# Patient Record
Sex: Male | Born: 1980
Health system: Southern US, Community
[De-identification: ages and names within clinical notes are randomized; demographics above are authoritative.]

## PROBLEM LIST (undated history)

## (undated) DIAGNOSIS — I10 Essential (primary) hypertension: Secondary | ICD-10-CM

## (undated) HISTORY — PX: OTHER SURGICAL HISTORY: SHX169

---
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
## 1999-08-11 ENCOUNTER — Emergency Department (HOSPITAL_COMMUNITY): Admission: EM | Admit: 1999-08-11 | Discharge: 1999-08-11 | Payer: Self-pay | Admitting: Emergency Medicine

## 2002-02-15 ENCOUNTER — Emergency Department (HOSPITAL_COMMUNITY): Admission: AC | Admit: 2002-02-15 | Discharge: 2002-02-15 | Payer: Self-pay

## 2002-02-15 ENCOUNTER — Encounter: Payer: Self-pay | Admitting: Emergency Medicine

## 2002-04-04 ENCOUNTER — Emergency Department (HOSPITAL_COMMUNITY): Admission: EM | Admit: 2002-04-04 | Discharge: 2002-04-04 | Payer: Self-pay | Admitting: Emergency Medicine

## 2002-04-04 ENCOUNTER — Encounter: Payer: Self-pay | Admitting: Emergency Medicine

## 2003-05-20 ENCOUNTER — Emergency Department (HOSPITAL_COMMUNITY): Admission: EM | Admit: 2003-05-20 | Discharge: 2003-05-20 | Payer: Self-pay | Admitting: Emergency Medicine

## 2005-01-19 ENCOUNTER — Emergency Department (HOSPITAL_COMMUNITY): Admission: EM | Admit: 2005-01-19 | Discharge: 2005-01-19 | Payer: Self-pay | Admitting: Emergency Medicine

## 2015-07-29 ENCOUNTER — Emergency Department (HOSPITAL_COMMUNITY)
Admission: EM | Admit: 2015-07-29 | Discharge: 2015-07-29 | Disposition: A | Discharge status: Home / Self Care | Payer: Self-pay | Attending: Emergency Medicine | Admitting: Emergency Medicine

## 2015-07-29 ENCOUNTER — Encounter (HOSPITAL_COMMUNITY): Payer: Self-pay | Admitting: Family Medicine

## 2015-07-29 DIAGNOSIS — F17200 Nicotine dependence, unspecified, uncomplicated: Secondary | ICD-10-CM | POA: Insufficient documentation

## 2015-07-29 DIAGNOSIS — I10 Essential (primary) hypertension: Secondary | ICD-10-CM | POA: Insufficient documentation

## 2015-07-29 MED ORDER — HYDROCHLOROTHIAZIDE 25 MG PO TABS
25.0000 mg | ORAL_TABLET | Freq: Once | ORAL | Status: AC
  Administered 2015-07-29: 25 mg via ORAL
  Filled 2015-07-29: qty 1

## 2015-07-29 MED ORDER — HYDROCHLOROTHIAZIDE 25 MG PO TABS: 25.0000 mg | ORAL_TABLET | Freq: Every day | ORAL | Status: DC

## 2015-07-29 NOTE — ED Provider Notes (Signed)
CSN: [id number]     Arrival date & time 07/29/15  1234 History  By signing my name below, I, Nicole Kindred, attest that this documentation has been prepared under the direction and in the presence of [address] [employer].   Electronically Signed: Nicole Kindred, ED Scribe 07/29/2015 at 1:13 PM.  Chief Complaint  Patient presents with  . Hypertension   Patient is a 35 y.o. male presenting with hypertension. The history is provided by the patient. No language interpreter was used.  Hypertension This is a recurrent problem. The current episode started more than 1 week ago. The problem occurs constantly. The problem has not changed since onset.Pertinent negatives include no chest pain, no abdominal pain, no headaches and no shortness of breath. Nothing aggravates the symptoms. Nothing relieves the symptoms. He has tried nothing for the symptoms. The treatment provided no relief.   HPI Comments: Russell Hernandez is a 35 y.o. male with a  PMHx of HTN (no longer on BP meds), who presents to the Emergency Department complaining of gradual onset hypertension, onset in the last week. Pt states he used to be on aspirin, lisinopril, and hydrochlorothiazide 3years ago but was told to stop taking them while in prison. He was just released from prison in February and hasn't established care with anyone since getting out. He noticed he started having intermittent headaches and intermittent chest tightness so he checked his BP at [facility] and it was 188/140, so he told his father who advised him to seek medical attention. He is not currently experiencing any symptoms today. No other associated symptoms noted. States that he has been eating saltier foods since getting out of prison. Hasn't tried anything for his symptoms. Pt denies headache, blurry vision, syncope, light-headedness, fevers, chills, chest pain, shortness of breath, nausea, vomiting, abdominal pain, diarrhea, constipation, dysuria, hematuria,  numbness, tingling, weakness, rash, body aches, or any other pertinent symptoms.    History reviewed. No pertinent past medical history. History reviewed. No pertinent past surgical history. History reviewed. No pertinent family history. Social History  Substance Use Topics  . Smoking status: Current Every Day Smoker  . Smokeless tobacco: None  . Alcohol Use: Yes    Review of Systems  Constitutional: Negative for fever and chills.  Eyes: Negative for visual disturbance.  Respiratory: Negative for shortness of breath.   Cardiovascular: Negative for chest pain.  Gastrointestinal: Negative for nausea, vomiting, abdominal pain, diarrhea and constipation.  Genitourinary: Negative for dysuria and hematuria.  Musculoskeletal: Negative for myalgias and arthralgias.  Skin: Negative for rash.  Allergic/Immunologic: Negative for immunocompromised state.  Neurological: Negative for syncope, weakness, light-headedness, numbness and headaches.  Psychiatric/Behavioral: Negative for confusion.   A complete 10 system review of systems was obtained and all systems are negative except as noted in the HPI and PMH.   Allergies  Review of patient's allergies indicates no known allergies.  Home Medications   Prior to Admission medications   Not on File   BP 163/127 mmHg  Pulse 79  Temp(Src) 98.6 F (37 C) (Oral)  Resp 18  Ht 6\' 1"  (1.854 m)  Wt 227 lb (102.967 kg)  BMI 29.96 kg/m2  SpO2 97% Physical Exam  Constitutional: He is oriented to person, place, and time. Vital signs are normal. He appears well-developed and well-nourished.  Non-toxic appearance. No distress.  Afebrile, nontoxic, NAD  HENT:  Head: Normocephalic and atraumatic.  Mouth/Throat: Oropharynx is clear and moist and mucous membranes are normal.  Eyes: Conjunctivae and EOM are  normal. Pupils are equal, round, and reactive to light. Right eye exhibits no discharge. Left eye exhibits no discharge.  PERRL, EOMI, no  nystagmus, no visual field deficits   Neck: Normal range of motion. Neck supple. No rigidity. Normal range of motion present.  Cardiovascular: Normal rate, regular rhythm, normal heart sounds and intact distal pulses.  Exam reveals no gallop and no friction rub.   No murmur heard. Pulmonary/Chest: Effort normal and breath sounds normal. No respiratory distress. He has no decreased breath sounds. He has no wheezes. He has no rhonchi. He has no rales.  Abdominal: Soft. Normal appearance and bowel sounds are normal. He exhibits no distension. There is no tenderness. There is no rigidity, no rebound, no guarding, no CVA tenderness, no tenderness at McBurney's point and negative Murphy's sign.  Musculoskeletal: Normal range of motion.  MAE x4 Strength and sensation grossly intact Distal pulses intact Gait steady  Neurological: He is alert and oriented to person, place, and time. He has normal strength. No cranial nerve deficit or sensory deficit. Coordination and gait normal. GCS eye subscore is 4. GCS verbal subscore is 5. GCS motor subscore is 6.  CN 2-12 grossly intact A&O x4 GCS 15 Sensation and strength intact Coordination with finger-to-nose WNL Neg pronator drift   Skin: Skin is warm, dry and intact. No rash noted.  Psychiatric: He has a normal mood and affect.  Nursing note and vitals reviewed.   ED Course  Procedures (including critical care time) DIAGNOSTIC STUDIES: Oxygen Saturation is 97% on RA, normal by my interpretation.    COORDINATION OF CARE: 1:13 PM Discussed treatment plan with pt at bedside and pt agreed to plan.  Labs Review Labs Reviewed - No data to display  Imaging Review No results found.   EKG Interpretation None      MDM   Final diagnoses:  HTN (hypertension), benign    35 y.o. male here with asymptomatic HTN. Got out of prison 3 months ago and hasn't followed up since then, was on BP meds while he was in prison but then they took him off of  them. Since getting out, has been eating saltier diet and noticed he started having increasing BPs. Asymptomatic today, although he's had some intermittent headaches. Neuro exam benign, no focal findings on exam, doubt need for emergent work up of his asymptomatic HTN. BP 163/127 here. Doubt malignant HTN or HTN urgency/emergency. Will start on low-dose HCTZ since he was previously on this. Will hold off on starting lisinopril which he also used to take, will have him f/up with [facility] in 1-2wks to establish care and for ongoing management of his HTN. Discussed DASH diet. I explained the diagnosis and have given explicit precautions to return to the ER including for any other new or worsening symptoms. The patient understands and accepts the medical plan as it's been dictated and I have answered their questions. Discharge instructions concerning home care and prescriptions have been given. The patient is STABLE and is discharged to home in good condition.   I personally performed the services described in this documentation, which was scribed in my presence. The recorded information has been reviewed and is accurate.  BP 163/127 mmHg  Pulse 79  Temp(Src) 98.6 F (37 C) (Oral)  Resp 18  Ht 6\' 1"  (1.854 m)  Wt 102.967 kg  BMI 29.96 kg/m2  SpO2 97%  Meds ordered this encounter  Medications  . hydrochlorothiazide (HYDRODIURIL) 25 MG tablet    Sig: Take 1  tablet (25 mg total) by mouth daily.    Dispense:  30 tablet    Refill:  0    Order Specific Question:  Supervising Provider    Answer:  Noemi Chapel [[id number]]      Floyd Lusignan Camprubi-Soms, PA-C 07/29/15 1326  Jola Schmidt, MD 07/29/15 1429

## 2015-07-29 NOTE — Discharge Instructions (Signed)
Eat a low-salt diet. Take blood pressure medications daily. Keep a log of your blood pressures at home, take it each morning and each night and write it down to show to the doctor when you follow up. Follow up with [facility] in the next 1-2 weeks to establish care and for ongoing management of your blood pressure. Return to the ER for changes or worsening symptoms.   Hypertension Hypertension, commonly called high blood pressure, is when the force of blood pumping through your arteries is too strong. Your arteries are the blood vessels that carry blood from your heart throughout your body. A blood pressure reading consists of a higher number over a lower number, such as 110/72. The higher number (systolic) is the pressure inside your arteries when your heart pumps. The lower number (diastolic) is the pressure inside your arteries when your heart relaxes. Ideally you want your blood pressure below 120/80. Hypertension forces your heart to work harder to pump blood. Your arteries may become narrow or stiff. Having untreated or uncontrolled hypertension can cause heart attack, stroke, kidney disease, and other problems. RISK FACTORS Some risk factors for high blood pressure are controllable. Others are not.  Risk factors you cannot control include:   Race. You may be at higher risk if you are African American.  Age. Risk increases with age.  Gender. Men are at higher risk than women before age 74 years. After age 11, women are at higher risk than men. Risk factors you can control include:  Not getting enough exercise or physical activity.  Being overweight.  Getting too much fat, sugar, calories, or salt in your diet.  Drinking too much alcohol. SIGNS AND SYMPTOMS Hypertension does not usually cause signs or symptoms. Extremely high blood pressure (hypertensive crisis) may cause headache, anxiety, shortness of breath, and nosebleed. DIAGNOSIS To check if you have hypertension,  your health care provider will measure your blood pressure while you are seated, with your arm held at the level of your heart. It should be measured at least twice using the same arm. Certain conditions can cause a difference in blood pressure between your right and left arms. A blood pressure reading that is higher than normal on one occasion does not mean that you need treatment. If it is not clear whether you have high blood pressure, you may be asked to return on a different day to have your blood pressure checked again. Or, you may be asked to monitor your blood pressure at home for 1 or more weeks. TREATMENT Treating high blood pressure includes making lifestyle changes and possibly taking medicine. Living a healthy lifestyle can help lower high blood pressure. You may need to change some of your habits. Lifestyle changes may include:  Following the DASH diet. This diet is high in fruits, vegetables, and whole grains. It is low in salt, red meat, and added sugars.  Keep your sodium intake below 2,300 mg per day.  Getting at least 30-45 minutes of aerobic exercise at least 4 times per week.  Losing weight if necessary.  Not smoking.  Limiting alcoholic beverages.  Learning ways to reduce stress. Your health care provider may prescribe medicine if lifestyle changes are not enough to get your blood pressure under control, and if one of the following is true:  You are 53-94 years of age and your systolic blood pressure is above 140.  You are 84 years of age or older, and your systolic blood pressure is above 150.  Your diastolic blood pressure is above 90.  You have diabetes, and your systolic blood pressure is over [id number] or your diastolic blood pressure is over 90.  You have kidney disease and your blood pressure is above 140/90.  You have heart disease and your blood pressure is above 140/90. Your personal target blood pressure may vary depending on your medical conditions, your  age, and other factors. HOME CARE INSTRUCTIONS  Have your blood pressure rechecked as directed by your health care provider.   Take medicines only as directed by your health care provider. Follow the directions carefully. Blood pressure medicines must be taken as prescribed. The medicine does not work as well when you skip doses. Skipping doses also puts you at risk for problems.  Do not smoke.   Monitor your blood pressure at home as directed by your health care provider. SEEK MEDICAL CARE IF:   You think you are having a reaction to medicines taken.  You have recurrent headaches or feel dizzy.  You have swelling in your ankles.  You have trouble with your vision. SEEK IMMEDIATE MEDICAL CARE IF:  You develop a severe headache or confusion.  You have unusual weakness, numbness, or feel faint.  You have severe chest or abdominal pain.  You vomit repeatedly.  You have trouble breathing. MAKE SURE YOU:   Understand these instructions.  Will watch your condition.  Will get help right away if you are not doing well or get worse.   This information is not intended to replace advice given to you by your health care provider. Make sure you discuss any questions you have with your health care provider.   Document Released: 02/28/2005 Document Revised: 07/15/2014 Document Reviewed: 12/21/2012 Elsevier Interactive Patient Education 2016 [employer].  How to Take Your Blood Pressure HOW DO I GET A BLOOD PRESSURE MACHINE?  You can buy an electronic home blood pressure machine at your local pharmacy. Insurance will sometimes cover the cost if you have a prescription.  Ask your doctor what type of machine is best for you. There are different machines for your arm and your wrist.  If you decide to buy a machine to check your blood pressure on your arm, first check the size of your arm so you can buy the right size cuff. To check the size of your arm:   Use a measuring tape  that shows both inches and centimeters.   Wrap the measuring tape around the upper-middle part of your arm. You may need someone to help you measure.   Write down your arm measurement in both inches and centimeters.   To measure your blood pressure correctly, it is important to have the right size cuff.   If your arm is up to 13 inches (up to 34 centimeters), get an adult cuff size.  If your arm is 13 to 17 inches (35 to 44 centimeters), get a large adult cuff size.    If your arm is 17 to 20 inches (45 to 52 centimeters), get an adult thigh cuff.  WHAT DO THE NUMBERS MEAN?   There are two numbers that make up your blood pressure. For example: 120/80.  The first number (120 in our example) is called the "systolic pressure." It is a measure of the pressure in your blood vessels when your heart is pumping blood.  The second number (80 in our example) is called the "diastolic pressure." It is a measure of the pressure in your blood vessels when your  heart is resting between beats.  Your doctor will tell you what your blood pressure should be. WHAT SHOULD I DO BEFORE I CHECK MY BLOOD PRESSURE?   Try to rest or relax for at least 30 minutes before you check your blood pressure.  Do not smoke.  Do not have any drinks with caffeine, such as:  Soda.  Coffee.  Tea.  Check your blood pressure in a quiet room.  Sit down and stretch out your arm on a table. Keep your arm at about the level of your heart. Let your arm relax.  Make sure that your legs are not crossed. HOW DO I CHECK MY BLOOD PRESSURE?  Follow the directions that came with your machine.  Make sure you remove any tight-fitting clothing from your arm or wrist. Wrap the cuff around your upper arm or wrist. You should be able to fit a finger between the cuff and your arm. If you cannot fit a finger between the cuff and your arm, it is too tight and should be removed and rewrapped.  Some units require you to manually  pump up the arm cuff.  Automatic units inflate the cuff when you press a button.  Cuff deflation is automatic in both models.  After the cuff is inflated, the unit measures your blood pressure and pulse. The readings are shown on a monitor. Hold still and breathe normally while the cuff is inflated.  Getting a reading takes less than a minute.  Some models store readings in a memory. Some provide a printout of readings. If your machine does not store your readings, keep a written record.  Take readings with you to your next visit with your doctor.   This information is not intended to replace advice given to you by your health care provider. Make sure you discuss any questions you have with your health care provider.   Document Released: 02/11/2008 Document Revised: 03/21/2014 Document Reviewed: 04/25/2013 Elsevier Interactive Patient Education 2016 [place] DASH stands for "Dietary Approaches to Stop Hypertension." The DASH eating plan is a healthy eating plan that has been shown to reduce high blood pressure (hypertension). Additional health benefits may include reducing the risk of type 2 diabetes mellitus, heart disease, and stroke. The DASH eating plan may also help with weight loss. WHAT DO I NEED TO KNOW ABOUT THE DASH EATING PLAN? For the DASH eating plan, you will follow these general guidelines:  Choose foods with a percent daily value for sodium of less than 5% (as listed on the food label).  Use salt-free seasonings or herbs instead of table salt or sea salt.  Check with your health care provider or pharmacist before using salt substitutes.  Eat lower-sodium products, often labeled as "lower sodium" or "no salt added."  Eat fresh foods.  Eat more vegetables, fruits, and low-fat dairy products.  Choose whole grains. Look for the word "whole" as the first word in the ingredient list.  Choose fish and skinless chicken or [place] more often than  red meat. Limit fish, poultry, and meat to 6 oz (170 g) each day.  Limit sweets, desserts, sugars, and sugary drinks.  Choose heart-healthy fats.  Limit cheese to 1 oz (28 g) per day.  Eat more home-cooked food and less restaurant, buffet, and fast food.  Limit fried foods.  Cook foods using methods other than frying.  Limit canned vegetables. If you do use them, rinse them well to decrease the sodium.  When  eating at a restaurant, ask that your food be prepared with less salt, or no salt if possible. WHAT FOODS CAN I EAT? Seek help from a dietitian for individual calorie needs. Grains Whole grain or whole wheat bread. Brown rice. Whole grain or whole wheat pasta. Quinoa, bulgur, and whole grain cereals. Low-sodium cereals. Corn or whole wheat flour tortillas. Whole grain cornbread. Whole grain crackers. Low-sodium crackers. Vegetables Fresh or frozen vegetables (raw, steamed, roasted, or grilled). Low-sodium or reduced-sodium tomato and vegetable juices. Low-sodium or reduced-sodium tomato sauce and paste. Low-sodium or reduced-sodium canned vegetables.  Fruits All fresh, canned (in natural juice), or frozen fruits. Meat and Other Protein Products Ground beef (85% or leaner), grass-fed beef, or beef trimmed of fat. Skinless chicken or [place]. Ground chicken or [place]. Pork trimmed of fat. All fish and seafood. Eggs. Dried beans, peas, or lentils. Unsalted nuts and seeds. Unsalted canned beans. Dairy Low-fat dairy products, such as skim or 1% milk, 2% or reduced-fat cheeses, low-fat ricotta or cottage cheese, or plain low-fat yogurt. Low-sodium or reduced-sodium cheeses. Fats and Oils Tub margarines without trans fats. Light or reduced-fat mayonnaise and salad dressings (reduced sodium). Avocado. Safflower, olive, or canola oils. Natural peanut or almond butter. Other Unsalted popcorn and pretzels. The items listed above may not be a complete list of recommended foods or beverages.  Contact your dietitian for more options. WHAT FOODS ARE NOT RECOMMENDED? Grains White bread. White pasta. White rice. Refined cornbread. Bagels and croissants. Crackers that contain trans fat. Vegetables Creamed or fried vegetables. Vegetables in a cheese sauce. Regular canned vegetables. Regular canned tomato sauce and paste. Regular tomato and vegetable juices. Fruits Dried fruits. Canned fruit in light or heavy syrup. Fruit juice. Meat and Other Protein Products Fatty cuts of meat. Ribs, chicken wings, bacon, sausage, bologna, salami, chitterlings, fatback, hot dogs, bratwurst, and packaged luncheon meats. Salted nuts and seeds. Canned beans with salt. Dairy Whole or 2% milk, cream, half-and-half, and cream cheese. Whole-fat or sweetened yogurt. Full-fat cheeses or blue cheese. Nondairy creamers and whipped toppings. Processed cheese, cheese spreads, or cheese curds. Condiments Onion and garlic salt, seasoned salt, table salt, and sea salt. Canned and packaged gravies. Worcestershire sauce. Tartar sauce. Barbecue sauce. Teriyaki sauce. Soy sauce, including reduced sodium. Steak sauce. Fish sauce. Oyster sauce. Cocktail sauce. Horseradish. Ketchup and mustard. Meat flavorings and tenderizers. Bouillon cubes. Hot sauce. Tabasco sauce. Marinades. Taco seasonings. Relishes. Fats and Oils Butter, stick margarine, lard, shortening, ghee, and bacon fat. Coconut, palm kernel, or palm oils. Regular salad dressings. Other Pickles and olives. Salted popcorn and pretzels. The items listed above may not be a complete list of foods and beverages to avoid. Contact your dietitian for more information. WHERE CAN I FIND MORE INFORMATION? National Heart, Lung, and Blood Institute: travelstabloid.com   This information is not intended to replace advice given to you by your health care provider. Make sure you discuss any questions you have with your health care provider.     Document Released: 02/17/2011 Document Revised: 03/21/2014 Document Reviewed: 01/02/2013 Elsevier Interactive Patient Education 2016 [place] Your High Blood Pressure Blood pressure is a measurement of how forceful your blood is pressing against the walls of the arteries. Arteries are muscular tubes within the circulatory system. Blood pressure does not stay the same. Blood pressure rises when you are active, excited, or nervous; and it lowers during sleep and relaxation. If the numbers measuring your blood pressure stay above normal most of the time, you  are at risk for health problems. High blood pressure (hypertension) is a long-term (chronic) condition in which blood pressure is elevated. A blood pressure reading is recorded as two numbers, such as 120 over 80 (or 120/80). The first, higher number is called the systolic pressure. It is a measure of the pressure in your arteries as the heart beats. The second, lower number is called the diastolic pressure. It is a measure of the pressure in your arteries as the heart relaxes between beats.  Keeping your blood pressure in a normal range is important to your overall health and prevention of health problems, such as heart disease and stroke. When your blood pressure is uncontrolled, your heart has to work harder than normal. High blood pressure is a very common condition in adults because blood pressure tends to rise with age. Men and women are equally likely to have hypertension but at different times in life. Before age 51, men are more likely to have hypertension. After 36 years of age, women are more likely to have it. Hypertension is especially common in African Americans. This condition often has no signs or symptoms. The cause of the condition is usually not known. Your caregiver can help you come up with a plan to keep your blood pressure in a normal, healthy range. BLOOD PRESSURE STAGES Blood pressure is classified into four  stages: normal, prehypertension, stage 1, and stage 2. Your blood pressure reading will be used to determine what type of treatment, if any, is necessary. Appropriate treatment options are tied to these four stages:  Normal  Systolic pressure (mm Hg): below 120.  Diastolic pressure (mm Hg): below 80. Prehypertension  Systolic pressure (mm Hg): 120 to 139.  Diastolic pressure (mm Hg): 80 to 89. Stage1  Systolic pressure (mm Hg): 140 to 159.  Diastolic pressure (mm Hg): 90 to 99. Stage2  Systolic pressure (mm Hg): 160 or above.  Diastolic pressure (mm Hg): 100 or above. RISKS RELATED TO HIGH BLOOD PRESSURE Managing your blood pressure is an important responsibility. Uncontrolled high blood pressure can lead to:  A heart attack.  A stroke.  A weakened blood vessel (aneurysm).  Heart failure.  Kidney damage.  Eye damage.  Metabolic syndrome.  Memory and concentration problems. HOW TO MANAGE YOUR BLOOD PRESSURE Blood pressure can be managed effectively with lifestyle changes and medicines (if needed). Your caregiver will help you come up with a plan to bring your blood pressure within a normal range. Your plan should include the following: Education  Read all information provided by your caregivers about how to control blood pressure.  Educate yourself on the latest guidelines and treatment recommendations. New research is always being done to further define the risks and treatments for high blood pressure. Lifestylechanges  Control your weight.  Avoid smoking.  Stay physically active.  Reduce the amount of salt in your diet.  Reduce stress.  Control any chronic conditions, such as high cholesterol or diabetes.  Reduce your alcohol intake. Medicines  Several medicines (antihypertensive medicines) are available, if needed, to bring blood pressure within a normal range. Communication  Review all the medicines you take with your caregiver because there may  be side effects or interactions.  Talk with your caregiver about your diet, exercise habits, and other lifestyle factors that may be contributing to high blood pressure.  See your caregiver regularly. Your caregiver can help you create and adjust your plan for managing high blood pressure. RECOMMENDATIONS FOR TREATMENT AND FOLLOW-UP  The following  recommendations are based on current guidelines for managing high blood pressure in nonpregnant adults. Use these recommendations to identify the proper follow-up period or treatment option based on your blood pressure reading. You can discuss these options with your caregiver.  Systolic pressure of [id number] to [id number] or diastolic pressure of 80 to 89: Follow up with your caregiver as directed.  Systolic pressure of [id number] to 0000000 or diastolic pressure of 90 to 100: Follow up with your caregiver within 2 months.  Systolic pressure above 0000000 or diastolic pressure above [id number]: Follow up with your caregiver within 1 month.  Systolic pressure above [id number] or diastolic pressure above [id number]: Consider antihypertensive therapy; follow up with your caregiver within 1 week.  Systolic pressure above [id number] or diastolic pressure above 123456: Begin antihypertensive therapy; follow up with your caregiver within 1 week.   This information is not intended to replace advice given to you by your health care provider. Make sure you discuss any questions you have with your health care provider.   Document Released: 11/23/2011 Document Reviewed: 11/23/2011 Elsevier Interactive Patient Education [employer].

## 2015-07-29 NOTE — ED Notes (Addendum)
Pt here sts that he checked his BP and it was 188/140 at [facility]. Ss some intermittent HA and family hx of HTN. sts that he was taking HTN meds 3 years ago.

## 2015-08-02 ENCOUNTER — Emergency Department (HOSPITAL_COMMUNITY)
Admission: EM | Admit: 2015-08-02 | Discharge: 2015-08-03 | Disposition: A | Discharge status: Home / Self Care | Payer: Self-pay | Attending: Emergency Medicine | Admitting: Emergency Medicine

## 2015-08-02 ENCOUNTER — Emergency Department (HOSPITAL_COMMUNITY): Payer: Self-pay

## 2015-08-02 ENCOUNTER — Encounter (HOSPITAL_COMMUNITY): Payer: Self-pay | Admitting: Emergency Medicine

## 2015-08-02 DIAGNOSIS — F17200 Nicotine dependence, unspecified, uncomplicated: Secondary | ICD-10-CM | POA: Insufficient documentation

## 2015-08-02 DIAGNOSIS — R079 Chest pain, unspecified: Secondary | ICD-10-CM

## 2015-08-02 DIAGNOSIS — Z79899 Other long term (current) drug therapy: Secondary | ICD-10-CM | POA: Insufficient documentation

## 2015-08-02 DIAGNOSIS — R05 Cough: Secondary | ICD-10-CM | POA: Insufficient documentation

## 2015-08-02 DIAGNOSIS — R0789 Other chest pain: Secondary | ICD-10-CM | POA: Insufficient documentation

## 2015-08-02 DIAGNOSIS — I10 Essential (primary) hypertension: Secondary | ICD-10-CM | POA: Insufficient documentation

## 2015-08-02 DIAGNOSIS — R110 Nausea: Secondary | ICD-10-CM | POA: Insufficient documentation

## 2015-08-02 DIAGNOSIS — R51 Headache: Secondary | ICD-10-CM | POA: Insufficient documentation

## 2015-08-02 HISTORY — DX: Essential (primary) hypertension: I10

## 2015-08-02 LAB — RAPID URINE DRUG SCREEN, HOSP PERFORMED
Amphetamines: NOT DETECTED
BENZODIAZEPINES: NOT DETECTED
Barbiturates: NOT DETECTED
COCAINE: NOT DETECTED
OPIATES: NOT DETECTED
Tetrahydrocannabinol: POSITIVE — AB

## 2015-08-02 LAB — BASIC METABOLIC PANEL
ANION GAP: 12 (ref 5–15)
BUN: 19 mg/dL (ref 6–20)
CALCIUM: 9 mg/dL (ref 8.9–10.3)
CHLORIDE: 97 mmol/L — AB (ref 101–111)
CO2: 25 mmol/L (ref 22–32)
CREATININE: 1.49 mg/dL — AB (ref 0.61–1.24)
GFR calc non Af Amer: 59 mL/min — ABNORMAL LOW (ref >60)
Glucose, Bld: 137 mg/dL — ABNORMAL HIGH (ref 65–99)
Potassium: 4.7 mmol/L (ref 3.5–5.1)
SODIUM: 134 mmol/L — AB (ref 135–145)

## 2015-08-02 LAB — CBC
HCT: 54.2 % — ABNORMAL HIGH (ref 39.0–52.0)
HEMOGLOBIN: 18.8 g/dL — AB (ref 13.0–17.0)
MCH: 30.4 pg (ref 26.0–34.0)
MCHC: 34.7 g/dL (ref 30.0–36.0)
MCV: 87.7 fL (ref 78.0–100.0)
PLATELETS: 269 10*3/uL (ref 150–400)
RBC: 6.18 MIL/uL — AB (ref 4.22–5.81)
RDW: 14.7 % (ref 11.5–15.5)
WBC: 7.7 10*3/uL (ref 4.0–10.5)

## 2015-08-02 LAB — I-STAT TROPONIN, ED: TROPONIN I, POC: 0 ng/mL (ref 0.00–0.08)

## 2015-08-02 MED ORDER — HYDROCHLOROTHIAZIDE 25 MG PO TABS
25.0000 mg | ORAL_TABLET | Freq: Once | ORAL | Status: AC
  Administered 2015-08-02: 25 mg via ORAL
  Filled 2015-08-02: qty 1

## 2015-08-02 MED ORDER — LISINOPRIL 20 MG PO TABS: 20.0000 mg | ORAL_TABLET | Freq: Every day | ORAL | Status: DC

## 2015-08-02 MED ORDER — ONDANSETRON 4 MG PO TBDP
4.0000 mg | ORAL_TABLET | Freq: Once | ORAL | Status: AC
Start: 2015-08-03 — End: 2015-08-03
  Administered 2015-08-03: 4 mg via ORAL
  Filled 2015-08-02: qty 1

## 2015-08-02 MED ORDER — ONDANSETRON HCL 4 MG/2ML IJ SOLN: 4.0000 mg | Freq: Once | INTRAMUSCULAR | Status: DC

## 2015-08-02 MED ORDER — ONDANSETRON 4 MG PO TBDP: 4.0000 mg | ORAL_TABLET | Freq: Three times a day (TID) | ORAL | Status: DC | PRN

## 2015-08-02 MED ORDER — ACETAMINOPHEN 325 MG PO TABS
650.0000 mg | ORAL_TABLET | Freq: Once | ORAL | Status: AC
  Administered 2015-08-02: 650 mg via ORAL
  Filled 2015-08-02: qty 2

## 2015-08-02 NOTE — Discharge Instructions (Signed)
Continue taking hydrochlorothiazide as prescribed. In addition take lisinopril as prescribed today. Follow up with a primary care doctor for further management of your high blood pressure.

## 2015-08-02 NOTE — ED Provider Notes (Signed)
CSN: [id number]     Arrival date & time 08/02/15  1842 History   First MD Initiated Contact with Patient 08/02/15 1957     Chief Complaint  Patient presents with  . Chest Pain   HPI Patient is a 35 year old male with past medical history of hypertension who presents with contract concern for continued hypertension despite antihypertensive medications. Patient reports that he was recently incarcerated and states that during that time he stopped taking his blood pressure medications (lisinopril and HCTZ) because his blood pressure had normalized. After discharge patient reported having intermittent spells of various symptoms including nausea, chest tightness, headaches and dizziness. He is evaluated here in the emergency department on 07/29/15 for similar symptoms and was started on low-dose hydrochlorothiazide at that time. Patient reports she's been taking his medicine as directed but continues to have blood pressures at home in the 160s over 100s. He was also advised to follow-up with primary care but he has not called or made an appointment to do this yet. Today patient experienced worsening nausea and presented to the emergency department for evaluation. Over the past few weeks he reports intermittent episodes of headaches, dizziness and chest tightness. Denies chest pain or shortness of breath at this time.  Past Medical History  Diagnosis Date  . Hypertension    History reviewed. No pertinent past surgical history. No family history on file. Social History  Substance Use Topics  . Smoking status: Current Every Day Smoker  . Smokeless tobacco: None  . Alcohol Use: Yes    Review of Systems  Constitutional: Negative for fever and chills.  HENT: Negative for congestion and rhinorrhea.   Eyes: Negative for visual disturbance.  Respiratory: Positive for cough and chest tightness.   Cardiovascular: Positive for chest pain.  Gastrointestinal: Positive for nausea. Negative for vomiting,  abdominal pain, diarrhea and constipation.  Genitourinary: Negative for dysuria and difficulty urinating.  Musculoskeletal: Negative for back pain and neck pain.  Skin: Negative for pallor and rash.  Neurological: Positive for dizziness and headaches.  Psychiatric/Behavioral: Negative for confusion.      Allergies  Review of patient's allergies indicates no known allergies.  Home Medications   Prior to Admission medications   Medication Sig Start Date End Date Taking? Authorizing Provider  hydrochlorothiazide (HYDRODIURIL) 25 MG tablet Take 1 tablet (25 mg total) by mouth daily. 07/29/15   Mercedes Camprubi-Soms, PA-C   BP 160/119 mmHg  Pulse 99  Temp(Src) 98.1 F (36.7 C) (Oral)  Resp 16  SpO2 96% Physical Exam  Constitutional: He is oriented to person, place, and time. He appears well-developed and well-nourished.  HENT:  Head: Normocephalic and atraumatic.  Eyes: EOM are normal. Pupils are equal, round, and reactive to light.  Neck: Normal range of motion. Neck supple.  Cardiovascular: Normal rate, regular rhythm and intact distal pulses.   Pulmonary/Chest: Effort normal and breath sounds normal. No respiratory distress.  Abdominal: Soft. He exhibits no distension. There is no tenderness.  Musculoskeletal: Normal range of motion. He exhibits no edema or tenderness.  Neurological: He is alert and oriented to person, place, and time.  Skin: Skin is warm and dry. No rash noted.  Psychiatric: He has a normal mood and affect.  Nursing note and vitals reviewed.   ED Course  Procedures (including critical care time) Labs Review Labs Reviewed  BASIC METABOLIC PANEL - Abnormal; Notable for the following:    Sodium 134 (*)    Chloride 97 (*)    Glucose, Bld 137 (*)  Creatinine, Ser 1.49 (*)    GFR calc non Af Amer 59 (*)    All other components within normal limits  CBC - Abnormal; Notable for the following:    RBC 6.18 (*)    Hemoglobin 18.8 (*)    HCT 54.2 (*)     All other components within normal limits  URINE RAPID DRUG SCREEN, HOSP PERFORMED - Abnormal; Notable for the following:    Tetrahydrocannabinol POSITIVE (*)    All other components within normal limits  I-STAT TROPOININ, ED    Imaging Review No results found. I have personally reviewed and evaluated these images and lab results as part of my medical decision-making.   EKG Interpretation   Date/Time:  Sunday Aug 02 2015 18:46:08 EDT Ventricular Rate:  100 PR Interval:  144 QRS Duration: 84 QT Interval:  356 QTC Calculation: 459 R Axis:   48 Text Interpretation:  Normal sinus rhythm Septal infarct , age  undetermined Abnormal ECG No old tracing to compare abnormal T wave in the  inferior leads Confirmed by MILLER  MD, BRIAN ([id number]) on 08/02/2015 7:55:51  PM      MDM   Final diagnoses:  Essential hypertension  Chest pain, unspecified chest pain type  Nausea   Patient is a 35 year old male with past medical history of hypertension who presents for continued hypertension despite starting antihypertensives 5 days ago. Present patient is afebrile, hypertensive with initial blood pressure 160/119 with normal heart and respiratory rates. Exam is benign. No JVD, heart murmur or lower extremity edema appreciated. EKG without acute ischemic changes. Initial troponin is undetectable. Labs significant for mildly elevated creatinine at 1.49 and UDS positive for THC. Patient was treated with Tylenol and Zofran with improvement in symptoms. Additional dose of hydrochlorothiazide given.   As patient is not receiving optimal blood pressure control on one agent will restart lisinopril today as patient has been on on the past. Discussed with patient that he needs to follow up with a primary care provider for further management and following of his blood pressure, as well as recheck of his labs including creatinine. Patient was discharged in stable condition. Prescription for lisinopril and small  amount of Zofran given. Patient referred to primary care clinic and reports he will follow up there for reevaluation. Strict return precautions were discussed and patient is in agreement with plan.  Patient seen and discussed with Dr. Sabra Heck, ED attending   Gibson Ramp, MD 08/03/15 [id number]  Noemi Chapel, MD 08/03/15 [phone]

## 2015-08-02 NOTE — ED Notes (Signed)
C/o intermittent pressure to L chest with sob and nausea x 3 days.  Also reports hypertension.  States he was seen here on 5/17 for same.

## 2015-08-02 NOTE — ED Provider Notes (Signed)
The patient is a 35 year old male, he does have a history of hypertension, he was recently incarcerated and states that during that time he stopped taking his medication because his blood pressure had normalized. When he was released he noted that he was having intermittent spells of symptoms including nausea, chest pains, headaches and dizziness. He does not have any of that except for nausea at this time. On exam the patient does have some hypertension, his heart sounds and lung sounds are normal, he does not have any edema or JVD and appears very well. His EKG is slightly abnormal which I suspect is related to that persistent hypertension. I discussed with the patient the treatment options and at this time he will be treated with additional antihypertensives including lisinopril in addition to the hydrochlorothiazide is and he is in agreement with this. He will also get some Zofran. The rest of his labs are unremarkable. He understands the need for very close follow-up.  I saw and evaluated the patient, reviewed the resident's note and I agree with the findings and plan.   EKG Interpretation  Date/Time:  Sunday Aug 02 2015 18:46:08 EDT Ventricular Rate:  100 PR Interval:  144 QRS Duration: 84 QT Interval:  356 QTC Calculation: 459 R Axis:   48 Text Interpretation:  Normal sinus rhythm Septal infarct , age undetermined Abnormal ECG No old tracing to compare abnormal T wave in the inferior leads Confirmed by Mahi Zabriskie  MD, Mahaila Tischer ([id number]) on 08/02/2015 7:55:51 PM      I personally interpreted the EKG as well as the resident and agree with the interpretation on the resident's chart.  Final diagnoses:  Essential hypertension  Chest pain, unspecified chest pain type  Nausea      Noemi Chapel, MD 08/03/15 [phone]

## 2015-08-02 NOTE — ED Notes (Signed)
Pt has known hypertension, states he has been taking medications as prescribed.

## 2015-08-03 NOTE — ED Notes (Signed)
Pt verbalized understanding of discharge instructions and follow up care

## 2015-12-03 ENCOUNTER — Encounter (HOSPITAL_COMMUNITY): Payer: Self-pay | Admitting: Emergency Medicine

## 2015-12-03 ENCOUNTER — Ambulatory Visit (HOSPITAL_COMMUNITY)
Admission: EM | Admit: 2015-12-03 | Discharge: 2015-12-03 | Disposition: A | Discharge status: Home / Self Care | Payer: Self-pay | Attending: Internal Medicine | Admitting: Internal Medicine

## 2015-12-03 ENCOUNTER — Ambulatory Visit (INDEPENDENT_AMBULATORY_CARE_PROVIDER_SITE_OTHER): Payer: Self-pay

## 2015-12-03 DIAGNOSIS — R0982 Postnasal drip: Secondary | ICD-10-CM

## 2015-12-03 DIAGNOSIS — J9801 Acute bronchospasm: Secondary | ICD-10-CM

## 2015-12-03 DIAGNOSIS — J069 Acute upper respiratory infection, unspecified: Secondary | ICD-10-CM

## 2015-12-03 DIAGNOSIS — B9789 Other viral agents as the cause of diseases classified elsewhere: Principal | ICD-10-CM

## 2015-12-03 MED ORDER — PHENYLEPHRINE-CHLORPHEN-DM 10-4-12.5 MG/5ML PO LIQD: 5.0000 mL | ORAL | 0 refills | Status: DC | PRN

## 2015-12-03 MED ORDER — ALBUTEROL SULFATE HFA 108 (90 BASE) MCG/ACT IN AERS: 2.0000 | INHALATION_SPRAY | RESPIRATORY_TRACT | 0 refills | Status: DC | PRN

## 2015-12-03 NOTE — ED Triage Notes (Signed)
Pt here for cold sx associated w/cough, CP, runny nose, congestion   Smokes 0.5 PPD  A&O x4... NAD

## 2015-12-03 NOTE — Discharge Instructions (Signed)
Use the albuterol inhaler 2 puffs every 4 hours as needed for cough and wheeze. Take the prescription cough medicine to help minimize drainage, cough and congestion. This may cause drowsiness. Cepacol lozenges as needed for sore throat. Ibuprofen 600 mg every 6 hours as needed for discomfort, fever or throat pain. Drink plenty of fluids and stay well-hydrated. Stop smoking.

## 2015-12-03 NOTE — ED Provider Notes (Signed)
CSN: [id number]     Arrival date & time 12/03/15  1301 History   First MD Initiated Contact with Patient 12/03/15 1347     Chief Complaint  Patient presents with  . URI   (Consider location/radiation/quality/duration/timing/severity/associated sxs/prior Treatment) 35 year old male who smokes half a pack cigarettes per day presents to the urgent care with a one-day history of cough, anterior chest pain associated with cough only, minor sore throat, PND, runny nose, upper rest were congestion. Denies earache, sore throat. He states that he believes that he had a fever sometime in the past 24 hours. Today he is afebrile.      Past Medical History:  Diagnosis Date  . Hypertension    History reviewed. No pertinent surgical history. History reviewed. No pertinent family history. Social History  Substance Use Topics  . Smoking status: Current Every Day Smoker    Packs/day: 0.50    Types: Cigarettes  . Smokeless tobacco: Never Used  . Alcohol use Yes    Review of Systems  Constitutional: Positive for activity change, fatigue and fever.  HENT: Positive for congestion, postnasal drip, rhinorrhea and sore throat. Negative for ear pain.   Eyes: Negative.   Respiratory: Positive for cough. Negative for shortness of breath.   Cardiovascular: Positive for chest pain.  Gastrointestinal: Negative.     Allergies  Onion  Home Medications   Prior to Admission medications   Medication Sig Start Date End Date Taking? Authorizing Provider  albuterol (PROVENTIL HFA;VENTOLIN HFA) 108 (90 Base) MCG/ACT inhaler Inhale 2 puffs into the lungs every 4 (four) hours as needed for wheezing or shortness of breath. 12/03/15   Janne Napoleon, NP  hydrochlorothiazide (HYDRODIURIL) 25 MG tablet Take 1 tablet (25 mg total) by mouth daily. 07/29/15   Mercedes Camprubi-Soms, PA-C  ibuprofen (ADVIL,MOTRIN) 200 MG tablet Take 200 mg by mouth every 6 (six) hours as needed for headache (pain).    Historical Provider, MD   lisinopril (PRINIVIL,ZESTRIL) 20 MG tablet Take 1 tablet (20 mg total) by mouth daily. 08/02/15 09/02/15  Gibson Ramp, MD  ondansetron (ZOFRAN ODT) 4 MG disintegrating tablet Take 1 tablet (4 mg total) by mouth every 8 (eight) hours as needed for nausea or vomiting. 08/02/15   Gibson Ramp, MD  Phenylephrine-Chlorphen-DM 12-16-10.5 MG/5ML LIQD Take 5 mLs by mouth every 4 (four) hours as needed. 12/03/15   Janne Napoleon, NP   Meds Ordered and Administered this Visit  Medications - No data to display  BP 150/94 (BP Location: Right Arm)   Pulse 77   Temp 98.1 F (36.7 C) (Oral)   Resp 12   SpO2 96%  No data found.   Physical Exam  Constitutional: He is oriented to person, place, and time. He appears well-developed and well-nourished. No distress.  HENT:  Head: Normocephalic and atraumatic.  Mouth/Throat: No oropharyngeal exudate.  Bilateral TMs are normal. Oropharynx with minimal erythema, cobblestoning and moderate amount of clear PND. Clear rhinorrhea.  Eyes: EOM are normal. Pupils are equal, round, and reactive to light.  Neck: Normal range of motion. Neck supple.  Cardiovascular: Normal rate, regular rhythm, normal heart sounds and intact distal pulses.   Pulmonary/Chest: Effort normal.  Forced expiration reveals faint distant bilateral wheeze. No crackles. No apparent increase in effort.  Musculoskeletal: Normal range of motion. He exhibits no edema.  Lymphadenopathy:    He has no cervical adenopathy.  Neurological: He is alert and oriented to person, place, and time.  Skin: Skin is warm and dry.  Psychiatric: He has a normal mood and affect.  Nursing note and vitals reviewed.   Urgent Care Course   Clinical Course    Procedures (including critical care time)  Labs Review Labs Reviewed - No data to display  Imaging Review Dg Chest 2 View  Result Date: 12/03/2015 CLINICAL DATA:  Chest pain and cough.  Fever. EXAM: CHEST  2 VIEW COMPARISON:  Aug 02, 2015 FINDINGS: There is  no edema or consolidation. The heart size and pulmonary vascularity are normal. No adenopathy. No pneumothorax. No bone lesions. IMPRESSION: No edema or consolidation. Electronically Signed   By: Lowella Grip III M.D.   On: 12/03/2015 14:32     Visual Acuity Review  Right Eye Distance:   Left Eye Distance:   Bilateral Distance:    Right Eye Near:   Left Eye Near:    Bilateral Near:         MDM   1. Viral URI with cough   2. Cough due to bronchospasm   3. PND (post-nasal drip)    Use the albuterol inhaler 2 puffs every 4 hours as needed for cough and wheeze. Take the prescription cough medicine to help minimize drainage, cough and congestion. This may cause drowsiness. Cepacol lozenges as needed for sore throat. Ibuprofen 600 mg every 6 hours as needed for discomfort, fever or throat pain. Drink plenty of fluids and stay well-hydrated. Stop smoking Meds ordered this encounter  Medications  . Phenylephrine-Chlorphen-DM 12-16-10.5 MG/5ML LIQD    Sig: Take 5 mLs by mouth every 4 (four) hours as needed.    Dispense:  120 mL    Refill:  0    Order Specific Question:   Supervising Provider    Answer:   Sherlene Shams [id number]  . albuterol (PROVENTIL HFA;VENTOLIN HFA) 108 (90 Base) MCG/ACT inhaler    Sig: Inhale 2 puffs into the lungs every 4 (four) hours as needed for wheezing or shortness of breath.    Dispense:  1 Inhaler    Refill:  0    Order Specific Question:   Supervising Provider    Answer:   Sherlene Shams [id number]       Janne Napoleon, NP 12/03/15 [phone]

## 2015-12-19 ENCOUNTER — Ambulatory Visit (HOSPITAL_COMMUNITY)
Admission: EM | Admit: 2015-12-19 | Discharge: 2015-12-19 | Disposition: A | Discharge status: Home / Self Care | Payer: Self-pay | Attending: Internal Medicine | Admitting: Internal Medicine

## 2015-12-19 ENCOUNTER — Encounter (HOSPITAL_COMMUNITY): Payer: Self-pay | Admitting: Emergency Medicine

## 2015-12-19 DIAGNOSIS — I10 Essential (primary) hypertension: Secondary | ICD-10-CM

## 2015-12-19 DIAGNOSIS — J069 Acute upper respiratory infection, unspecified: Secondary | ICD-10-CM

## 2015-12-19 MED ORDER — KETOROLAC TROMETHAMINE 60 MG/2ML IM SOLN
INTRAMUSCULAR | Status: AC
  Filled 2015-12-19: qty 2

## 2015-12-19 MED ORDER — LOSARTAN POTASSIUM 50 MG PO TABS: 50.0000 mg | ORAL_TABLET | Freq: Every day | ORAL | 2 refills | Status: DC

## 2015-12-19 MED ORDER — HYDROCHLOROTHIAZIDE 25 MG PO TABS: 25.0000 mg | ORAL_TABLET | Freq: Every day | ORAL | 2 refills | Status: DC

## 2015-12-19 MED ORDER — KETOROLAC TROMETHAMINE 60 MG/2ML IM SOLN
60.0000 mg | Freq: Once | INTRAMUSCULAR | Status: AC
  Administered 2015-12-19: 60 mg via INTRAMUSCULAR

## 2015-12-19 MED ORDER — HYDROCOD POLST-CPM POLST ER 10-8 MG/5ML PO SUER: 5.0000 mL | Freq: Two times a day (BID) | ORAL | 0 refills | Status: AC | PRN

## 2015-12-19 NOTE — ED Triage Notes (Addendum)
Reports a headache, particularly involving the back of his head.  Patient woke today with a panic feeling.  Patient reports coughing up green phlegm.

## 2015-12-19 NOTE — ED Provider Notes (Signed)
CSN: [id number]     Arrival date & time 12/19/15  1445 History   First MD Initiated Contact with Patient 12/19/15 1701     Chief Complaint  Patient presents with  . Headache  . Cough   (Consider location/radiation/quality/duration/timing/severity/associated sxs/prior Treatment) Russell Hernandez is a well-appearing 35 y.o male, with history of HTN, presents today for headache and wanting medication refills for his BP medication. He reports taking HCTZ 25mg  and was taking Lisinopril but reports being allergic to Lisinopril and would like something different. He haven't been taking his BP medications for 2 months. He believes the headache is either from the HTN or from his cold. He also endorses cold symptoms for the past 2 days with congestion, running nose, sinus pressure, sneezing and sore throat. Headache started 2 days ago as well. He has no nausea and no photosensitivity. He also denies fever, CP, SOB, blurry vision.       Past Medical History:  Diagnosis Date  . Hypertension    History reviewed. No pertinent surgical history. No family history on file. Social History  Substance Use Topics  . Smoking status: Current Every Day Smoker    Packs/day: 0.50    Types: Cigarettes  . Smokeless tobacco: Never Used  . Alcohol use Yes    Review of Systems  Constitutional: Positive for fatigue. Negative for chills and fever.  HENT: Positive for congestion, rhinorrhea, sinus pressure, sneezing and sore throat. Negative for ear pain.   Eyes: Negative for photophobia and visual disturbance.  Respiratory: Positive for cough. Negative for shortness of breath.        +productive cough  Cardiovascular: Negative for chest pain, palpitations and leg swelling.  Gastrointestinal: Negative for abdominal pain, diarrhea, nausea and vomiting.  Musculoskeletal: Positive for myalgias.  Neurological: Positive for headaches. Negative for dizziness and weakness.    Allergies  Onion  Home Medications    Prior to Admission medications   Medication Sig Start Date End Date Taking? Authorizing Provider  albuterol (PROVENTIL HFA;VENTOLIN HFA) 108 (90 Base) MCG/ACT inhaler Inhale 2 puffs into the lungs every 4 (four) hours as needed for wheezing or shortness of breath. 12/03/15   Janne Napoleon, NP  chlorpheniramine-HYDROcodone (TUSSIONEX PENNKINETIC ER) 10-8 MG/5ML SUER Take 5 mLs by mouth every 12 (twelve) hours as needed for cough. 12/19/15 12/26/15  Barry Dienes, NP  hydrochlorothiazide (HYDRODIURIL) 25 MG tablet Take 1 tablet (25 mg total) by mouth daily. 12/19/15   Barry Dienes, NP  ibuprofen (ADVIL,MOTRIN) 200 MG tablet Take 200 mg by mouth every 6 (six) hours as needed for headache (pain).    Historical Provider, MD  losartan (COZAAR) 50 MG tablet Take 1 tablet (50 mg total) by mouth daily. 12/19/15   Barry Dienes, NP  ondansetron (ZOFRAN ODT) 4 MG disintegrating tablet Take 1 tablet (4 mg total) by mouth every 8 (eight) hours as needed for nausea or vomiting. 08/02/15   Gibson Ramp, MD  Phenylephrine-Chlorphen-DM 12-16-10.5 MG/5ML LIQD Take 5 mLs by mouth every 4 (four) hours as needed. 12/03/15   Janne Napoleon, NP   Meds Ordered and Administered this Visit   Medications  ketorolac (TORADOL) injection 60 mg (60 mg Intramuscular Given 12/19/15 1738)    BP 166/99 (BP Location: Right Arm)   Pulse 65   Temp 98.6 F (37 C) (Oral)   Resp 16   Ht 6\' 1"  (1.854 m)   Wt 220 lb (99.8 kg)   SpO2 100%   BMI 29.03 kg/m  No data found.  Physical Exam  Constitutional: He is oriented to person, place, and time. He appears well-developed and well-nourished.  HENT:  Head: Normocephalic and atraumatic.  Right Ear: External ear normal.  Left Ear: External ear normal.  Nose: Nose normal.  Mouth/Throat: Oropharynx is clear and moist. No oropharyngeal exudate.  TM pearly gray bilaterally  Eyes: Conjunctivae and EOM are normal. Pupils are equal, round, and reactive to light.  Neck: Normal range of motion. Neck  supple.  Cardiovascular: Normal rate, regular rhythm and normal heart sounds.   Pulmonary/Chest: Effort normal and breath sounds normal. No respiratory distress. He has no wheezes.  Abdominal: Soft. Bowel sounds are normal. He exhibits no distension. There is no tenderness.  Neurological: He is alert and oriented to person, place, and time. No cranial nerve deficit. Coordination normal.  Skin: Skin is warm and dry.  Psychiatric: He has a normal mood and affect.  Nursing note and vitals reviewed.   Urgent Care Course   Clinical Course    Procedures (including critical care time)  Labs Review Labs Reviewed - No data to display  Imaging Review No results found.   MDM   1. Essential hypertension   2. Upper respiratory tract infection, unspecified type     Physical examination unremarkable and is neurologically intact. He is well-appearing. Patient is safe to be discharge home. His BP is elevated today. He ran out of his BP medications 2 months ago. Will refill HCTZ 25mg . Will switch Lisinopril to Losartan 50mg  daily. Patient informed to establish care with a PCP and follow up next week. Patient also have signs and symptoms are most consistent with a diagnosis of URI. Patient educated that antibiotic would not be helpful since this is a viral illness. Treatment is supportive. Patient encouraged to rest, drink plenty of fluid, use Motrin/Tylenol as needed at appropriate dose for pain/fever. Prescription cough syrup given. Tramadol 60 mg IM given for headache and headache did improved at discharge. Informed to go to ER if his headache worsen or if he starts to have SOB, CP, Blurry vision, dizziness or other new symptoms.         Barry Dienes, NP 12/19/15 [phone]

## 2016-11-26 ENCOUNTER — Emergency Department (HOSPITAL_COMMUNITY)
Admission: EM | Admit: 2016-11-26 | Discharge: 2016-11-27 | Disposition: A | Discharge status: Home / Self Care | Payer: No Typology Code available for payment source | Attending: Emergency Medicine | Admitting: Emergency Medicine

## 2016-11-26 ENCOUNTER — Emergency Department (HOSPITAL_COMMUNITY): Payer: No Typology Code available for payment source

## 2016-11-26 ENCOUNTER — Encounter (HOSPITAL_COMMUNITY): Payer: Self-pay | Admitting: Emergency Medicine

## 2016-11-26 DIAGNOSIS — I10 Essential (primary) hypertension: Secondary | ICD-10-CM | POA: Insufficient documentation

## 2016-11-26 DIAGNOSIS — Z79899 Other long term (current) drug therapy: Secondary | ICD-10-CM | POA: Diagnosis not present

## 2016-11-26 DIAGNOSIS — F17210 Nicotine dependence, cigarettes, uncomplicated: Secondary | ICD-10-CM | POA: Diagnosis not present

## 2016-11-26 DIAGNOSIS — M542 Cervicalgia: Secondary | ICD-10-CM | POA: Insufficient documentation

## 2016-11-26 MED ORDER — CYCLOBENZAPRINE HCL 10 MG PO TABS
10.0000 mg | ORAL_TABLET | Freq: Once | ORAL | Status: AC
  Administered 2016-11-26: 10 mg via ORAL
  Filled 2016-11-26: qty 1

## 2016-11-26 MED ORDER — OXYCODONE-ACETAMINOPHEN 5-325 MG PO TABS
1.0000 | ORAL_TABLET | Freq: Once | ORAL | Status: AC
  Administered 2016-11-26: 1 via ORAL
  Filled 2016-11-26: qty 1
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
## 2016-11-27 MED ORDER — OXYCODONE-ACETAMINOPHEN 5-325 MG PO TABS: 1.0000 | ORAL_TABLET | ORAL | 0 refills | Status: DC | PRN

## 2016-11-27 MED ORDER — CYCLOBENZAPRINE HCL 10 MG PO TABS: 10.0000 mg | ORAL_TABLET | Freq: Two times a day (BID) | ORAL | 0 refills | Status: DC | PRN

## 2016-11-27 NOTE — Discharge Instructions (Signed)
Your imaging today showed no evidence of acute fracture or dislocation. Please use the pain medicine and muscle relaxants to help with her symptoms.Please follow-up with a primary care physician for further management. If any symptoms change or worsen, please return to the nearest emergency department.

## 2016-11-29 ENCOUNTER — Encounter (HOSPITAL_COMMUNITY): Payer: Self-pay | Admitting: Emergency Medicine

## 2016-11-29 ENCOUNTER — Ambulatory Visit (HOSPITAL_COMMUNITY)
Admission: EM | Admit: 2016-11-29 | Discharge: 2016-11-29 | Disposition: A | Discharge status: Home / Self Care | Payer: Self-pay | Attending: Family Medicine | Admitting: Family Medicine

## 2016-11-29 DIAGNOSIS — T148XXA Other injury of unspecified body region, initial encounter: Secondary | ICD-10-CM

## 2016-11-29 MED ORDER — MELOXICAM 15 MG PO TABS: 15.0000 mg | ORAL_TABLET | Freq: Every day | ORAL | 0 refills | Status: DC

## 2016-11-29 MED ORDER — KETOROLAC TROMETHAMINE 60 MG/2ML IM SOLN
INTRAMUSCULAR | Status: AC
  Filled 2016-11-29: qty 2

## 2016-11-29 MED ORDER — KETOROLAC TROMETHAMINE 60 MG/2ML IM SOLN
60.0000 mg | Freq: Once | INTRAMUSCULAR | Status: AC
  Administered 2016-11-29: 60 mg via INTRAMUSCULAR
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
## 2016-11-29 NOTE — Discharge Instructions (Signed)
Your exam was most consistent with muscle strain. Start Mobic as directed. Flexeril as needed at night. Flexeril can make you drowsy, so do not take if you are going to drive, operate heavy machinery, or make important decisions. Ice/heat compresses as needed. This can take up to 3-4 weeks to completely resolve, but you should be feeling better each week. Follow up here or with PCP if symptoms worsen, changes for reevaluation. If experience numbness/tingling of the inner thighs, loss of bladder or bowel control, go to the emergency department for evaluation.

## 2016-12-10 ENCOUNTER — Emergency Department (HOSPITAL_COMMUNITY): Payer: Self-pay

## 2016-12-10 ENCOUNTER — Emergency Department (HOSPITAL_COMMUNITY)
Admission: EM | Admit: 2016-12-10 | Discharge: 2016-12-10 | Disposition: A | Discharge status: Home / Self Care | Payer: Self-pay | Attending: Emergency Medicine | Admitting: Emergency Medicine

## 2016-12-10 ENCOUNTER — Other Ambulatory Visit: Payer: Self-pay

## 2016-12-10 ENCOUNTER — Encounter (HOSPITAL_COMMUNITY): Payer: Self-pay | Admitting: Emergency Medicine

## 2016-12-10 DIAGNOSIS — R51 Headache: Secondary | ICD-10-CM | POA: Insufficient documentation

## 2016-12-10 DIAGNOSIS — I10 Essential (primary) hypertension: Secondary | ICD-10-CM | POA: Insufficient documentation

## 2016-12-10 DIAGNOSIS — F17210 Nicotine dependence, cigarettes, uncomplicated: Secondary | ICD-10-CM | POA: Insufficient documentation

## 2016-12-10 DIAGNOSIS — Z9114 Patient's other noncompliance with medication regimen: Secondary | ICD-10-CM | POA: Insufficient documentation

## 2016-12-10 DIAGNOSIS — Z79899 Other long term (current) drug therapy: Secondary | ICD-10-CM | POA: Insufficient documentation

## 2016-12-10 DIAGNOSIS — I160 Hypertensive urgency: Secondary | ICD-10-CM | POA: Insufficient documentation

## 2016-12-10 LAB — BASIC METABOLIC PANEL
Anion gap: 9 (ref 5–15)
BUN: 10 mg/dL (ref 6–20)
CALCIUM: 9.6 mg/dL (ref 8.9–10.3)
CO2: 25 mmol/L (ref 22–32)
CREATININE: 1.23 mg/dL (ref 0.61–1.24)
Chloride: 102 mmol/L (ref 101–111)
GFR calc Af Amer: >60 mL/min (ref >60)
GLUCOSE: 106 mg/dL — AB (ref 65–99)
Potassium: 4.1 mmol/L (ref 3.5–5.1)
SODIUM: 136 mmol/L (ref 135–145)

## 2016-12-10 LAB — I-STAT TROPONIN, ED
TROPONIN I, POC: 0.01 ng/mL (ref 0.00–0.08)
Troponin i, poc: 0.02 ng/mL (ref 0.00–0.08)

## 2016-12-10 LAB — CBC
HCT: 48.8 % (ref 39.0–52.0)
Hemoglobin: 16.4 g/dL (ref 13.0–17.0)
MCH: 29.5 pg (ref 26.0–34.0)
MCHC: 33.6 g/dL (ref 30.0–36.0)
MCV: 87.8 fL (ref 78.0–100.0)
PLATELETS: 327 10*3/uL (ref 150–400)
RBC: 5.56 MIL/uL (ref 4.22–5.81)
RDW: 14.6 % (ref 11.5–15.5)
WBC: 8.2 10*3/uL (ref 4.0–10.5)

## 2016-12-10 MED ORDER — LABETALOL HCL 5 MG/ML IV SOLN: 20.0000 mg | Freq: Once | INTRAVENOUS | Status: DC

## 2016-12-10 MED ORDER — HYDROCHLOROTHIAZIDE 25 MG PO TABS: 25.0000 mg | ORAL_TABLET | Freq: Every day | ORAL | 2 refills | Status: DC

## 2016-12-10 MED ORDER — HYDROCHLOROTHIAZIDE 25 MG PO TABS
25.0000 mg | ORAL_TABLET | Freq: Every day | ORAL | Status: DC
  Administered 2016-12-10: 25 mg via ORAL
  Filled 2016-12-10: qty 1

## 2016-12-10 MED ORDER — LISINOPRIL 10 MG PO TABS: 10.0000 mg | ORAL_TABLET | Freq: Every day | ORAL | 0 refills | Status: DC

## 2016-12-10 MED ORDER — ACETAMINOPHEN 325 MG PO TABS
650.0000 mg | ORAL_TABLET | Freq: Once | ORAL | Status: AC
  Administered 2016-12-10: 650 mg via ORAL
  Filled 2016-12-10: qty 2

## 2016-12-10 MED ORDER — LISINOPRIL 10 MG PO TABS
10.0000 mg | ORAL_TABLET | Freq: Once | ORAL | Status: AC
  Administered 2016-12-10: 10 mg via ORAL
  Filled 2016-12-10: qty 1

## 2016-12-10 MED ORDER — ONDANSETRON HCL 4 MG/2ML IJ SOLN
4.0000 mg | Freq: Once | INTRAMUSCULAR | Status: AC
  Administered 2016-12-10: 4 mg via INTRAVENOUS
  Filled 2016-12-10: qty 2

## 2016-12-10 MED ORDER — LABETALOL HCL 5 MG/ML IV SOLN
20.0000 mg | Freq: Once | INTRAVENOUS | Status: AC
  Administered 2016-12-10: 20 mg via INTRAVENOUS
  Filled 2016-12-10: qty 4

## 2016-12-10 NOTE — Discharge Instructions (Signed)
Please return to the ER if you have worsening chest pain, shortness of breath, pain radiating to your jaw, shoulder, or back, sweats or fainting. Otherwise see a primary care doctor for optimal care.

## 2016-12-10 NOTE — ED Provider Notes (Signed)
[facility] DEPT Provider Note   CSN: [id number] Arrival date & time: 12/10/16  1238     History   Chief Complaint Chief Complaint  Patient presents with  . Chest Pain    HPI Russell Hernandez is a 36 y.o. male.  HPI Pt comes in with cc of chest pain and elevated BP. Pt has hx of BP and he is not taking any meds due to lack of access to health care. Pt reports that he started having L sided dull chest pain last night that has been constant. Pt denies radiation of the pain, but he "kind of" has nausea and dib. Pt has no hx of similar pain. Pt is a smoker. When he arrived, pt was noted to have elevated BP, and the range of his BP is 180-210/120-130.  Pt also has headache. Pt has no associated nausea, vomiting, seizures, loss of consciousness or new visual complains, weakness, numbness, dizziness or gait instability. Headache started while in the waiting roo, and he thinks it is a hunger type headache.   Past Medical History:  Diagnosis Date  . Hypertension     There are no active problems to display for this patient.   No past surgical history on file.   Home Medications    Prior to Admission medications   Medication Sig Start Date End Date Taking? Authorizing Provider  albuterol (PROVENTIL HFA;VENTOLIN HFA) 108 (90 Base) MCG/ACT inhaler Inhale 2 puffs into the lungs every 4 (four) hours as needed for wheezing or shortness of breath. Patient not taking: Reported on 11/26/2016 12/03/15   Janne Napoleon, NP  cyclobenzaprine (FLEXERIL) 10 MG tablet Take 1 tablet (10 mg total) by mouth 2 (two) times daily as needed for muscle spasms. 11/27/16   Tegeler, Gwenyth Allegra, MD  hydrochlorothiazide (HYDRODIURIL) 25 MG tablet Take 1 tablet (25 mg total) by mouth daily. 12/10/16   [name], MD  ibuprofen (ADVIL,MOTRIN) 200 MG tablet Take 200 mg by mouth every 6 (six) hours as needed for headache (pain).    [provider]  lisinopril (PRINIVIL,ZESTRIL) 10 MG tablet Take 1  tablet (10 mg total) by mouth daily. 12/10/16   [name], MD  meloxicam (MOBIC) 15 MG tablet Take 1 tablet (15 mg total) by mouth daily. 11/29/16   Tasia Catchings, Amy V, PA-C  ondansetron (ZOFRAN ODT) 4 MG disintegrating tablet Take 1 tablet (4 mg total) by mouth every 8 (eight) hours as needed for nausea or vomiting. 08/02/15   Gibson Ramp, MD  oxyCODONE-acetaminophen (PERCOCET/ROXICET) 5-325 MG tablet Take 1 tablet by mouth every 4 (four) hours as needed for severe pain. 11/27/16   Tegeler, Gwenyth Allegra, MD  Phenylephrine-Chlorphen-DM 12-16-10.5 MG/5ML LIQD Take 5 mLs by mouth every 4 (four) hours as needed. 12/03/15   Janne Napoleon, NP    Family History No family history on file.  Social History Social History  Substance Use Topics  . Smoking status: Current Every Day Smoker    Packs/day: 0.50    Types: Cigarettes  . Smokeless tobacco: Never Used  . Alcohol use Yes     Allergies   Onion   Review of Systems Review of Systems  Constitutional: Negative for diaphoresis.  Respiratory: Negative for shortness of breath.   Cardiovascular: Positive for chest pain.  Gastrointestinal: Negative for abdominal pain.  Musculoskeletal: Negative for back pain.  Neurological: Positive for headaches.     Physical Exam Updated Vital Signs BP (!) 193/127   Pulse 71   Temp 98 F (36.7  C) (Oral)   Resp 19   SpO2 100%   Physical Exam  Constitutional: He is oriented to person, place, and time. He appears well-developed.  HENT:  Head: Atraumatic.  Eyes: Pupils are equal, round, and reactive to light.  Neck: Neck supple. No JVD present.  Cardiovascular: Normal rate and intact distal pulses.   Pulmonary/Chest: Effort normal.  Abdominal: Soft. There is no tenderness.  Musculoskeletal: He exhibits no edema or tenderness.  Neurological: He is alert and oriented to person, place, and time. No cranial nerve deficit. Coordination normal.  Skin: Skin is warm.  Nursing note and vitals  reviewed.    ED Treatments / Results  Labs (all labs ordered are listed, but only abnormal results are displayed) Labs Reviewed  BASIC METABOLIC PANEL - Abnormal; Notable for the following:       Result Value   Glucose, Bld 106 (*)    All other components within normal limits  CBC  I-STAT TROPONIN, ED  I-STAT TROPONIN, ED    EKG  EKG Interpretation  Date/Time:  Saturday December 10 2016 12:46:15 EDT Ventricular Rate:  90 PR Interval:  152 QRS Duration: 90 QT Interval:  374 QTC Calculation: 457 R Axis:   -21 Text Interpretation:  Normal sinus rhythm Normal ECG ST depression in the inferior leads - not new ST elevation in aVL and anterior leads -likely early repo No significant change since last tracing Reconfirmed by [name] [phone]) on 12/10/2016 4:43:21 PM       Radiology Dg Chest 2 View  Result Date: 12/10/2016 CLINICAL DATA:  36 year old male with a history of left-sided chest pain EXAM: CHEST  2 VIEW COMPARISON:  11/26/2016 FINDINGS: Cardiomediastinal silhouette unchanged in size and contour. No evidence of central vascular congestion. No pneumothorax or pleural effusion. No confluent airspace disease. Similar coarsened appearance of the interstitium. No displaced fracture. IMPRESSION: Chronic lung changes without evidence of superimposed acute cardiopulmonary disease. Electronically Signed   By: Corrie Mckusick D.O.   On: 12/10/2016 13:45    Procedures Procedures (including critical care time)  Medications Ordered in ED Medications  hydrochlorothiazide (HYDRODIURIL) tablet 25 mg (25 mg Oral Given 12/10/16 1656)  lisinopril (PRINIVIL,ZESTRIL) tablet 10 mg (10 mg Oral Given 12/10/16 1656)  acetaminophen (TYLENOL) tablet 650 mg (650 mg Oral Given 12/10/16 1656)  labetalol (NORMODYNE,TRANDATE) injection 20 mg (20 mg Intravenous Given 12/10/16 1656)  ondansetron (ZOFRAN) injection 4 mg (4 mg Intravenous Given 12/10/16 1655)     Initial Impression / Assessment and  Plan / ED Course  I have reviewed the triage vital signs and the nursing notes.  Pertinent labs & imaging results that were available during my care of the patient were reviewed by me and considered in my medical decision making (see chart for details).  Clinical Course as of Dec 10 1853  Sat Dec 10, 2016  1840 Chest pain and headache resolved on reassessment prior to discharge.  Results from the ER workup discussed with the patient face to face and all questions answered to the best of my ability.  Strict ER return precautions have been discussed, and patient is agreeing with the plan and is comfortable with the workup done and the recommendations from the ER.   [AN]    Clinical Course User Index [AN] [name], MD    Pt comes in with cc of chest pain.  Differential diagnosis includes: ACS syndrome Aortic dissection Myocarditis Pericarditis PE Pneumothorax PUD / Gastritis / Esophagitis Esophageal spasm  Pt comes in  with cc of chest pain. Chest pain is dull, non radiating and not typical for ACS. Still, we are concerned about the pain given the elevated BP and known hx of poorly controlled BP. Trops x 2 ordered. When I saw the patient, he was essentially pain free. EKG has some no new changes.  Pt also has a headache, but it is not severe, not thunder clap and there is no associated neurologic deficits. Not concerned about acute life threatning causes for headache.  HTN emergency not suspected. We will give iv labetalol 20 mg and his home oral meds.  Emailed the case management team about helping patient get follow up.  Final Clinical Impressions(s) / ED Diagnoses   Final diagnoses:  Hypertensive urgency    New Prescriptions Discharge Medication List as of 12/10/2016  5:44 PM    START taking these medications   Details  lisinopril (PRINIVIL,ZESTRIL) 10 MG tablet Take 1 tablet (10 mg total) by mouth daily., Starting Sat 12/10/2016, Print         [name], MD 12/10/16 [phone]

## 2016-12-10 NOTE — ED Triage Notes (Addendum)
Pt states left sided dull chest pain since last night. Denies radiation into extremities. Pt sorts "kinda" has nausea and shortness of breath. No acute distress noted. Pain 3/10.  Pt states "my BP has been high but I do not take anything for it." 187/130 at triage.

## 2016-12-11 ENCOUNTER — Emergency Department (HOSPITAL_COMMUNITY): Payer: Self-pay

## 2016-12-11 ENCOUNTER — Encounter (HOSPITAL_COMMUNITY): Payer: Self-pay | Admitting: Emergency Medicine

## 2016-12-11 ENCOUNTER — Other Ambulatory Visit: Payer: Self-pay

## 2016-12-11 ENCOUNTER — Emergency Department (HOSPITAL_COMMUNITY)
Admission: EM | Admit: 2016-12-11 | Discharge: 2016-12-11 | Disposition: A | Discharge status: Home / Self Care | Payer: Self-pay | Attending: Emergency Medicine | Admitting: Emergency Medicine

## 2016-12-11 DIAGNOSIS — R0789 Other chest pain: Secondary | ICD-10-CM | POA: Insufficient documentation

## 2016-12-11 DIAGNOSIS — F17210 Nicotine dependence, cigarettes, uncomplicated: Secondary | ICD-10-CM | POA: Insufficient documentation

## 2016-12-11 DIAGNOSIS — R51 Headache: Secondary | ICD-10-CM | POA: Insufficient documentation

## 2016-12-11 DIAGNOSIS — I10 Essential (primary) hypertension: Secondary | ICD-10-CM | POA: Insufficient documentation

## 2016-12-11 DIAGNOSIS — R519 Headache, unspecified: Secondary | ICD-10-CM

## 2016-12-11 DIAGNOSIS — Z79899 Other long term (current) drug therapy: Secondary | ICD-10-CM | POA: Insufficient documentation

## 2016-12-11 LAB — CBC
HEMATOCRIT: 48.2 % (ref 39.0–52.0)
HEMOGLOBIN: 16.7 g/dL (ref 13.0–17.0)
MCH: 30 pg (ref 26.0–34.0)
MCHC: 34.6 g/dL (ref 30.0–36.0)
MCV: 86.7 fL (ref 78.0–100.0)
Platelets: 336 10*3/uL (ref 150–400)
RBC: 5.56 MIL/uL (ref 4.22–5.81)
RDW: 14.4 % (ref 11.5–15.5)
WBC: 8.9 10*3/uL (ref 4.0–10.5)

## 2016-12-11 LAB — BASIC METABOLIC PANEL
ANION GAP: 13 (ref 5–15)
BUN: 9 mg/dL (ref 6–20)
CHLORIDE: 97 mmol/L — AB (ref 101–111)
CO2: 22 mmol/L (ref 22–32)
Calcium: 9.9 mg/dL (ref 8.9–10.3)
Creatinine, Ser: 1.4 mg/dL — ABNORMAL HIGH (ref 0.61–1.24)
GFR calc Af Amer: >60 mL/min (ref >60)
Glucose, Bld: 117 mg/dL — ABNORMAL HIGH (ref 65–99)
POTASSIUM: 3.8 mmol/L (ref 3.5–5.1)
SODIUM: 132 mmol/L — AB (ref 135–145)

## 2016-12-11 LAB — I-STAT TROPONIN, ED
TROPONIN I, POC: 0 ng/mL (ref 0.00–0.08)
Troponin i, poc: 0.01 ng/mL (ref 0.00–0.08)

## 2016-12-11 MED ORDER — ACETAMINOPHEN 500 MG PO TABS
500.0000 mg | ORAL_TABLET | Freq: Once | ORAL | Status: AC
  Administered 2016-12-11: 500 mg via ORAL
  Filled 2016-12-11: qty 1

## 2016-12-11 MED ORDER — GI COCKTAIL ~~LOC~~
30.0000 mL | Freq: Once | ORAL | Status: AC
  Administered 2016-12-11: 30 mL via ORAL
  Filled 2016-12-11: qty 30

## 2016-12-11 MED ORDER — OXYCODONE-ACETAMINOPHEN 5-325 MG PO TABS
ORAL_TABLET | ORAL | Status: AC
  Filled 2016-12-11: qty 1

## 2016-12-11 MED ORDER — SODIUM CHLORIDE 0.9 % IV BOLUS (SEPSIS)
1000.0000 mL | Freq: Once | INTRAVENOUS | Status: AC
  Administered 2016-12-11: 1000 mL via INTRAVENOUS

## 2016-12-11 MED ORDER — OXYCODONE-ACETAMINOPHEN 5-325 MG PO TABS
1.0000 | ORAL_TABLET | Freq: Once | ORAL | Status: AC
  Administered 2016-12-11: 1 via ORAL

## 2016-12-11 MED ORDER — LABETALOL HCL 5 MG/ML IV SOLN
20.0000 mg | Freq: Once | INTRAVENOUS | Status: AC
  Administered 2016-12-11: 20 mg via INTRAVENOUS
  Filled 2016-12-11: qty 4

## 2016-12-11 MED ORDER — METOCLOPRAMIDE HCL 5 MG/ML IJ SOLN
10.0000 mg | Freq: Once | INTRAMUSCULAR | Status: AC
  Administered 2016-12-11: 10 mg via INTRAVENOUS
  Filled 2016-12-11: qty 2

## 2016-12-11 MED ORDER — OMEPRAZOLE 20 MG PO CPDR: 20.0000 mg | DELAYED_RELEASE_CAPSULE | Freq: Every day | ORAL | 0 refills | Status: DC

## 2016-12-11 NOTE — Discharge Instructions (Signed)
Your imaging and lab work has been reassuring. Your chest pain may be related to acid reflux. Please start taking the Prilosec daily. Your blood pressure is high. Continue taking her blood pressure medicine and make healthy eating choices and exercise. Need follow-up with her primary care doctor. Return to the ED the chest pain worsens, he developed shortness of breath, worsening headache or vision changes.

## 2016-12-11 NOTE — ED Notes (Signed)
Pt. Requesting pain medication for headaches.

## 2016-12-11 NOTE — ED Notes (Signed)
Pt ambulated to restroom with steady gait.

## 2016-12-11 NOTE — ED Notes (Signed)
MD in speaking with pt, aware of BP.  No orders. Pt will be discharged,

## 2016-12-11 NOTE — ED Triage Notes (Signed)
Pt. Was here yesterday and discharged with chest pain and left arm pain. . The pain came back today and he called EMS to transport back. Paramedic gave him one Nitro - relieve the pain but gave me a headache. Pt. Took 325 mg, of ASA at home.

## 2016-12-11 NOTE — ED Provider Notes (Signed)
[facility] DEPT Provider Note   CSN: [id number] Arrival date & time: 12/11/16  1008     History   Chief Complaint Chief Complaint  Patient presents with  . Chest Pain  . Arm Pain    HPI Russell Hernandez is a 36 y.o. male.  HPI 36 year old African-American male past medical history significant for hypertension presents to the ED today with ongoing chest pain. Patient was seen at ED yesterday for same. Diagnosed with high blood pressure at that time and started on blood pressure medicine. Patient has not been taking his blood pressure medicine due to lack of access to healthcare. Patient had normal workup yesterday was discharged home. Patient states that his dull left-sided chest pain has persisted and has radiated to his left arm. States that the cp has persisted and he returned to the ED for eval. Patient presents by EMS. Patient reports the left-sided chest pain is dull in nature and intermittent. Last for a few seconds and self resolves. Not associated exertion. Denies any associated shortness of breath. Chest pain is not pleuritic. Pt states he can pin point the pain and can be sharp at times. Patient is a smoker. Patient was given nitroglycerin in route by EMS that did not help his pain. However this did cause a headache patient states in the lobby. He was offered a Percocet which has not helped his headache. Patient states the headache was gradual in onset. He does report having episode of emesis in the lobby. Denies any nausea or abdominal pain at this time. Denies any associated vision changes, lightheadedness, dizziness, weakness, numbness, gait instability. Patient states that he did take his lisinopril and HCTZ this morning at 6 AM. States he felt like his blood pressure was still elevated.  Patient denies any history of PE/DVT, prolonged immobilization, recent hospitalizations/surgeries, unilateral leg swelling or calf tenderness.  Does not have a cardiac history.  Pt  denies any fever, chill, vision changes, lightheadedness, dizziness, congestion, neck pain, sob, cough, abd pain, urinary symptoms, change in bowel habits, melena, hematochezia, lower extremity paresthesias.  Past Medical History:  Diagnosis Date  . Hypertension     There are no active problems to display for this patient.   History reviewed. No pertinent surgical history.     Home Medications    Prior to Admission medications   Medication Sig Start Date End Date Taking? Authorizing Provider  albuterol (PROVENTIL HFA;VENTOLIN HFA) 108 (90 Base) MCG/ACT inhaler Inhale 2 puffs into the lungs every 4 (four) hours as needed for wheezing or shortness of breath. Patient not taking: Reported on 11/26/2016 12/03/15   Janne Napoleon, NP  cyclobenzaprine (FLEXERIL) 10 MG tablet Take 1 tablet (10 mg total) by mouth 2 (two) times daily as needed for muscle spasms. 11/27/16   Tegeler, Gwenyth Allegra, MD  hydrochlorothiazide (HYDRODIURIL) 25 MG tablet Take 1 tablet (25 mg total) by mouth daily. 12/10/16   [name], MD  ibuprofen (ADVIL,MOTRIN) 200 MG tablet Take 200 mg by mouth every 6 (six) hours as needed for headache (pain).    [provider]  lisinopril (PRINIVIL,ZESTRIL) 10 MG tablet Take 1 tablet (10 mg total) by mouth daily. 12/10/16   [name], MD  meloxicam (MOBIC) 15 MG tablet Take 1 tablet (15 mg total) by mouth daily. 11/29/16   Tasia Catchings, Amy V, PA-C  ondansetron (ZOFRAN ODT) 4 MG disintegrating tablet Take 1 tablet (4 mg total) by mouth every 8 (eight) hours as needed for nausea or vomiting. 08/02/15  Gibson Ramp, MD  oxyCODONE-acetaminophen (PERCOCET/ROXICET) 5-325 MG tablet Take 1 tablet by mouth every 4 (four) hours as needed for severe pain. 11/27/16   Tegeler, Gwenyth Allegra, MD  Phenylephrine-Chlorphen-DM 12-16-10.5 MG/5ML LIQD Take 5 mLs by mouth every 4 (four) hours as needed. 12/03/15   Janne Napoleon, NP    Family History No family history on file.  Social History Social  History  Substance Use Topics  . Smoking status: Current Every Day Smoker    Packs/day: 0.50    Types: Cigarettes  . Smokeless tobacco: Never Used  . Alcohol use Yes     Allergies   Onion   Review of Systems Review of Systems  Constitutional: Negative for chills, diaphoresis and fever.  HENT: Negative for congestion and sore throat.   Eyes: Negative for visual disturbance.  Respiratory: Negative for cough, shortness of breath and wheezing.   Cardiovascular: Positive for chest pain. Negative for palpitations and leg swelling.  Gastrointestinal: Positive for nausea and vomiting. Negative for abdominal pain and diarrhea.  Genitourinary: Negative for dysuria, flank pain, frequency, hematuria, scrotal swelling, testicular pain and urgency.  Musculoskeletal: Negative for arthralgias and myalgias.  Skin: Negative for rash.  Neurological: Positive for headaches. Negative for dizziness, syncope, weakness, light-headedness and numbness.  Psychiatric/Behavioral: Negative for sleep disturbance. The patient is not nervous/anxious.      Physical Exam Updated Vital Signs BP (!) 198/144   Pulse 83   Temp 98.5 F (36.9 C) (Oral)   Resp (!) 22   Ht 6\' 1"  (1.854 m)   Wt 108.9 kg (240 lb)   SpO2 96%   BMI 31.66 kg/m   Physical Exam  Constitutional: He is oriented to person, place, and time. He appears well-developed and well-nourished.  Non-toxic appearance. No distress.  HENT:  Head: Normocephalic and atraumatic.  Nose: Nose normal.  Mouth/Throat: Oropharynx is clear and moist.  Eyes: Pupils are equal, round, and reactive to light. Conjunctivae and EOM are normal. Right eye exhibits no discharge. Left eye exhibits no discharge.  Neck: Normal range of motion. Neck supple. No JVD present. No tracheal deviation present.  Cardiovascular: Normal rate, regular rhythm, normal heart sounds and intact distal pulses.  Exam reveals no gallop and no friction rub.   No murmur heard. Pulses  strong and equal in all extremities. All extremities are warm to touch.  Pulmonary/Chest: Effort normal and breath sounds normal. No respiratory distress. He has no wheezes. He has no rales. He exhibits tenderness.    No hypoxia or tachypnea.  Abdominal: Soft. Bowel sounds are normal. He exhibits no distension. There is no tenderness. There is no rebound and no guarding.  Musculoskeletal: Normal range of motion.  No lower extremity edema or calf tenderness.  Lymphadenopathy:    He has no cervical adenopathy.  Neurological: He is alert and oriented to person, place, and time.  The patient is alert, attentive, and oriented x 3. Speech is clear. Cranial nerve II-VII grossly intact. Negative pronator drift. Sensation intact. Strength 5/5 in all extremities. Reflexes 2+ and symmetric at biceps, triceps, knees, and ankles. Rapid alternating movement and fine finger movements intact.    Skin: Skin is warm and dry. Capillary refill takes less than 2 seconds. He is not diaphoretic.  Psychiatric: His behavior is normal. Judgment and thought content normal.  Nursing note and vitals reviewed.    ED Treatments / Results  Labs (all labs ordered are listed, but only abnormal results are displayed) Labs Reviewed  BASIC METABOLIC PANEL -  Abnormal; Notable for the following:       Result Value   Sodium 132 (*)    Chloride 97 (*)    Glucose, Bld 117 (*)    Creatinine, Ser 1.40 (*)    All other components within normal limits  CBC  I-STAT TROPONIN, ED  I-STAT TROPONIN, ED    EKG  EKG Interpretation None       Radiology Dg Chest 2 View  Result Date: 12/10/2016 CLINICAL DATA:  35 year old male with a history of left-sided chest pain EXAM: CHEST  2 VIEW COMPARISON:  11/26/2016 FINDINGS: Cardiomediastinal silhouette unchanged in size and contour. No evidence of central vascular congestion. No pneumothorax or pleural effusion. No confluent airspace disease. Similar coarsened appearance of the  interstitium. No displaced fracture. IMPRESSION: Chronic lung changes without evidence of superimposed acute cardiopulmonary disease. Electronically Signed   By: Corrie Mckusick D.O.   On: 12/10/2016 13:45   Ct Head Wo Contrast  Result Date: 12/11/2016 CLINICAL DATA:  Headache. EXAM: CT HEAD WITHOUT CONTRAST TECHNIQUE: Contiguous axial images were obtained from the base of the skull through the vertex without intravenous contrast. COMPARISON:  None. FINDINGS: Brain: No evidence of acute infarction, hemorrhage, hydrocephalus, extra-axial collection or mass lesion/mass effect. Vascular: No hyperdense vessel or unexpected calcification. Skull: Normal. Negative for fracture or focal lesion. Sinuses/Orbits: Left maxillary and ethmoid sinusitis is noted. Other: None. IMPRESSION: Normal head CT. Electronically Signed   By: Marijo Conception, M.D.   On: 12/11/2016 14:35    Procedures Procedures (including critical care time)  Medications Ordered in ED Medications  oxyCODONE-acetaminophen (PERCOCET/ROXICET) 5-325 MG per tablet 1 tablet (1 tablet Oral Given 12/11/16 1102)  sodium chloride 0.9 % bolus 1,000 mL (1,000 mLs Intravenous New Bag/Given 12/11/16 1424)  metoCLOPramide (REGLAN) injection 10 mg (10 mg Intravenous Given 12/11/16 1421)  acetaminophen (TYLENOL) tablet 500 mg (500 mg Oral Given 12/11/16 1421)  labetalol (NORMODYNE,TRANDATE) injection 20 mg (20 mg Intravenous Given 12/11/16 1420)  gi cocktail (Maalox,Lidocaine,Donnatal) (30 mLs Oral Given 12/11/16 1421)     Initial Impression / Assessment and Plan / ED Course  I have reviewed the triage vital signs and the nursing notes.  Pertinent labs & imaging results that were available during my care of the patient were reviewed by me and considered in my medical decision making (see chart for details).     Patient presents to the ED with ongoing left-sided chest pain. Also states that his blood pressure is high. Patient arrived by EMS. Was given  nitroglycerin in route by EMS that did not help his chest pain. After receiving the nitroglycerin patient developed a headache in the lobby. Gradual in onset. Denies red flag symptoms. Denies any vision changes.  Patient is to be discharged with recommendation to follow up with PCP in regards to today's hospital visit. Chest pain is not likely of cardiac or pulmonary etiology d/t presentation, perc negative, VSS, no tracheal deviation, no JVD or new murmur, RRR, breath sounds equal bilaterally, EKG without any change from prior tracing and shows no signs of new ishcemia, negative delta troponin. Pt has cxr last night that was unremarkable.   Pt has no focal neuro deficits. Has no red flag symptoms that would be concerning for severe intracranial pathology. Ct scan of head was unremarkable. This develop after the nitro which is likely the cause.  Pt cp seems very atypical for ACS. Clinical presentation does not seem consistent with dissection. Low suspicion for PE. Pt felt much improved after  gi cocktail and reglan. Pt feels ready for d/c. States pain completely resolved.   BP remains high however, has had some improvement with iv labetalol. Pt is aware of need to continue bp meds and close pcp follow up. Pt was educated on diet changes and exercise. Counseled pt on need to stop smoking. Seems to be chronic for pt. Has been non compliant with meds in the past.   Pt was able to ambulate with normal gait. CP has resolved. Pt does have any signs of heart strain. EKG appears similar to prior. Doubt htn urgency/emergency. Mild elevation in creatine which appears close to his baseline. This appears chronic. Have dicussed with pt need to have creatine rechecked and drink plenty of fluids.    Pt has been advised to return to the ED is CP becomes exertional, associated with diaphoresis or nausea, radiates to left jaw/arm, worsens or becomes concerning in any way. Will start pt on Prilosec.   Pt is  hemodynamically stable, in NAD, & able to ambulate in the ED. Evaluation does not show pathology that would require ongoing emergent intervention or inpatient treatment. I explained the diagnosis to the patient. Pain has been managed & has no complaints prior to dc. Pt is comfortable with above plan and is stable for discharge at this time. All questions were answered prior to disposition. Strict return precautions for f/u to the ED were discussed. Encouraged follow up with PCP.   Case has been discussed with and seen by Dr. Venora Maples who agrees with the above plan to discharge.   Final Clinical Impressions(s) / ED Diagnoses   Final diagnoses:  Atypical chest pain  Nonintractable headache, unspecified chronicity pattern, unspecified headache type    New Prescriptions Discharge Medication List as of 12/11/2016  3:40 PM    START taking these medications   Details  omeprazole (PRILOSEC) 20 MG capsule Take 1 capsule (20 mg total) by mouth daily., Starting Sun 12/11/2016, Print         Doristine Devoid, PA-C 12/11/16 1558    Doristine Devoid, PA-C 12/11/16 1559    Jola Schmidt, MD 12/11/16 [id number]

## 2016-12-13 ENCOUNTER — Encounter (HOSPITAL_COMMUNITY): Payer: Self-pay

## 2016-12-13 ENCOUNTER — Emergency Department (HOSPITAL_COMMUNITY): Payer: Self-pay

## 2016-12-13 ENCOUNTER — Emergency Department (HOSPITAL_COMMUNITY)
Admission: EM | Admit: 2016-12-13 | Discharge: 2016-12-13 | Disposition: A | Discharge status: Home / Self Care | Payer: Self-pay | Attending: Emergency Medicine | Admitting: Emergency Medicine

## 2016-12-13 ENCOUNTER — Other Ambulatory Visit: Payer: Self-pay

## 2016-12-13 DIAGNOSIS — F17210 Nicotine dependence, cigarettes, uncomplicated: Secondary | ICD-10-CM | POA: Insufficient documentation

## 2016-12-13 DIAGNOSIS — R109 Unspecified abdominal pain: Secondary | ICD-10-CM

## 2016-12-13 DIAGNOSIS — I159 Secondary hypertension, unspecified: Secondary | ICD-10-CM

## 2016-12-13 LAB — I-STAT CHEM 8, ED
BUN: 27 mg/dL — ABNORMAL HIGH (ref 6–20)
CALCIUM ION: 1.24 mmol/L (ref 1.15–1.40)
Chloride: 98 mmol/L — ABNORMAL LOW (ref 101–111)
Creatinine, Ser: 1.4 mg/dL — ABNORMAL HIGH (ref 0.61–1.24)
GLUCOSE: 121 mg/dL — AB (ref 65–99)
HCT: 55 % — ABNORMAL HIGH (ref 39.0–52.0)
HEMOGLOBIN: 18.7 g/dL — AB (ref 13.0–17.0)
Potassium: 3.5 mmol/L (ref 3.5–5.1)
Sodium: 136 mmol/L (ref 135–145)
TCO2: 25 mmol/L (ref 22–32)

## 2016-12-13 LAB — URINALYSIS, ROUTINE W REFLEX MICROSCOPIC
Bacteria, UA: NONE SEEN
Bilirubin Urine: NEGATIVE
Glucose, UA: NEGATIVE mg/dL
Hgb urine dipstick: NEGATIVE
Ketones, ur: 80 mg/dL — AB
Leukocytes, UA: NEGATIVE
Nitrite: NEGATIVE
Protein, ur: 30 mg/dL — AB
Specific Gravity, Urine: 1.028 (ref 1.005–1.030)
pH: 5 (ref 5.0–8.0)

## 2016-12-13 LAB — CBC
HCT: 50.4 % (ref 39.0–52.0)
Hemoglobin: 17.4 g/dL — ABNORMAL HIGH (ref 13.0–17.0)
MCH: 30.1 pg (ref 26.0–34.0)
MCHC: 34.5 g/dL (ref 30.0–36.0)
MCV: 87 fL (ref 78.0–100.0)
PLATELETS: 353 10*3/uL (ref 150–400)
RBC: 5.79 MIL/uL (ref 4.22–5.81)
RDW: 14.4 % (ref 11.5–15.5)
WBC: 8.9 10*3/uL (ref 4.0–10.5)

## 2016-12-13 LAB — COMPREHENSIVE METABOLIC PANEL
ALT: 30 U/L (ref 17–63)
AST: 27 U/L (ref 15–41)
Albumin: 4.5 g/dL (ref 3.5–5.0)
Alkaline Phosphatase: 50 U/L (ref 38–126)
Anion gap: 14 (ref 5–15)
BUN: 22 mg/dL — ABNORMAL HIGH (ref 6–20)
CO2: 23 mmol/L (ref 22–32)
Calcium: 10.5 mg/dL — ABNORMAL HIGH (ref 8.9–10.3)
Chloride: 96 mmol/L — ABNORMAL LOW (ref 101–111)
Creatinine, Ser: 1.59 mg/dL — ABNORMAL HIGH (ref 0.61–1.24)
GFR calc Af Amer: >60 mL/min (ref >60)
GFR calc non Af Amer: 54 mL/min — ABNORMAL LOW (ref >60)
Glucose, Bld: 94 mg/dL (ref 65–99)
Potassium: 4.6 mmol/L (ref 3.5–5.1)
Sodium: 133 mmol/L — ABNORMAL LOW (ref 135–145)
Total Bilirubin: 1.5 mg/dL — ABNORMAL HIGH (ref 0.3–1.2)
Total Protein: 8.6 g/dL — ABNORMAL HIGH (ref 6.5–8.1)

## 2016-12-13 LAB — LIPASE, BLOOD: Lipase: 28 U/L (ref 11–51)

## 2016-12-13 MED ORDER — RANITIDINE HCL 150 MG PO TABS: 150.0000 mg | ORAL_TABLET | Freq: Two times a day (BID) | ORAL | 0 refills | Status: DC

## 2016-12-13 MED ORDER — IOPAMIDOL (ISOVUE-370) INJECTION 76%
INTRAVENOUS | Status: AC
  Administered 2016-12-13: 100 mL
  Filled 2016-12-13: qty 100

## 2016-12-13 MED ORDER — SODIUM CHLORIDE 0.9 % IV BOLUS (SEPSIS)
1000.0000 mL | Freq: Once | INTRAVENOUS | Status: AC
  Administered 2016-12-13: 1000 mL via INTRAVENOUS

## 2016-12-13 NOTE — ED Triage Notes (Signed)
Pt arrives to ED from home with complaints of upper abdominal pain on and off since one week ago. Pt states he took 2 tums pta with brief relief, but the pain shortly returned. Pt placed in position of comfort with bed locked and lowered, call bell in reach.

## 2016-12-13 NOTE — ED Provider Notes (Signed)
Emergency Department Provider Note   I have reviewed the triage vital signs and the nursing notes.   HISTORY  Chief Complaint Abdominal Pain (pt has been in and out of the hospital "every other day for a week") and Hypertension   HPI Russell Hernandez is a 36 y.o. male with hypertrophic tension in that is just recently started be controlled that is here for third time in the last 4 days secondary to chest and abdominal pain. States it is very sharp and burning and sometimes tearing in nature. Blood pressures of an incised 329 systolic on recent visits. The pain comes and goes and in between he is asymptomatic when he has it it is debilitating. Has been treated multiple things and sometimes a supple alleviate his pain and sometimes it does not. No syncope, vomiting but has had some nausea. No back pain. No other neurologic symptoms. No associated or modifying symptoms. No recent travels.   Past Medical History:  Diagnosis Date  . Hypertension     There are no active problems to display for this patient.   History reviewed. No pertinent surgical history.  Current Outpatient Rx  . Order #: [id number] Class: Print  . Order #: [id number] Class: Print  . Order #: [id number] Class: Print  . Order #: [id number] Class: Historical Med  . Order #: [id number] Class: Print  . Order #: [id number] Class: Normal  . Order #: [id number] Class: Print  . Order #: [id number] Class: Print  . Order #: [id number] Class: Print  . Order #: [id number] Class: Print  . Order #: [id number] Class: Print    Allergies Onion  History reviewed. No pertinent family history.  Social History Social History  Substance Use Topics  . Smoking status: Current Every Day Smoker    Packs/day: 0.50    Types: Cigarettes  . Smokeless tobacco: Never Used  . Alcohol use Yes    Review of Systems  All other systems negative except as documented in the HPI. All pertinent positives and negatives as reviewed in the  HPI. ____________________________________________   PHYSICAL EXAM:  VITAL SIGNS: ED Triage Vitals  Enc Vitals Group     BP 12/13/16 0821 (!) 164/117     Pulse Rate 12/13/16 0821 86     Resp 12/13/16 0821 16     Temp 12/13/16 0821 98.9 F (37.2 C)     Temp Source 12/13/16 0821 Oral     SpO2 12/13/16 0820 96 %     Weight 12/13/16 0823 230 lb (104.3 kg)     Height 12/13/16 0823 6\' 1"  (1.854 m)     Head Circumference --      Peak Flow --      Pain Score 12/13/16 0820 2     Pain Loc --      Pain Edu? --      Excl. in [place]? --     Constitutional: Alert and oriented. Well appearing and in no acute distress. Eyes: Conjunctivae are normal. PERRL. EOMI. Head: Atraumatic. Nose: No congestion/rhinnorhea. Mouth/Throat: Mucous membranes are moist.  Oropharynx non-erythematous. Neck: No stridor.  No meningeal signs.   Cardiovascular: Normal rate, regular rhythm. Good peripheral circulation. Grossly normal heart sounds.   Respiratory: Normal respiratory effort.  No retractions. Lungs CTAB. Gastrointestinal: Soft and nontender. No distention.  Musculoskeletal: No lower extremity tenderness nor edema. No gross deformities of extremities. Neurologic:  Normal speech and language. No gross focal neurologic deficits are appreciated.  Skin:  Skin is warm, dry and intact. No rash noted.   ____________________________________________  LABS (all labs ordered are listed, but only abnormal results are displayed)  Labs Reviewed  CBC - Abnormal; Notable for the following:       Result Value   Hemoglobin 17.4 (*)    All other components within normal limits  URINALYSIS, ROUTINE W REFLEX MICROSCOPIC - Abnormal; Notable for the following:    Ketones, ur 80 (*)    Protein, ur 30 (*)    Squamous Epithelial / LPF 0-5 (*)    All other components within normal limits  COMPREHENSIVE METABOLIC PANEL - Abnormal; Notable for the following:    Sodium 133 (*)    Chloride 96 (*)    BUN 22 (*)     Creatinine, Ser 1.59 (*)    Calcium 10.5 (*)    Total Protein 8.6 (*)    Total Bilirubin 1.5 (*)    GFR calc non Af Amer 54 (*)    All other components within normal limits  I-STAT CHEM 8, ED - Abnormal; Notable for the following:    Chloride 98 (*)    BUN 27 (*)    Creatinine, Ser 1.40 (*)    Glucose, Bld 121 (*)    Hemoglobin 18.7 (*)    HCT 55.0 (*)    All other components within normal limits  LIPASE, BLOOD   ____________________________________________  EKG   EKG Interpretation  Date/Time:  Tuesday December 13 2016 09:00:20 EDT Ventricular Rate:  86 PR Interval:    QRS Duration: 90 QT Interval:  368 QTC Calculation: 441 R Axis:   24 Text Interpretation:  Sinus rhythm Probable left atrial enlargement Abnormal R-wave progression, early transition Probable anteroseptal infarct, old Borderline ST depression, inferior leads Borderline ST elevation, lateral leads ST changes similar to multiple previous ECG's Confirmed by Merrily Pew [phone]) on 12/13/2016 9:11:05 AM Also confirmed by Merrily Pew [phone]), editor Laurena Spies [phone])  on 12/13/2016 9:58:27 AM       ____________________________________________  RADIOLOGY  Ct Angio Chest/abd/pel For Dissection W And/or Wo Contrast  Result Date: 12/13/2016 CLINICAL DATA:  Evaluate for aortic dissection. Epigastric pain and uncontrolled blood pressure. EXAM: CT ANGIOGRAPHY CHEST, ABDOMEN AND PELVIS TECHNIQUE: Multidetector CT imaging through the chest, abdomen and pelvis was performed using the standard protocol during bolus administration of intravenous contrast. Multiplanar reconstructed images and MIPs were obtained and reviewed to evaluate the vascular anatomy. CONTRAST:  100 cc of Isovue 370 COMPARISON:  None FINDINGS: CTA CHEST FINDINGS Cardiovascular: Preferential opacification of the thoracic aorta. No evidence of thoracic aortic aneurysm or dissection. Normal heart size. No pericardial effusion. Mediastinum/Nodes: No  enlarged mediastinal, hilar, or axillary lymph nodes. Thyroid gland, trachea, and esophagus demonstrate no significant findings. Lungs/Pleura: Lungs are clear. No pleural effusion or pneumothorax. Musculoskeletal: No chest wall abnormality. No acute or significant osseous findings. Review of the MIP images confirms the above findings. CTA ABDOMEN AND PELVIS FINDINGS VASCULAR Aorta: Normal caliber aorta without aneurysm, dissection, vasculitis or significant stenosis. Aortic atherosclerosis is noted. Celiac: Patent without evidence of aneurysm, dissection, vasculitis or significant stenosis. SMA: Patent without evidence of aneurysm, dissection, vasculitis or significant stenosis. Renals: Both renal arteries are patent without evidence of aneurysm, dissection, vasculitis, fibromuscular dysplasia or significant stenosis. IMA: Patent without evidence of aneurysm, dissection, vasculitis or significant stenosis. Inflow: Patent without evidence of aneurysm, dissection, vasculitis or significant stenosis. Veins: No obvious venous abnormality within the limitations of this arterial phase study. Review of the MIP images confirms the above findings. NON-VASCULAR Hepatobiliary: No focal liver abnormality is seen.  No gallstones, gallbladder wall thickening, or biliary dilatation. Pancreas: Unremarkable. No pancreatic ductal dilatation or surrounding inflammatory changes. Spleen: Normal in size without focal abnormality. Adrenals/Urinary Tract: The adrenal glands are normal. Unremarkable appearance of the kidneys. Urinary bladder appears normal. Stomach/Bowel: The stomach is unremarkable. The small bowel loops have a normal course caliber. No obstruction. The appendix is visualized and appears normal. No pathologic dilatation of the colon. Lymphatic: No enlarged upper abdominal lymph nodes. Reproductive: Prostate is unremarkable. Other: No abdominal wall hernia or abnormality. No abdominopelvic ascites. Musculoskeletal: No acute  or significant osseous findings. Review of the MIP images confirms the above findings. IMPRESSION: 1. Exam negative for aortic dissection. 2.  Aortic Atherosclerosis (ICD10-I70.0). Electronically Signed   By: Kerby Moors M.D.   On: 12/13/2016 10:55    ____________________________________________   PROCEDURES  Procedure(s) performed:   Procedures   ____________________________________________   INITIAL IMPRESSION / ASSESSMENT AND PLAN / ED COURSE  Pertinent labs & imaging results that were available during my care of the patient were reviewed by me and considered in my medical decision making (see chart for details).  Significantly elevated BP over last few visits with sharp/tearing and stabbing intermittent pain in abdomen and chest. Will eval for dissection, if negative, will need to start PPI. Also discussed with Camile with case management to help with outpatient follow up for primary care.   Workup unremarkable. Gastritis? Will tx for same.   Has appt for follow up on 10/15.   Stable for discharge.  ____________________________________________  FINAL CLINICAL IMPRESSION(S) / ED DIAGNOSES  Final diagnoses:  Secondary hypertension  Abdominal pain, unspecified abdominal location     MEDICATIONS GIVEN DURING THIS VISIT:  Medications  iopamidol (ISOVUE-370) 76 % injection (100 mLs  Contrast Given 12/13/16 1022)  sodium chloride 0.9 % bolus 1,000 mL (1,000 mLs Intravenous New Bag/Given 12/13/16 1140)     NEW OUTPATIENT MEDICATIONS STARTED DURING THIS VISIT:  New Prescriptions   RANITIDINE (ZANTAC) 150 MG TABLET    Take 1 tablet (150 mg total) by mouth 2 (two) times daily.    Note:  This document was prepared using Dragon voice recognition software and may include unintentional dictation errors.   Merrily Pew, MD 12/13/16 1311

## 2016-12-13 NOTE — Discharge Planning (Signed)
Hildagarde Holleran J. Clydene Laming, RN, BSN, [place]  [facility] set up appointment with Domenica Fail, PA-C at [facility] on 10/15 @ 2:30.  Spoke with pt at bedside and advised to please arrive 15 min early and take a picture ID and your current medications.  Pt verbalizes understanding of keeping appointment.

## 2016-12-26 ENCOUNTER — Inpatient Hospital Stay (INDEPENDENT_AMBULATORY_CARE_PROVIDER_SITE_OTHER): Payer: Self-pay | Admitting: Physician Assistant

## 2017-08-24 ENCOUNTER — Encounter (HOSPITAL_COMMUNITY): Payer: Self-pay | Admitting: Family Medicine

## 2017-08-24 ENCOUNTER — Ambulatory Visit (HOSPITAL_COMMUNITY)
Admission: EM | Admit: 2017-08-24 | Discharge: 2017-08-24 | Disposition: A | Discharge status: Home / Self Care | Payer: Self-pay | Attending: Family Medicine | Admitting: Family Medicine

## 2017-08-24 DIAGNOSIS — Z202 Contact with and (suspected) exposure to infections with a predominantly sexual mode of transmission: Secondary | ICD-10-CM | POA: Insufficient documentation

## 2017-08-24 DIAGNOSIS — Z113 Encounter for screening for infections with a predominantly sexual mode of transmission: Secondary | ICD-10-CM

## 2017-08-24 DIAGNOSIS — Z91018 Allergy to other foods: Secondary | ICD-10-CM | POA: Insufficient documentation

## 2017-08-24 DIAGNOSIS — I10 Essential (primary) hypertension: Secondary | ICD-10-CM | POA: Insufficient documentation

## 2017-08-24 DIAGNOSIS — F17210 Nicotine dependence, cigarettes, uncomplicated: Secondary | ICD-10-CM | POA: Insufficient documentation

## 2017-08-24 MED ORDER — METRONIDAZOLE 500 MG PO TABS: 500.0000 mg | ORAL_TABLET | Freq: Two times a day (BID) | ORAL | 0 refills | Status: DC

## 2017-08-24 NOTE — ED Provider Notes (Signed)
  [place]   [id number] 08/24/17 Arrival Time: 1059  ASSESSMENT & PLAN:  1. STD exposure   2. Essential hypertension    Treated empirically for trichomoniasis.  Pending: Labs Reviewed  URINE CYTOLOGY ANCILLARY ONLY   Will notify of any positive results. Instructed to refrain from sexual activity for at least seven days.  Reviewed expectations re: course of current medical issues. Questions answered. Outlined signs and symptoms indicating need for more acute intervention. Patient verbalized understanding. After Visit Summary given.   SUBJECTIVE:  Russell Hernandez is a 37 y.o. male who presents reports his wife tested + for trichomoniasis. Requests treatment. Asymptomatic. No penile discharge or rashes. Otherwise well.   ROS: As per HPI.  OBJECTIVE:  Vitals:   08/24/17 1142  BP: (!) 210/141  Pulse: 85  Resp: 18  Temp: 99 F (37.2 C)  TempSrc: Oral  SpO2: 99%     General appearance: alert, cooperative, appears stated age and no distress Throat: lips, mucosa, and tongue normal; teeth and gums normal Abdomen: soft, non-tender GU: declines Skin: warm and dry  Psychological: alert and cooperative; normal mood and affect.   Labs Reviewed  URINE CYTOLOGY ANCILLARY ONLY    Allergies  Allergen Reactions  . Onion Other (See Comments)    Sinus congestion    Past Medical History:  Diagnosis Date  . Hypertension    No family history on file. Social History   Socioeconomic History  . Marital status: Legally Separated    Spouse name: Not on file  . Number of children: Not on file  . Years of education: Not on file  . Highest education level: Not on file  Occupational History  . Not on file  Social Needs  . Financial resource strain: Not on file  . Food insecurity:    Worry: Not on file    Inability: Not on file  . Transportation needs:    Medical: Not on file    Non-medical: Not on file  Tobacco Use  . Smoking status: Current Every  Day Smoker    Packs/day: 0.50    Types: Cigarettes  . Smokeless tobacco: Never Used  Substance and Sexual Activity  . Alcohol use: Yes  . Drug use: No  . Sexual activity: Not on file  Lifestyle  . Physical activity:    Days per week: Not on file    Minutes per session: Not on file  . Stress: Not on file  Relationships  . Social connections:    Talks on phone: Not on file    Gets together: Not on file    Attends religious service: Not on file    Active member of club or organization: Not on file    Attends meetings of clubs or organizations: Not on file    Relationship status: Not on file  . Intimate partner violence:    Fear of current or ex partner: Not on file    Emotionally abused: Not on file    Physically abused: Not on file    Forced sexual activity: Not on file  Other Topics Concern  . Not on file  Social History Narrative  . Not on file          Vanessa Kick, MD 08/24/17 1326

## 2017-08-24 NOTE — ED Triage Notes (Signed)
Pt states that he may have had exposure to an STD from wife. Denies any symptoms. Wife was positive for trichmonias.

## 2017-08-25 LAB — URINE CYTOLOGY ANCILLARY ONLY
Chlamydia: NEGATIVE
NEISSERIA GONORRHEA: NEGATIVE
TRICH (WINDOWPATH): NEGATIVE

## 2019-02-05 ENCOUNTER — Encounter (HOSPITAL_COMMUNITY): Payer: Self-pay

## 2019-02-05 ENCOUNTER — Ambulatory Visit (HOSPITAL_COMMUNITY)
Admission: EM | Admit: 2019-02-05 | Discharge: 2019-02-05 | Disposition: A | Discharge status: Home / Self Care | Payer: Self-pay | Attending: Family Medicine | Admitting: Family Medicine

## 2019-02-05 ENCOUNTER — Other Ambulatory Visit: Payer: Self-pay

## 2019-02-05 DIAGNOSIS — I10 Essential (primary) hypertension: Secondary | ICD-10-CM

## 2019-02-05 DIAGNOSIS — R0602 Shortness of breath: Secondary | ICD-10-CM

## 2019-02-05 MED ORDER — CHLORTHALIDONE 25 MG PO TABS: 25.0000 mg | ORAL_TABLET | Freq: Every day | ORAL | 3 refills | Status: DC

## 2019-02-05 MED ORDER — ALBUTEROL SULFATE HFA 108 (90 BASE) MCG/ACT IN AERS: 2.0000 | INHALATION_SPRAY | RESPIRATORY_TRACT | 3 refills | Status: DC | PRN

## 2019-02-05 MED ORDER — AMLODIPINE BESYLATE 5 MG PO TABS: 10.0000 mg | ORAL_TABLET | Freq: Every day | ORAL | 3 refills | Status: DC

## 2019-02-05 NOTE — ED Provider Notes (Signed)
[place]    CSN: [id number] Arrival date & time: 02/05/19  1155      History   Chief Complaint Chief Complaint  Patient presents with  . Shortness of Breath    HPI Russell Hernandez is a 38 y.o. male.   HPI  Past Medical History:  Diagnosis Date  . Hypertension     There are no active problems to display for this patient.   History reviewed. No pertinent surgical history.     Home Medications    Prior to Admission medications   Medication Sig Start Date End Date Taking? Authorizing Provider  albuterol (VENTOLIN HFA) 108 (90 Base) MCG/ACT inhaler Inhale 2 puffs into the lungs every 4 (four) hours as needed for wheezing or shortness of breath. 02/05/19   Robyn Haber, MD  amLODipine (NORVASC) 5 MG tablet Take 2 tablets (10 mg total) by mouth daily. 02/05/19   Robyn Haber, MD  chlorthalidone (HYGROTON) 25 MG tablet Take 1 tablet (25 mg total) by mouth daily. 02/05/19   Robyn Haber, MD  ibuprofen (ADVIL,MOTRIN) 200 MG tablet Take 200 mg by mouth every 6 (six) hours as needed for headache (pain).    [provider]  hydrochlorothiazide (HYDRODIURIL) 25 MG tablet Take 1 tablet (25 mg total) by mouth daily. 12/10/16 02/05/19  [name], MD  lisinopril (PRINIVIL,ZESTRIL) 10 MG tablet Take 1 tablet (10 mg total) by mouth daily. 12/10/16 02/05/19  [name], MD  omeprazole (PRILOSEC) 20 MG capsule Take 1 capsule (20 mg total) by mouth daily. 12/11/16 02/05/19  Doristine Devoid, PA-C  ranitidine (ZANTAC) 150 MG tablet Take 1 tablet (150 mg total) by mouth 2 (two) times daily. 12/13/16 02/05/19  [name], [name], MD    Family History Family History  Family history unknown: Yes    Social History Social History   Tobacco Use  . Smoking status: Current Every Day Smoker    Packs/day: 0.50    Types: Cigarettes  . Smokeless tobacco: Never Used  Substance Use Topics  . Alcohol use: Yes  . Drug use: No     Allergies    Onion   Review of Systems Review of Systems  Respiratory: Positive for shortness of breath.   Gastrointestinal: Positive for abdominal pain.  All other systems reviewed and are negative.    Physical Exam Triage Vital Signs ED Triage Vitals  Enc Vitals Group     BP 02/05/19 1258 (!) 228/156     Pulse Rate 02/05/19 1258 95     Resp 02/05/19 1258 16     Temp 02/05/19 1258 98.5 F (36.9 C)     Temp Source 02/05/19 1258 Oral     SpO2 02/05/19 1258 96 %     Weight --      Height --      Head Circumference --      Peak Flow --      Pain Score 02/05/19 1300 0     Pain Loc --      Pain Edu? --      Excl. in [place]? --    No data found.  Updated Vital Signs BP (!) 228/156 (BP Location: Left Arm) Comment: has not taken medication in 2 months  Pulse 95   Temp 98.5 F (36.9 C) (Oral)   Resp 16   SpO2 96%    Physical Exam Vitals signs and nursing note reviewed.  Constitutional:      General: He is not in acute distress.  Appearance: He is well-developed and normal weight. He is not ill-appearing or toxic-appearing.  HENT:     Head: Normocephalic.  Neck:     Musculoskeletal: Normal range of motion and neck supple.  Cardiovascular:     Rate and Rhythm: Normal rate and regular rhythm.  Pulmonary:     Effort: Pulmonary effort is normal.     Breath sounds: Normal breath sounds.  Chest:     Chest wall: No tenderness or crepitus.  Abdominal:     Palpations: Abdomen is soft. There is no mass.     Tenderness: There is no abdominal tenderness.  Musculoskeletal: Normal range of motion.  Skin:    General: Skin is warm and dry.  Neurological:     General: No focal deficit present.     Mental Status: He is alert.  Psychiatric:        Mood and Affect: Mood normal.        Behavior: Behavior normal.      UC Treatments / Results  Labs (all labs ordered are listed, but only abnormal results are displayed) Labs Reviewed - No data to display  EKG   Radiology No results  found.  Procedures Procedures (including critical care time)  Medications Ordered in UC Medications - No data to display  Initial Impression / Assessment and Plan / UC Course  I have reviewed the triage vital signs and the nursing notes.  Pertinent labs & imaging results that were available during my care of the patient were reviewed by me and considered in my medical decision making (see chart for details).    Final Clinical Impressions(s) / UC Diagnoses   Final diagnoses:  Essential hypertension  Shortness of breath   Discharge Instructions   None    ED Prescriptions    Medication Sig Dispense Auth. Provider   chlorthalidone (HYGROTON) 25 MG tablet Take 1 tablet (25 mg total) by mouth daily. 90 tablet Robyn Haber, MD   amLODipine (NORVASC) 5 MG tablet Take 2 tablets (10 mg total) by mouth daily. 90 tablet Robyn Haber, MD   albuterol (VENTOLIN HFA) 108 (90 Base) MCG/ACT inhaler Inhale 2 puffs into the lungs every 4 (four) hours as needed for wheezing or shortness of breath. 6.7 g Robyn Haber, MD     I have reviewed the PDMP during this encounter.   Robyn Haber, MD 02/05/19 1314

## 2019-02-05 NOTE — ED Triage Notes (Signed)
Pt presents with shortness of breath X 3 weeks

## 2019-05-19 ENCOUNTER — Emergency Department (HOSPITAL_BASED_OUTPATIENT_CLINIC_OR_DEPARTMENT_OTHER): Payer: Self-pay

## 2019-05-19 ENCOUNTER — Encounter (HOSPITAL_BASED_OUTPATIENT_CLINIC_OR_DEPARTMENT_OTHER): Payer: Self-pay | Admitting: Emergency Medicine

## 2019-05-19 ENCOUNTER — Other Ambulatory Visit: Payer: Self-pay

## 2019-05-19 ENCOUNTER — Inpatient Hospital Stay (HOSPITAL_BASED_OUTPATIENT_CLINIC_OR_DEPARTMENT_OTHER)
Admission: EM | Admit: 2019-05-19 | Discharge: 2019-05-25 | DRG: 304 | Disposition: A | Discharge status: Home / Self Care | Payer: Self-pay | Attending: Student | Admitting: Student

## 2019-05-19 DIAGNOSIS — I161 Hypertensive emergency: Principal | ICD-10-CM | POA: Diagnosis present

## 2019-05-19 DIAGNOSIS — I130 Hypertensive heart and chronic kidney disease with heart failure and stage 1 through stage 4 chronic kidney disease, or unspecified chronic kidney disease: Secondary | ICD-10-CM | POA: Diagnosis present

## 2019-05-19 DIAGNOSIS — Z20822 Contact with and (suspected) exposure to covid-19: Secondary | ICD-10-CM | POA: Diagnosis present

## 2019-05-19 DIAGNOSIS — R200 Anesthesia of skin: Secondary | ICD-10-CM | POA: Diagnosis present

## 2019-05-19 DIAGNOSIS — G4733 Obstructive sleep apnea (adult) (pediatric): Secondary | ICD-10-CM | POA: Diagnosis present

## 2019-05-19 DIAGNOSIS — Z83438 Family history of other disorder of lipoprotein metabolism and other lipidemia: Secondary | ICD-10-CM

## 2019-05-19 DIAGNOSIS — N179 Acute kidney failure, unspecified: Secondary | ICD-10-CM | POA: Diagnosis present

## 2019-05-19 DIAGNOSIS — E785 Hyperlipidemia, unspecified: Secondary | ICD-10-CM | POA: Diagnosis present

## 2019-05-19 DIAGNOSIS — I5041 Acute combined systolic (congestive) and diastolic (congestive) heart failure: Secondary | ICD-10-CM | POA: Diagnosis not present

## 2019-05-19 DIAGNOSIS — I119 Hypertensive heart disease without heart failure: Secondary | ICD-10-CM | POA: Diagnosis present

## 2019-05-19 DIAGNOSIS — F17210 Nicotine dependence, cigarettes, uncomplicated: Secondary | ICD-10-CM | POA: Diagnosis present

## 2019-05-19 DIAGNOSIS — Z79899 Other long term (current) drug therapy: Secondary | ICD-10-CM

## 2019-05-19 DIAGNOSIS — I160 Hypertensive urgency: Secondary | ICD-10-CM | POA: Diagnosis present

## 2019-05-19 DIAGNOSIS — Z9109 Other allergy status, other than to drugs and biological substances: Secondary | ICD-10-CM

## 2019-05-19 DIAGNOSIS — E876 Hypokalemia: Secondary | ICD-10-CM | POA: Diagnosis present

## 2019-05-19 DIAGNOSIS — F1290 Cannabis use, unspecified, uncomplicated: Secondary | ICD-10-CM | POA: Diagnosis present

## 2019-05-19 DIAGNOSIS — R7303 Prediabetes: Secondary | ICD-10-CM | POA: Diagnosis present

## 2019-05-19 DIAGNOSIS — I43 Cardiomyopathy in diseases classified elsewhere: Secondary | ICD-10-CM | POA: Diagnosis present

## 2019-05-19 DIAGNOSIS — I10 Essential (primary) hypertension: Secondary | ICD-10-CM

## 2019-05-19 DIAGNOSIS — F419 Anxiety disorder, unspecified: Secondary | ICD-10-CM | POA: Diagnosis present

## 2019-05-19 DIAGNOSIS — N1831 Chronic kidney disease, stage 3a: Secondary | ICD-10-CM | POA: Diagnosis present

## 2019-05-19 DIAGNOSIS — G459 Transient cerebral ischemic attack, unspecified: Secondary | ICD-10-CM | POA: Diagnosis present

## 2019-05-19 DIAGNOSIS — G4730 Sleep apnea, unspecified: Secondary | ICD-10-CM | POA: Diagnosis present

## 2019-05-19 HISTORY — DX: Hypertensive urgency: I16.0

## 2019-05-19 HISTORY — DX: Hypertensive heart disease without heart failure: I11.9

## 2019-05-19 LAB — URINALYSIS, ROUTINE W REFLEX MICROSCOPIC
Bilirubin Urine: NEGATIVE
Glucose, UA: NEGATIVE mg/dL
Ketones, ur: NEGATIVE mg/dL
Leukocytes,Ua: NEGATIVE
Nitrite: NEGATIVE
Protein, ur: 100 mg/dL — AB
Specific Gravity, Urine: 1.02 (ref 1.005–1.030)
pH: 6 (ref 5.0–8.0)

## 2019-05-19 LAB — CBC WITH DIFFERENTIAL/PLATELET
Abs Immature Granulocytes: 0.03 10*3/uL (ref 0.00–0.07)
Basophils Absolute: 0.1 10*3/uL (ref 0.0–0.1)
Basophils Relative: 1 %
Eosinophils Absolute: 0.1 10*3/uL (ref 0.0–0.5)
Eosinophils Relative: 1 %
HCT: 41.5 % (ref 39.0–52.0)
Hemoglobin: 14 g/dL (ref 13.0–17.0)
Immature Granulocytes: 0 %
Lymphocytes Relative: 29 %
Lymphs Abs: 2.8 10*3/uL (ref 0.7–4.0)
MCH: 28.1 pg (ref 26.0–34.0)
MCHC: 33.7 g/dL (ref 30.0–36.0)
MCV: 83.2 fL (ref 80.0–100.0)
Monocytes Absolute: 0.7 10*3/uL (ref 0.1–1.0)
Monocytes Relative: 8 %
Neutro Abs: 6 10*3/uL (ref 1.7–7.7)
Neutrophils Relative %: 61 %
Platelets: 564 10*3/uL — ABNORMAL HIGH (ref 150–400)
RBC: 4.99 MIL/uL (ref 4.22–5.81)
RDW: 16.1 % — ABNORMAL HIGH (ref 11.5–15.5)
WBC: 9.8 10*3/uL (ref 4.0–10.5)
nRBC: 0 % (ref 0.0–0.2)

## 2019-05-19 LAB — BASIC METABOLIC PANEL
Anion gap: 12 (ref 5–15)
Anion gap: 10 (ref 5–15)
BUN: 19 mg/dL (ref 6–20)
BUN: 17 mg/dL (ref 6–20)
CO2: 29 mmol/L (ref 22–32)
CO2: 29 mmol/L (ref 22–32)
Calcium: 9.5 mg/dL (ref 8.9–10.3)
Calcium: 9.1 mg/dL (ref 8.9–10.3)
Chloride: 92 mmol/L — ABNORMAL LOW (ref 98–111)
Chloride: 95 mmol/L — ABNORMAL LOW (ref 98–111)
Creatinine, Ser: 1.72 mg/dL — ABNORMAL HIGH (ref 0.61–1.24)
Creatinine, Ser: 1.76 mg/dL — ABNORMAL HIGH (ref 0.61–1.24)
GFR calc Af Amer: 57 mL/min — ABNORMAL LOW (ref >60)
GFR calc Af Amer: 55 mL/min — ABNORMAL LOW (ref >60)
GFR calc non Af Amer: 49 mL/min — ABNORMAL LOW (ref >60)
GFR calc non Af Amer: 48 mL/min — ABNORMAL LOW (ref >60)
Glucose, Bld: 108 mg/dL — ABNORMAL HIGH (ref 70–99)
Glucose, Bld: 114 mg/dL — ABNORMAL HIGH (ref 70–99)
Potassium: 2.6 mmol/L — CL (ref 3.5–5.1)
Potassium: 2.6 mmol/L — CL (ref 3.5–5.1)
Sodium: 133 mmol/L — ABNORMAL LOW (ref 135–145)
Sodium: 134 mmol/L — ABNORMAL LOW (ref 135–145)

## 2019-05-19 LAB — URINALYSIS, MICROSCOPIC (REFLEX)

## 2019-05-19 LAB — PROTIME-INR
INR: 0.9 (ref 0.8–1.2)
Prothrombin Time: 12.5 seconds (ref 11.4–15.2)

## 2019-05-19 LAB — RAPID URINE DRUG SCREEN, HOSP PERFORMED
Amphetamines: NOT DETECTED
Barbiturates: NOT DETECTED
Benzodiazepines: NOT DETECTED
Cocaine: NOT DETECTED
Opiates: NOT DETECTED
Tetrahydrocannabinol: POSITIVE — AB

## 2019-05-19 LAB — CBG MONITORING, ED: Glucose-Capillary: 121 mg/dL — ABNORMAL HIGH (ref 70–99)

## 2019-05-19 MED ORDER — POTASSIUM CHLORIDE CRYS ER 20 MEQ PO TBCR
40.0000 meq | EXTENDED_RELEASE_TABLET | Freq: Once | ORAL | Status: AC
  Administered 2019-05-19: 40 meq via ORAL
  Filled 2019-05-19: qty 2

## 2019-05-19 MED ORDER — LABETALOL HCL 5 MG/ML IV SOLN
20.0000 mg | Freq: Once | INTRAVENOUS | Status: AC
  Administered 2019-05-19: 20 mg via INTRAVENOUS
  Filled 2019-05-19: qty 4

## 2019-05-19 MED ORDER — POTASSIUM CHLORIDE 10 MEQ/100ML IV SOLN
10.0000 meq | INTRAVENOUS | Status: AC
  Administered 2019-05-19 (×2): 10 meq via INTRAVENOUS
  Filled 2019-05-19: qty 100

## 2019-05-19 MED ORDER — POTASSIUM CHLORIDE 10 MEQ/100ML IV SOLN
10.0000 meq | INTRAVENOUS | Status: AC
  Administered 2019-05-19 – 2019-05-20 (×3): 10 meq via INTRAVENOUS
  Filled 2019-05-19 (×3): qty 100

## 2019-05-19 NOTE — ED Notes (Signed)
[facility] notified (Jaime) - patient ready for transport 

## 2019-05-19 NOTE — ED Triage Notes (Signed)
Pt c/o numbness to entire right side of body for 2-3 minutes onset 12:00 this afternoon. Pt reports he was walking at the time.

## 2019-05-19 NOTE — ED Provider Notes (Signed)
[facility] EMERGENCY DEPARTMENT Provider Note   CSN: [id number] Arrival date & time: 05/19/19  1552     History Chief Complaint  Patient presents with  . Numbness    Russell Hernandez is a 39 y.o. male.  HPI Patient with history of HTN reports he was in his normal state of health until around noon today when he reports sudden onset of numbness on the right side of his body. He reports his arm and leg felt tingling like it had fallen asleep but his face and tongue were completely numb like he had been to the dentist. He was alone at the time, but denies any facial droop, slurred speech, arm or leg weakness. He reports the symptoms lasted for a few minutes then resolved without intervention. He did an online stroke screening he had searched and did not have any deficits then. He reports he has been taking his BP meds although he misses days from time to time.     Past Medical History:  Diagnosis Date  . Hypertension     Patient Active Problem List   Diagnosis Date Noted  . Hypertensive urgency 05/19/2019    History reviewed. No pertinent surgical history.     Family History  Family history unknown: Yes    Social History   Tobacco Use  . Smoking status: Current Every Day Smoker    Packs/day: 0.50    Types: Cigarettes  . Smokeless tobacco: Never Used  Substance Use Topics  . Alcohol use: Yes    Comment: occ  . Drug use: Yes    Types: Marijuana    Home Medications Prior to Admission medications   Medication Sig Start Date End Date Taking? Authorizing Provider  albuterol (VENTOLIN HFA) 108 (90 Base) MCG/ACT inhaler Inhale 2 puffs into the lungs every 4 (four) hours as needed for wheezing or shortness of breath. 02/05/19   Robyn Haber, MD  amLODipine (NORVASC) 5 MG tablet Take 2 tablets (10 mg total) by mouth daily. 02/05/19   Robyn Haber, MD  chlorthalidone (HYGROTON) 25 MG tablet Take 1 tablet (25 mg total) by mouth daily. 02/05/19    Robyn Haber, MD  ibuprofen (ADVIL,MOTRIN) 200 MG tablet Take 200 mg by mouth every 6 (six) hours as needed for headache (pain).    [provider]  hydrochlorothiazide (HYDRODIURIL) 25 MG tablet Take 1 tablet (25 mg total) by mouth daily. 12/10/16 02/05/19  [name], MD  lisinopril (PRINIVIL,ZESTRIL) 10 MG tablet Take 1 tablet (10 mg total) by mouth daily. 12/10/16 02/05/19  [name], MD  omeprazole (PRILOSEC) 20 MG capsule Take 1 capsule (20 mg total) by mouth daily. 12/11/16 02/05/19  Doristine Devoid, PA-C  ranitidine (ZANTAC) 150 MG tablet Take 1 tablet (150 mg total) by mouth 2 (two) times daily. 12/13/16 02/05/19  [name], [name], MD    Allergies    Onion  Review of Systems   Review of Systems  Constitutional: Negative for fever.  HENT: Negative for congestion and sore throat.   Respiratory: Negative for cough and shortness of breath.   Cardiovascular: Negative for chest pain.  Gastrointestinal: Negative for abdominal pain, diarrhea, nausea and vomiting.  Genitourinary: Negative for dysuria.  Musculoskeletal: Negative for myalgias.  Skin: Negative for rash.  Neurological: Positive for numbness. Negative for dizziness, facial asymmetry, speech difficulty, weakness and headaches.  Psychiatric/Behavioral: Negative for behavioral problems.    Physical Exam Updated Vital Signs BP (!) 208/133 (BP Location: Left Arm)   Pulse 84  Temp 98.2 F (36.8 C) (Oral)   Resp 18   Ht 6\' 1"  (1.854 m)   Wt 99.3 kg   SpO2 98%   BMI 28.89 kg/m   Physical Exam Constitutional:      Appearance: Normal appearance.  HENT:     Head: Normocephalic and atraumatic.     Nose: Nose normal.     Mouth/Throat:     Mouth: Mucous membranes are moist.  Eyes:     Extraocular Movements: Extraocular movements intact.     Conjunctiva/sclera: Conjunctivae normal.  Cardiovascular:     Rate and Rhythm: Normal rate.  Pulmonary:     Effort: Pulmonary effort is normal.     Breath  sounds: Normal breath sounds.  Abdominal:     General: Abdomen is flat.     Palpations: Abdomen is soft.     Tenderness: There is no abdominal tenderness.  Musculoskeletal:        General: No swelling. Normal range of motion.     Cervical back: Neck supple.  Skin:    General: Skin is warm and dry.  Neurological:     General: No focal deficit present.     Mental Status: He is alert and oriented to person, place, and time.     Cranial Nerves: No cranial nerve deficit.     Sensory: No sensory deficit.     Motor: No weakness.     Coordination: Coordination normal.     Gait: Gait normal.     Deep Tendon Reflexes: Reflexes normal.  Psychiatric:        Mood and Affect: Mood normal.     ED Results / Procedures / Treatments   Labs (all labs ordered are listed, but only abnormal results are displayed) Labs Reviewed  BASIC METABOLIC PANEL - Abnormal; Notable for the following components:      Result Value   Sodium 133 (*)    Potassium 2.6 (*)    Chloride 92 (*)    Glucose, Bld 108 (*)    Creatinine, Ser 1.72 (*)    GFR calc non Af Amer 49 (*)    GFR calc Af Amer 57 (*)    All other components within normal limits  CBC WITH DIFFERENTIAL/PLATELET - Abnormal; Notable for the following components:   RDW 16.1 (*)    Platelets 564 (*)    All other components within normal limits  URINALYSIS, ROUTINE W REFLEX MICROSCOPIC - Abnormal; Notable for the following components:   Hgb urine dipstick SMALL (*)    Protein, ur 100 (*)    All other components within normal limits  RAPID URINE DRUG SCREEN, HOSP PERFORMED - Abnormal; Notable for the following components:   Tetrahydrocannabinol POSITIVE (*)    All other components within normal limits  URINALYSIS, MICROSCOPIC (REFLEX) - Abnormal; Notable for the following components:   Bacteria, UA FEW (*)    All other components within normal limits  BASIC METABOLIC PANEL - Abnormal; Notable for the following components:   Sodium 134 (*)     Potassium 2.6 (*)    Chloride 95 (*)    Glucose, Bld 114 (*)    Creatinine, Ser 1.76 (*)    GFR calc non Af Amer 48 (*)    GFR calc Af Amer 55 (*)    All other components within normal limits  CBG MONITORING, ED - Abnormal; Notable for the following components:   Glucose-Capillary 121 (*)    All other components within normal limits  SARS CORONAVIRUS  2 (TAT 6-24 HRS)  PROTIME-INR    EKG EKG Interpretation  Date/Time:  Sunday May 19 2019 16:41:30 EST Ventricular Rate:  80 PR Interval:    QRS Duration: 107 QT Interval:  413 QTC Calculation: 477 R Axis:   -10 Text Interpretation: Sinus rhythm Probable left atrial enlargement LVH with secondary repolarization abnormality Anterior ST elevation, probably due to LVH Borderline prolonged QT interval Since last tracing Nonspecific T wave abnormality NOW PRESENT Confirmed by Karle Starch  MD, Juanda Crumble [phone]) on 05/19/2019 5:00:36 PM   Radiology CT Head Wo Contrast  Result Date: 05/19/2019 CLINICAL DATA:  Diffuse right body numbness for 2-3 minutes at noon today. Uncontrolled hypertension despite medication. EXAM: CT HEAD WITHOUT CONTRAST TECHNIQUE: Contiguous axial images were obtained from the base of the skull through the vertex without intravenous contrast. COMPARISON:  12/11/2016 FINDINGS: Brain: Normal appearing cerebral hemispheres and posterior fossa structures. Normal size and position of the ventricles. No intracranial hemorrhage, mass lesion or CT evidence of acute infarction. Vascular: No hyperdense vessel or unexpected calcification. Skull: Normal. Negative for fracture or focal lesion. Sinuses/Orbits: Marked left maxillary sinus mucosal thickening. Unremarkable orbits. Other: None. IMPRESSION: 1. No intracranial abnormality. 2. Marked chronic left maxillary sinusitis. Electronically Signed   By: Claudie Revering M.D.   On: 05/19/2019 17:16    Procedures Procedures (including critical care time)  Medications Ordered in ED Medications   potassium chloride 10 mEq in 100 mL IVPB (10 mEq Intravenous New Bag/Given 05/19/19 2258)  potassium chloride SA (KLOR-CON) CR tablet 40 mEq (40 mEq Oral Given 05/19/19 1755)  potassium chloride 10 mEq in 100 mL IVPB (0 mEq Intravenous Stopped 05/19/19 2052)  labetalol (NORMODYNE) injection 20 mg (20 mg Intravenous Given 05/19/19 2243)    ED Course  I have reviewed the triage vital signs and the nursing notes.  Pertinent labs & imaging results that were available during my care of the patient were reviewed by me and considered in my medical decision making (see chart for details).  Clinical Course as of May 19 2346  Nancy Fetter May 19, 2019  1716 Patient with history of hypertension but no other significant medical problems here for evaluation of a brief episode of numbness on the right side of his body that resolved after short time.  He does not have any other reported stroke symptoms.  His blood pressure is elevated on arrival but his neuro exam is normal.  We will check his labs, CT head and reassess.   [CS]  [id number] Patient's K is low, Cr is mildly elevated but not significantly above baseline from 2 years ago. Patient is taking Amlodipine and Chlorthalidone which may be causing the hypokalemia. Head CT is neg. Will replete K orally and IV here and reassess.    [CS]  2049 Patient remains asymptomatic, ABCD2 score is 1. BP remains elevated.    [CS]  2224 No improvement in K after 34meq PO and 76meq IV. BP also remains markedly elevated. Will give a dose of Labetalol and discuss admission with the Hospitalists.    [CS]  2243 Spoke with Dr. Hal Hope, Hospitalist, who will accept the patient for admission.    [CS]    Clinical Course User Index [CS] Truddie Hidden, MD   Final Clinical Impression(s) / ED Diagnoses Final diagnoses:  TIA (transient ischemic attack)  Hypokalemia  Hypertension, unspecified type    Rx / DC Orders ED Discharge Orders    None       Truddie Hidden, MD  05/19/19 2348  

## 2019-05-19 NOTE — ED Notes (Signed)
Date and time results received: 05/19/19 1722   Test: potassium Critical Value:2.6  Name of Provider Notified: Karle Starch Orders Received? Or Actions Taken?: no orders given

## 2019-05-20 ENCOUNTER — Encounter (HOSPITAL_COMMUNITY): Payer: Self-pay | Admitting: Student

## 2019-05-20 ENCOUNTER — Observation Stay (HOSPITAL_COMMUNITY): Payer: Self-pay

## 2019-05-20 ENCOUNTER — Observation Stay (HOSPITAL_BASED_OUTPATIENT_CLINIC_OR_DEPARTMENT_OTHER): Payer: Self-pay

## 2019-05-20 DIAGNOSIS — E7849 Other hyperlipidemia: Secondary | ICD-10-CM

## 2019-05-20 DIAGNOSIS — E7800 Pure hypercholesterolemia, unspecified: Secondary | ICD-10-CM

## 2019-05-20 DIAGNOSIS — G459 Transient cerebral ischemic attack, unspecified: Secondary | ICD-10-CM

## 2019-05-20 DIAGNOSIS — F17200 Nicotine dependence, unspecified, uncomplicated: Secondary | ICD-10-CM

## 2019-05-20 DIAGNOSIS — N179 Acute kidney failure, unspecified: Secondary | ICD-10-CM

## 2019-05-20 DIAGNOSIS — I160 Hypertensive urgency: Secondary | ICD-10-CM

## 2019-05-20 DIAGNOSIS — F1290 Cannabis use, unspecified, uncomplicated: Secondary | ICD-10-CM

## 2019-05-20 DIAGNOSIS — E876 Hypokalemia: Secondary | ICD-10-CM

## 2019-05-20 DIAGNOSIS — R202 Paresthesia of skin: Secondary | ICD-10-CM

## 2019-05-20 DIAGNOSIS — Z9189 Other specified personal risk factors, not elsewhere classified: Secondary | ICD-10-CM

## 2019-05-20 HISTORY — DX: Hypokalemia: E87.6

## 2019-05-20 HISTORY — DX: Acute kidney failure, unspecified: N17.9

## 2019-05-20 LAB — BASIC METABOLIC PANEL
Anion gap: 11 (ref 5–15)
BUN: 12 mg/dL (ref 6–20)
CO2: 28 mmol/L (ref 22–32)
Calcium: 9.1 mg/dL (ref 8.9–10.3)
Chloride: 99 mmol/L (ref 98–111)
Creatinine, Ser: 1.72 mg/dL — ABNORMAL HIGH (ref 0.61–1.24)
GFR calc Af Amer: 57 mL/min — ABNORMAL LOW (ref >60)
GFR calc non Af Amer: 49 mL/min — ABNORMAL LOW (ref >60)
Glucose, Bld: 124 mg/dL — ABNORMAL HIGH (ref 70–99)
Potassium: 3.2 mmol/L — ABNORMAL LOW (ref 3.5–5.1)
Sodium: 138 mmol/L (ref 135–145)

## 2019-05-20 LAB — CBC
HCT: 40.8 % (ref 39.0–52.0)
Hemoglobin: 13.6 g/dL (ref 13.0–17.0)
MCH: 27.1 pg (ref 26.0–34.0)
MCHC: 33.3 g/dL (ref 30.0–36.0)
MCV: 81.3 fL (ref 80.0–100.0)
Platelets: 557 10*3/uL — ABNORMAL HIGH (ref 150–400)
RBC: 5.02 MIL/uL (ref 4.22–5.81)
RDW: 15.8 % — ABNORMAL HIGH (ref 11.5–15.5)
WBC: 8.1 10*3/uL (ref 4.0–10.5)
nRBC: 0 % (ref 0.0–0.2)

## 2019-05-20 LAB — LIPID PANEL
Cholesterol: 298 mg/dL — ABNORMAL HIGH (ref 0–200)
HDL: 31 mg/dL — ABNORMAL LOW (ref >40)
LDL Cholesterol: 232 mg/dL — ABNORMAL HIGH (ref 0–99)
Total CHOL/HDL Ratio: 9.6 RATIO
Triglycerides: 173 mg/dL — ABNORMAL HIGH (ref <150)
VLDL: 35 mg/dL (ref 0–40)

## 2019-05-20 LAB — TSH: TSH: 1.894 u[IU]/mL (ref 0.350–4.500)

## 2019-05-20 LAB — MAGNESIUM: Magnesium: 2.2 mg/dL (ref 1.7–2.4)

## 2019-05-20 LAB — ECHOCARDIOGRAM COMPLETE
Height: 72.992 in
Weight: 3502.67 oz

## 2019-05-20 LAB — HEMOGLOBIN A1C
Hgb A1c MFr Bld: 6.2 % — ABNORMAL HIGH (ref 4.8–5.6)
Mean Plasma Glucose: 131.24 mg/dL

## 2019-05-20 LAB — SARS CORONAVIRUS 2 (TAT 6-24 HRS): SARS Coronavirus 2: NEGATIVE

## 2019-05-20 LAB — BRAIN NATRIURETIC PEPTIDE: B Natriuretic Peptide: 99.6 pg/mL (ref 0.0–100.0)

## 2019-05-20 LAB — MRSA PCR SCREENING: MRSA by PCR: NEGATIVE

## 2019-05-20 MED ORDER — LABETALOL HCL 5 MG/ML IV SOLN
10.0000 mg | Freq: Once | INTRAVENOUS | Status: AC
  Administered 2019-05-20: 10 mg via INTRAVENOUS
  Filled 2019-05-20: qty 4

## 2019-05-20 MED ORDER — MAGNESIUM SULFATE IN D5W 1-5 GM/100ML-% IV SOLN
1.0000 g | Freq: Once | INTRAVENOUS | Status: AC
  Administered 2019-05-20: 1 g via INTRAVENOUS
  Filled 2019-05-20: qty 100

## 2019-05-20 MED ORDER — ALUM & MAG HYDROXIDE-SIMETH 200-200-20 MG/5ML PO SUSP
30.0000 mL | ORAL | Status: DC | PRN
  Administered 2019-05-20 – 2019-05-25 (×2): 30 mL via ORAL
  Filled 2019-05-20 (×2): qty 30

## 2019-05-20 MED ORDER — NICOTINE 21 MG/24HR TD PT24
21.0000 mg | MEDICATED_PATCH | Freq: Every day | TRANSDERMAL | Status: DC
  Administered 2019-05-20: 21 mg via TRANSDERMAL
  Filled 2019-05-20 (×4): qty 1

## 2019-05-20 MED ORDER — LABETALOL HCL 5 MG/ML IV SOLN
10.0000 mg | INTRAVENOUS | Status: DC | PRN
  Administered 2019-05-20 – 2019-05-23 (×8): 10 mg via INTRAVENOUS
  Filled 2019-05-20 (×8): qty 4

## 2019-05-20 MED ORDER — ACETAMINOPHEN 325 MG PO TABS
650.0000 mg | ORAL_TABLET | Freq: Four times a day (QID) | ORAL | Status: DC | PRN
  Administered 2019-05-20 – 2019-05-23 (×2): 650 mg via ORAL
  Filled 2019-05-20 (×3): qty 2

## 2019-05-20 MED ORDER — ATORVASTATIN CALCIUM 10 MG PO TABS
20.0000 mg | ORAL_TABLET | Freq: Every day | ORAL | Status: DC
  Administered 2019-05-20 – 2019-05-24 (×5): 20 mg via ORAL
  Filled 2019-05-20 (×5): qty 2

## 2019-05-20 MED ORDER — CHLORTHALIDONE 25 MG PO TABS
25.0000 mg | ORAL_TABLET | Freq: Every day | ORAL | Status: DC
  Administered 2019-05-20 – 2019-05-22 (×3): 25 mg via ORAL
  Filled 2019-05-20 (×4): qty 1

## 2019-05-20 MED ORDER — HEPARIN SODIUM (PORCINE) 5000 UNIT/ML IJ SOLN
5000.0000 [IU] | Freq: Three times a day (TID) | INTRAMUSCULAR | Status: DC
  Administered 2019-05-20 – 2019-05-25 (×14): 5000 [IU] via SUBCUTANEOUS
  Filled 2019-05-20 (×14): qty 1

## 2019-05-20 MED ORDER — SODIUM CHLORIDE 0.9 % IV SOLN
250.0000 mL | INTRAVENOUS | Status: DC | PRN
  Administered 2019-05-20: 250 mL via INTRAVENOUS

## 2019-05-20 MED ORDER — AMLODIPINE BESYLATE 10 MG PO TABS
10.0000 mg | ORAL_TABLET | Freq: Every day | ORAL | Status: DC
  Administered 2019-05-20 – 2019-05-25 (×6): 10 mg via ORAL
  Filled 2019-05-20 (×6): qty 1

## 2019-05-20 MED ORDER — POTASSIUM CHLORIDE 10 MEQ/100ML IV SOLN
10.0000 meq | INTRAVENOUS | Status: DC
  Administered 2019-05-20: 10 meq via INTRAVENOUS
  Filled 2019-05-20: qty 100

## 2019-05-20 MED ORDER — POTASSIUM CHLORIDE 10 MEQ/100ML IV SOLN
10.0000 meq | INTRAVENOUS | Status: AC
  Administered 2019-05-20: 10 meq via INTRAVENOUS
  Filled 2019-05-20 (×2): qty 100

## 2019-05-20 MED ORDER — HYDRALAZINE HCL 50 MG PO TABS
50.0000 mg | ORAL_TABLET | Freq: Three times a day (TID) | ORAL | Status: DC
  Administered 2019-05-20 – 2019-05-21 (×4): 50 mg via ORAL
  Filled 2019-05-20 (×4): qty 1

## 2019-05-20 MED ORDER — ASPIRIN EC 81 MG PO TBEC
81.0000 mg | DELAYED_RELEASE_TABLET | Freq: Every day | ORAL | Status: DC
  Administered 2019-05-20 – 2019-05-25 (×6): 81 mg via ORAL
  Filled 2019-05-20 (×6): qty 1

## 2019-05-20 MED ORDER — RAMELTEON 8 MG PO TABS
8.0000 mg | ORAL_TABLET | Freq: Every day | ORAL | Status: DC
  Administered 2019-05-20 – 2019-05-24 (×5): 8 mg via ORAL
  Filled 2019-05-20 (×6): qty 1

## 2019-05-20 MED ORDER — SODIUM CHLORIDE 0.9% FLUSH
3.0000 mL | Freq: Two times a day (BID) | INTRAVENOUS | Status: DC
  Administered 2019-05-20 – 2019-05-25 (×10): 3 mL via INTRAVENOUS

## 2019-05-20 MED ORDER — POTASSIUM CHLORIDE CRYS ER 20 MEQ PO TBCR
40.0000 meq | EXTENDED_RELEASE_TABLET | Freq: Two times a day (BID) | ORAL | Status: DC
  Administered 2019-05-20 (×2): 40 meq via ORAL
  Filled 2019-05-20 (×2): qty 2

## 2019-05-20 MED ORDER — ALBUTEROL SULFATE (2.5 MG/3ML) 0.083% IN NEBU
3.0000 mL | INHALATION_SOLUTION | RESPIRATORY_TRACT | Status: DC | PRN
  Filled 2019-05-20: qty 3

## 2019-05-20 MED ORDER — POTASSIUM CHLORIDE CRYS ER 20 MEQ PO TBCR
40.0000 meq | EXTENDED_RELEASE_TABLET | ORAL | Status: AC
  Administered 2019-05-20 (×2): 40 meq via ORAL
  Filled 2019-05-20 (×2): qty 2

## 2019-05-20 MED ORDER — SODIUM CHLORIDE 0.9% FLUSH: 3.0000 mL | INTRAVENOUS | Status: DC | PRN

## 2019-05-20 MED ORDER — LOSARTAN POTASSIUM 50 MG PO TABS: 50.0000 mg | ORAL_TABLET | Freq: Every day | ORAL | Status: DC

## 2019-05-20 MED ORDER — TRAMADOL HCL 50 MG PO TABS
50.0000 mg | ORAL_TABLET | Freq: Four times a day (QID) | ORAL | Status: DC | PRN
  Administered 2019-05-21: 50 mg via ORAL
  Filled 2019-05-20: qty 1

## 2019-05-20 MED ORDER — CARVEDILOL 6.25 MG PO TABS
6.2500 mg | ORAL_TABLET | Freq: Two times a day (BID) | ORAL | Status: DC
  Administered 2019-05-20 – 2019-05-21 (×3): 6.25 mg via ORAL
  Filled 2019-05-20 (×3): qty 1

## 2019-05-20 NOTE — Progress Notes (Signed)
  Echocardiogram 2D Echocardiogram has been performed.  Jennette Dubin 05/20/2019, 3:23 PM

## 2019-05-20 NOTE — H&P (Signed)
History and Physical    Russell Hernandez [id number] DOB: [birth year] DOA: 05/19/2019  PCP: Patient, No Pcp Per  Patient coming from: Right-sided numbness  Chief Complaint: Home  HPI: Russell Hernandez is a 39 y.o. male with medical history significant of hypertension comes in with right-sided numbness that occurred today for about 1520 minutes and then went away.  He had no other neurological symptoms with it.  No fevers no nausea vomiting diarrhea.  Patient reports that he has been working with his primary care physician to get his blood pressure under control for 5 months now.  He does not monitor his blood pressure at home.  Patient reports that he is probably missed 2 days of his blood pressure medications this week because he ran out and had to get refills it took him a couple days to get it.  Otherwise he says he is compliant.  He denies any kidney disease chronically that he knows of.  Patient be referred for admission for possible TIA and hypertensive emergency.  Neurology has been called also for consultation.  Review of Systems: As per HPI otherwise 10 point review of systems negative.   Past Medical History:  Diagnosis Date  . Hypertension     History reviewed. No pertinent surgical history.   reports that he has been smoking cigarettes. He has been smoking about 0.50 packs per day. He has never used smokeless tobacco. He reports current alcohol use. He reports current drug use. Drug: Marijuana.  Allergies  Allergen Reactions  . Onion Other (See Comments)    Sinus congestion    Family History  Family history unknown: Yes  No premature coronary artery disease or stroke  Prior to Admission medications   Medication Sig Start Date End Date Taking? Authorizing Provider  albuterol (VENTOLIN HFA) 108 (90 Base) MCG/ACT inhaler Inhale 2 puffs into the lungs every 4 (four) hours as needed for wheezing or shortness of breath. 02/05/19   Robyn Haber, MD  amLODipine  (NORVASC) 5 MG tablet Take 2 tablets (10 mg total) by mouth daily. 02/05/19   Robyn Haber, MD  chlorthalidone (HYGROTON) 25 MG tablet Take 1 tablet (25 mg total) by mouth daily. 02/05/19   Robyn Haber, MD  ibuprofen (ADVIL,MOTRIN) 200 MG tablet Take 200 mg by mouth every 6 (six) hours as needed for headache (pain).    [provider]  hydrochlorothiazide (HYDRODIURIL) 25 MG tablet Take 1 tablet (25 mg total) by mouth daily. 12/10/16 02/05/19  [name], MD  lisinopril (PRINIVIL,ZESTRIL) 10 MG tablet Take 1 tablet (10 mg total) by mouth daily. 12/10/16 02/05/19  [name], MD  omeprazole (PRILOSEC) 20 MG capsule Take 1 capsule (20 mg total) by mouth daily. 12/11/16 02/05/19  Doristine Devoid, PA-C  ranitidine (ZANTAC) 150 MG tablet Take 1 tablet (150 mg total) by mouth 2 (two) times daily. 12/13/16 02/05/19  [name], [name], MD    Physical Exam: Vitals:   05/19/19 2300 05/19/19 2330 05/20/19 0000 05/20/19 0015  BP: (!) 187/110 (!) 192/117 (!) 181/116 (!) 176/117  Pulse: 72 75 72 71  Resp:  17 20 18   Temp:      TempSrc:      SpO2: 95% 96% 97% 98%  Weight:      Height:          Constitutional: NAD, calm, comfortable Vitals:   05/19/19 2300 05/19/19 2330 05/20/19 0000 05/20/19 0015  BP: (!) 187/110 (!) 192/117 (!) 181/116 (!) 176/117  Pulse: 72 75 72 71  Resp:  17 20 18   Temp:      TempSrc:      SpO2: 95% 96% 97% 98%  Weight:      Height:       Eyes: PERRL, lids and conjunctivae normal ENMT: Mucous membranes are moist. Posterior pharynx clear of any exudate or lesions.Normal dentition.  Neck: normal, supple, no masses, no thyromegaly Respiratory: clear to auscultation bilaterally, no wheezing, no crackles. Normal respiratory effort. No accessory muscle use.  Cardiovascular: Regular rate and rhythm, no murmurs / rubs / gallops. No extremity edema. 2+ pedal pulses. No carotid bruits.  Abdomen: no tenderness, no masses palpated. No hepatosplenomegaly.  Bowel sounds positive.  Musculoskeletal: no clubbing / cyanosis. No joint deformity upper and lower extremities. Good ROM, no contractures. Normal muscle tone.  Skin: no rashes, lesions, ulcers. No induration Neurologic: CN 2-12 grossly intact. Sensation intact, DTR normal. Strength 5/5 in all 4.  Psychiatric: Normal judgment and insight. Alert and oriented x 3. Normal mood.    Labs on Admission: I have personally reviewed following labs and imaging studies  CBC: Recent Labs  Lab 05/19/19 1652  WBC 9.8  NEUTROABS 6.0  HGB 14.0  HCT 41.5  MCV 83.2  PLT [id number]*   Basic Metabolic Panel: Recent Labs  Lab 05/19/19 1652 05/19/19 2137  NA 133* 134*  K 2.6* 2.6*  CL 92* 95*  CO2 29 29  GLUCOSE 108* 114*  BUN 19 17  CREATININE 1.72* 1.76*  CALCIUM 9.5 9.1   GFR: Estimated Creatinine Clearance: 69.9 mL/min (A) (by C-G formula based on SCr of 1.76 mg/dL (H)). Liver Function Tests: No results for input(s): AST, ALT, ALKPHOS, BILITOT, PROT, ALBUMIN in the last 168 hours. No results for input(s): LIPASE, AMYLASE in the last 168 hours. No results for input(s): AMMONIA in the last 168 hours. Coagulation Profile: Recent Labs  Lab 05/19/19 1652  INR 0.9   Cardiac Enzymes: No results for input(s): CKTOTAL, CKMB, CKMBINDEX, TROPONINI in the last 168 hours. BNP (last 3 results) No results for input(s): PROBNP in the last 8760 hours. HbA1C: No results for input(s): HGBA1C in the last 72 hours. CBG: Recent Labs  Lab 05/19/19 1610  GLUCAP 121*   Lipid Profile: No results for input(s): CHOL, HDL, LDLCALC, TRIG, CHOLHDL, LDLDIRECT in the last 72 hours. Thyroid Function Tests: No results for input(s): TSH, T4TOTAL, FREET4, T3FREE, THYROIDAB in the last 72 hours. Anemia Panel: No results for input(s): VITAMINB12, FOLATE, FERRITIN, TIBC, IRON, RETICCTPCT in the last 72 hours. Urine analysis:    Component Value Date/Time   COLORURINE YELLOW 05/19/2019 1642   APPEARANCEUR CLEAR  05/19/2019 1642   LABSPEC 1.020 05/19/2019 1642   PHURINE 6.0 05/19/2019 1642   GLUCOSEU NEGATIVE 05/19/2019 1642   HGBUR SMALL (A) 05/19/2019 1642   BILIRUBINUR NEGATIVE 05/19/2019 1642   KETONESUR NEGATIVE 05/19/2019 1642   PROTEINUR 100 (A) 05/19/2019 1642   NITRITE NEGATIVE 05/19/2019 1642   LEUKOCYTESUR NEGATIVE 05/19/2019 1642   Sepsis Labs: !!!!!!!!!!!!!!!!!!!!!!!!!!!!!!!!!!!!!!!!!!!! @LABRCNTIP (procalcitonin:4,lacticidven:4) )No results found for this or any previous visit (from the past 240 hour(s)).   Radiological Exams on Admission: CT Head Wo Contrast  Result Date: 05/19/2019 CLINICAL DATA:  Diffuse right body numbness for 2-3 minutes at noon today. Uncontrolled hypertension despite medication. EXAM: CT HEAD WITHOUT CONTRAST TECHNIQUE: Contiguous axial images were obtained from the base of the skull through the vertex without intravenous contrast. COMPARISON:  12/11/2016 FINDINGS: Brain: Normal appearing cerebral hemispheres and posterior fossa structures. Normal size and position of the  ventricles. No intracranial hemorrhage, mass lesion or CT evidence of acute infarction. Vascular: No hyperdense vessel or unexpected calcification. Skull: Normal. Negative for fracture or focal lesion. Sinuses/Orbits: Marked left maxillary sinus mucosal thickening. Unremarkable orbits. Other: None. IMPRESSION: 1. No intracranial abnormality. 2. Marked chronic left maxillary sinusitis. Electronically Signed   By: Claudie Revering M.D.   On: 05/19/2019 17:16    EKG: Independently reviewed.  Normal sinus rhythm with likely early repole Old chart reviewed   Assessment/Plan 39 year old male with uncontrolled hypertension comes in with TIA-like symptoms  Principal Problem:   Hypertensive urgency-resume home meds allow permissive hypertension until MRI stroke ruled out.  Active Problems:   TIA (transient ischemic attack)-obtain MRI if normal no further work-up.  Placed on aspirin.  Frequent  neurological checks.  Check fasting lipid panel in the morning.    Hypokalemia-repleted in the ED and not better.  No magnesium level checked yet.  Check mag now.  Give 1 g mag sulfate.  Give KCl 40 mEq IV and 40 p.o. right now.    AKI (acute kidney injury) (HCC)-versus chronic kidney disease unknown whether this is acute or chronic.  Given IV fluids and ID and will repeat BMP in the morning.  Likely chronic.  UA shows protein.   DVT prophylaxis: SCDs Code Status: Full Family Communication: None Disposition Plan: 1 to 2 days Consults called: Neurology Admission status: Observation   Lonisha Bobby A MD [facility]  If 7PM-7AM, please contact night-coverage [email or web address] Password [email or web address]  05/20/2019, 2:16 AM

## 2019-05-20 NOTE — Progress Notes (Signed)
RN called to patient room due to arm hurting with potassium running. RN has added saline and reduced rate. Pt still complains about arm hurting. RN paged MD for further instructions and orders. Patient states "does not want to be here and hopes he is sent home today."

## 2019-05-20 NOTE — Progress Notes (Signed)
PROGRESS NOTE  Russell Hernandez [id number] DOB: 24-Feb-1981   PCP: Patient, No Pcp Per  Patient is from: Home.  DOA: 05/19/2019 LOS: 0  Brief Narrative / Interim history: 39 year old male with history of HTN, tobacco use and marijuana use presenting with right-sided numbness that lasted about 2 minutes.  No other associated symptoms.  Has been compliant with his amlodipine and chlorthalidone.  In ED, SBP in the 200s.  DBP in 130s. Na 133.  K2.6. Cr 1.72.  BUN 19.  CBC without significant finding.  UA with a small Hgb, 100 protein and few bacteria.  UDS positive for THC.  COVID-19 negative.  CT head without acute finding.  EKG with LVH and mild SE in anterior leads but doesn't meet criteria for STEMI.  He was admitted for TIA work-up and hypertensive urgency.  MRI brain without acute finding.  Blood pressure remains elevated.  Subjective: Patient had another episode of right-sided numbness earlier this morning that lasted about 1 minute.  Denies headache, vision change, speech change, focal weakness, chest pain, dyspnea, GI or UTI symptoms.  Objective: Vitals:   05/20/19 0445 05/20/19 0747 05/20/19 0806 05/20/19 0938  BP: (!) 172/104 (!) 189/122 (!) 184/122 (!) 188/132  Pulse: 76     Resp: 18 17    Temp: 98 F (36.7 C) 98.6 F (37 C)    TempSrc: Oral Oral    SpO2:      Weight:      Height:        Intake/Output Summary (Last 24 hours) at 05/20/2019 1249 Last data filed at 05/20/2019 0926 Gross per 24 hour  Intake 1050 ml  Output 1200 ml  Net -150 ml   Filed Weights   05/19/19 1609 05/20/19 0100  Weight: 99.3 kg 99.3 kg    Examination:  GENERAL: No acute distress.  Appears well.  HEENT: MMM.  Vision and hearing grossly intact.  NECK: Supple.  No apparent JVD.  RESP:  No IWOB. Good air movement bilaterally. CVS:  RRR. Heart sounds normal.  Distal pulses symmetric and normal ABD/GI/GU: Bowel sounds present. Soft. Non tender.  MSK/EXT:  Moves extremities. No apparent  deformity. No edema.  SKIN: no apparent skin lesion or wound NEURO: Awake, alert and oriented appropriately.  CN II-XII intact.  Motor 5/5 in all extremities.  Light sensation and patellar reflex intact.  Finger-to-nose and rapid alternating movements intact. PSYCH: Calm. Normal affect.  Procedures:  None  Assessment & Plan: Transient right paresthesia/TIA-likely due to uncontrolled hypertension.  Neuro exam reassuring.  MRI brain without acute finding. -Optimize blood pressure control -Work-up for hypertension as below. -LDL 232-start statin.  A1c 6.2%.  Hypertensive urgency: SBP 208 on admit> 188 this morning.  DBP remains elevated in 130s.  UA with small Hgb and 100 protein.  TSH within normal.  No cardiopulmonary symptoms. -Obtain echocardiogram, renal duplex, renin and aldosterone levels -May benefit from sleep study-reports snoring and daytime fatigue -Add hydralazine 50 mg 3 times daily and labetalol 10 mg as needed.   -Continue amlodipine and chlorthalidone  Tobacco use disorder -Encouraged cessation. -Nicotine patch  Marijuana use- -counseled.  Elevated creatinine-not clear if this is AKI or progression of CKD.  BUN within normal.  Baseline Cr ~1.4> 1.72 (admit)> 1.72 -Renal duplex as above -Continue monitoring  Prediabetes -Encourage lifestyle change  Hyperlipidemia: LDL 232.  HDL 31. -Start atorvastatin  Hypokalemia:  -Replenished and recheck               DVT prophylaxis:  Subcu heparin Code Status: Full code Family Communication: Patient and/or RN. Available if any question.   Discharge barrier: Markedly elevated blood pressure.  Needs further evaluation, adjustment of his medications under close monitoring.  Very high risk for adverse outcome.  Patient is from: Home Final disposition: Likely home  Consultants: None   Microbiology summarized: COVID-19 negative MRSA PCR negative  Sch Meds:  Scheduled Meds: . amLODipine  10 mg Oral Daily  .  aspirin EC  81 mg Oral Daily  . atorvastatin  20 mg Oral q1800  . chlorthalidone  25 mg Oral Daily  . hydrALAZINE  50 mg Oral Q8H  . losartan  50 mg Oral Daily  . sodium chloride flush  3 mL Intravenous Q12H   Continuous Infusions: . sodium chloride 250 mL (05/20/19 0702)   PRN Meds:.sodium chloride, albuterol, sodium chloride flush  Antimicrobials: Anti-infectives (From admission, onward)   None       I have personally reviewed the following labs and images: CBC: Recent Labs  Lab 05/19/19 1652 05/20/19 0627  WBC 9.8 8.1  NEUTROABS 6.0  --   HGB 14.0 13.6  HCT 41.5 40.8  MCV 83.2 81.3  PLT 564* 557*   BMP &GFR Recent Labs  Lab 05/19/19 1652 05/19/19 2137 05/20/19 0157 05/20/19 0627  NA 133* 134*  --  138  K 2.6* 2.6*  --  3.2*  CL 92* 95*  --  99  CO2 29 29  --  28  GLUCOSE 108* 114*  --  124*  BUN 19 17  --  12  CREATININE 1.72* 1.76*  --  1.72*  CALCIUM 9.5 9.1  --  9.1  MG  --   --  2.2  --    Estimated Creatinine Clearance: 71.5 mL/min (A) (by C-G formula based on SCr of 1.72 mg/dL (H)). Liver & Pancreas: No results for input(s): AST, ALT, ALKPHOS, BILITOT, PROT, ALBUMIN in the last 168 hours. No results for input(s): LIPASE, AMYLASE in the last 168 hours. No results for input(s): AMMONIA in the last 168 hours. Diabetic: Recent Labs    05/20/19 0627  HGBA1C 6.2*   Recent Labs  Lab 05/19/19 1610  GLUCAP 121*   Cardiac Enzymes: No results for input(s): CKTOTAL, CKMB, CKMBINDEX, TROPONINI in the last 168 hours. No results for input(s): PROBNP in the last 8760 hours. Coagulation Profile: Recent Labs  Lab 05/19/19 1652  INR 0.9   Thyroid Function Tests: No results for input(s): TSH, T4TOTAL, FREET4, T3FREE, THYROIDAB in the last 72 hours. Lipid Profile: Recent Labs    05/20/19 0627  CHOL 298*  HDL 31*  LDLCALC 232*  TRIG 173*  CHOLHDL 9.6   Anemia Panel: No results for input(s): VITAMINB12, FOLATE, FERRITIN, TIBC, IRON, RETICCTPCT  in the last 72 hours. Urine analysis:    Component Value Date/Time   COLORURINE YELLOW 05/19/2019 1642   APPEARANCEUR CLEAR 05/19/2019 1642   LABSPEC 1.020 05/19/2019 1642   PHURINE 6.0 05/19/2019 1642   GLUCOSEU NEGATIVE 05/19/2019 1642   HGBUR SMALL (A) 05/19/2019 1642   BILIRUBINUR NEGATIVE 05/19/2019 1642   KETONESUR NEGATIVE 05/19/2019 1642   PROTEINUR 100 (A) 05/19/2019 1642   NITRITE NEGATIVE 05/19/2019 1642   LEUKOCYTESUR NEGATIVE 05/19/2019 1642   Sepsis Labs: Invalid input(s): PROCALCITONIN, [place]  Microbiology: Recent Results (from the past 240 hour(s))  SARS CORONAVIRUS 2 (TAT 6-24 HRS) Nasopharyngeal Nasopharyngeal Swab     Status: None   Collection Time: 05/19/19 10:42 PM   Specimen: Nasopharyngeal Swab  Result Value Ref Range Status   SARS Coronavirus 2 NEGATIVE NEGATIVE Final    Comment: (NOTE) SARS-CoV-2 target nucleic acids are NOT DETECTED. The SARS-CoV-2 RNA is generally detectable in upper and lower respiratory specimens during the acute phase of infection. Negative results do not preclude SARS-CoV-2 infection, do not rule out co-infections with other pathogens, and should not be used as the sole basis for treatment or other patient management decisions. Negative results must be combined with clinical observations, patient history, and epidemiological information. The expected result is Negative. Fact Sheet for Patients: SugarRoll.be Fact Sheet for Healthcare Providers: https://www.woods-mathews.com/ This test is not yet approved or cleared by the [place] FDA and  has been authorized for detection and/or diagnosis of SARS-CoV-2 by FDA under an Emergency Use Authorization (EUA). This EUA will remain  in effect (meaning this test can be used) for the duration of the COVID-19 declaration under Section 56 4(b)(1) of the Act, 21 U.S.C. section 360bbb-3(b)(1), unless the authorization is terminated  or revoked sooner. Performed at [facility], [place] [address], [place], [place] [address]   MRSA PCR Screening     Status: None   Collection Time: 05/20/19  1:08 AM   Specimen: Nasal Mucosa; Nasopharyngeal  Result Value Ref Range Status   MRSA by PCR NEGATIVE NEGATIVE Final    Comment:        The GeneXpert MRSA Assay (FDA approved for NASAL specimens only), is one component of a comprehensive MRSA colonization surveillance program. It is not intended to diagnose MRSA infection nor to guide or monitor treatment for MRSA infections. Performed at [facility], [place] [address], [place] [address], [place] [address]     Radiology Studies: CT Head Wo Contrast  Result Date: 05/19/2019 CLINICAL DATA:  Diffuse right body numbness for 2-3 minutes at noon today. Uncontrolled hypertension despite medication. EXAM: CT HEAD WITHOUT CONTRAST TECHNIQUE: Contiguous axial images were obtained from the base of the skull through the vertex without intravenous contrast. COMPARISON:  12/11/2016 FINDINGS: Brain: Normal appearing cerebral hemispheres and posterior fossa structures. Normal size and position of the ventricles. No intracranial hemorrhage, mass lesion or CT evidence of acute infarction. Vascular: No hyperdense vessel or unexpected calcification. Skull: Normal. Negative for fracture or focal lesion. Sinuses/Orbits: Marked left maxillary sinus mucosal thickening. Unremarkable orbits. Other: None. IMPRESSION: 1. No intracranial abnormality. 2. Marked chronic left maxillary sinusitis. Electronically Signed   By: Claudie Revering M.D.   On: 05/19/2019 17:16   MR BRAIN WO CONTRAST  Result Date: 05/20/2019 CLINICAL DATA:  TIA.  Right-sided numbness today. EXAM: MRI HEAD WITHOUT CONTRAST TECHNIQUE: Multiplanar, multiecho pulse sequences of the brain and surrounding structures were obtained without intravenous contrast. COMPARISON:  Head CT from yesterday FINDINGS: Brain: No acute infarction, hemorrhage,  hydrocephalus, extra-axial collection or mass lesion. There are a few tiny FLAIR hyperintensities in the cerebral white matter with chronic lacunes at the right sub cortical parasagittal frontal lobe and at the posterior right putamen. Brain volume is normal. No chronic microhemorrhages Vascular: Intracranial vessels are tortuous, likely related to history of chronic hypertension Skull and upper cervical spine: Normal Sinuses/Orbits: Negative orbits. Chronic maxillary and left ethmoid sinus disease that is most notable in the left maxillary sinus where there is atelectasis and marked mucosal thickening around central inspissated material. A polyp is seen in the left nasal cavity which measures 16 mm. Other: Motion degraded study. IMPRESSION: 1. No acute finding. 2. Mild white matter disease with lacunes and intracranial vascular tortuosity  correlating with history of chronic hypertension. 3. Chronic sinusitis, most notable at the left maxillary sinus where there is an associated middle meatus and nasal cavity polyp. Recommend elective ENT referral. Electronically Signed   By: Monte Fantasia M.D.   On: 05/20/2019 04:47      Dugan Vanhoesen T. [place]  If 7PM-7AM, please contact night-coverage [email or web address] Password [facility] 05/20/2019, 12:49 PM

## 2019-05-20 NOTE — Progress Notes (Signed)
Paged MD to advise needs to be NPO at midnight.  Pt request something for sleep. Awaiting orders.

## 2019-05-20 NOTE — Consult Note (Addendum)
Cardiology Consultation:   Patient ID: Russell Hernandez MRN: [id number]; DOB: [birth year]  Admit date: 05/19/2019 Date of Consult: 05/20/2019  Primary Care Provider: Patient, No Pcp Per Primary Cardiologist: New to [facility] - Dr. Claiborne Billings Primary Electrophysiologist:  None    Patient Profile:   Russell Hernandez is a 39 y.o. male with a hx of HTN, tobacco abuse and daily marijuana use who is being seen today for the evaluation of cardiomyopathy at the request of Dr. Cyndia Skeeters.  History of Present Illness:   Russell Hernandez is a 39 year old male with past medical history of hypertension, tobacco abuse, daily marijuana use, newly diagnosed hyperlipidemia and prediabetes.  Patient does not have a primary care provider.  He says he takes his blood pressure medication when he gets them.  He is on 10 mg amlodipine and 25 mg chlorthalidone.  When he ran out of blood pressure medication, he would miss weeks at a time.  He denies ever taking cocaine or amphetamine before.  He has significant family history of hypertension on his mother side.  He does not know his father's family history.  Recently, he ran out of his blood pressure medication for about a week.  Since then, he has gotten a new prescription.  Yesterday afternoon, he had a episode of right-sided numbness that lasted up to 2 minutes.  He sought medical attention at [facility] and was admitted for stroke work-up.  CT of head and MRI were both negative for acute CVA.  On arrival, his potassium was extremely low at 2.6.  Creatinine was 1.7.  Previously in 2018, his baseline creatinine was about 1.5.  Urine drug test was positive for marijuana.  Urinalysis was positive for 100 protein and a few bacteria.  Normal sinus rhythm without significant ST-T wave changes.  Echocardiogram obtained on 05/20/2019 showed EF 45-50%, grade 1 DD, moderate LVH, no significant valve issue.  Hemoglobin A1c was 6.2.  Cardiology service was consulted for newly diagnosed  cardiomyopathy.  Heart Pathway Score:     Past Medical History:  Diagnosis Date  . Hypertension     Past Surgical History:  Procedure Laterality Date  . No past surgery       Home Medications:  Prior to Admission medications   Medication Sig Start Date End Date Taking? Authorizing Provider  amLODipine (NORVASC) 5 MG tablet Take 2 tablets (10 mg total) by mouth daily. 02/05/19  Yes Robyn Haber, MD  chlorthalidone (HYGROTON) 25 MG tablet Take 1 tablet (25 mg total) by mouth daily. 02/05/19  Yes Robyn Haber, MD  albuterol (VENTOLIN HFA) 108 (90 Base) MCG/ACT inhaler Inhale 2 puffs into the lungs every 4 (four) hours as needed for wheezing or shortness of breath. Patient not taking: Reported on 05/20/2019 02/05/19   Robyn Haber, MD  ibuprofen (ADVIL,MOTRIN) 200 MG tablet Take 200 mg by mouth every 6 (six) hours as needed for headache (pain).    [provider]  hydrochlorothiazide (HYDRODIURIL) 25 MG tablet Take 1 tablet (25 mg total) by mouth daily. 12/10/16 02/05/19  [name], MD  lisinopril (PRINIVIL,ZESTRIL) 10 MG tablet Take 1 tablet (10 mg total) by mouth daily. 12/10/16 02/05/19  [name], MD  omeprazole (PRILOSEC) 20 MG capsule Take 1 capsule (20 mg total) by mouth daily. 12/11/16 02/05/19  Doristine Devoid, PA-C  ranitidine (ZANTAC) 150 MG tablet Take 1 tablet (150 mg total) by mouth 2 (two) times daily. 12/13/16 02/05/19  [name], [name], MD    Inpatient Medications: Scheduled Meds: .  amLODipine  10 mg Oral Daily  . aspirin EC  81 mg Oral Daily  . atorvastatin  20 mg Oral q1800  . chlorthalidone  25 mg Oral Daily  . heparin  5,000 Units Subcutaneous Q8H  . hydrALAZINE  50 mg Oral Q8H  . nicotine  21 mg Transdermal Daily  . ramelteon  8 mg Oral QHS  . sodium chloride flush  3 mL Intravenous Q12H   Continuous Infusions: . sodium chloride 250 mL (05/20/19 0702)   PRN Meds: sodium chloride, albuterol, labetalol, sodium chloride  flush  Allergies:    Allergies  Allergen Reactions  . Onion Other (See Comments)    Sinus congestion    Social History:   Social History   Socioeconomic History  . Marital status: Legally Separated    Spouse name: Not on file  . Number of children: Not on file  . Years of education: Not on file  . Highest education level: Not on file  Occupational History  . Not on file  Tobacco Use  . Smoking status: Current Every Day Smoker    Packs/day: 0.50    Types: Cigarettes  . Smokeless tobacco: Never Used  Substance and Sexual Activity  . Alcohol use: Yes    Comment: occ  . Drug use: Yes    Types: Marijuana  . Sexual activity: Not on file  Other Topics Concern  . Not on file  Social History Narrative  . Not on file   Social Determinants of Health   Financial Resource Strain:   . Difficulty of Paying Living Expenses: Not on file  Food Insecurity:   . Worried About Charity fundraiser in the Last Year: Not on file  . Ran Out of Food in the Last Year: Not on file  Transportation Needs:   . Lack of Transportation (Medical): Not on file  . Lack of Transportation (Non-Medical): Not on file  Physical Activity:   . Days of Exercise per Week: Not on file  . Minutes of Exercise per Session: Not on file  Stress:   . Feeling of Stress : Not on file  Social Connections:   . Frequency of Communication with Friends and Family: Not on file  . Frequency of Social Gatherings with Friends and Family: Not on file  . Attends Religious Services: Not on file  . Active Member of Clubs or Organizations: Not on file  . Attends Archivist Meetings: Not on file  . Marital Status: Not on file  Intimate Partner Violence:   . Fear of Current or Ex-Partner: Not on file  . Emotionally Abused: Not on file  . Physically Abused: Not on file  . Sexually Abused: Not on file    Family History:    Family History  Problem Relation Age of Onset  . Hypertension Mother   . Hyperlipidemia  Mother   . Hypertension Maternal Grandmother   . Heart disease Maternal Grandmother      ROS:  Please see the history of present illness.   All other ROS reviewed and negative.     Physical Exam/Data:   Vitals:   05/20/19 0806 05/20/19 0938 05/20/19 1532 05/20/19 1618  BP: (!) 184/122 (!) 188/132 (!) 183/119 (!) 162/94  Pulse:      Resp:    20  Temp:    98.8 F (37.1 C)  TempSrc:    Oral  SpO2:      Weight:      Height:  Intake/Output Summary (Last 24 hours) at 05/20/2019 1712 Last data filed at 05/20/2019 0926 Gross per 24 hour  Intake 1050 ml  Output 1200 ml  Net -150 ml   Last 3 Weights 05/20/2019 05/19/2019 12/13/2016  Weight (lbs) 218 lb 14.7 oz 219 lb 230 lb  Weight (kg) 99.3 kg 99.338 kg 104.327 kg     Body mass index is 28.89 kg/m.  General:  Well nourished, well developed, in no acute distress HEENT: normal Lymph: no adenopathy Neck: no JVD Endocrine:  No thryomegaly Vascular: No carotid bruits; FA pulses 2+ bilaterally without bruits  Cardiac:  normal S1, S2; RRR; no murmur  Lungs:  clear to auscultation bilaterally, no wheezing, rhonchi or rales  Abd: soft, nontender, no hepatomegaly  Ext: no edema Musculoskeletal:  No deformities, BUE and BLE strength normal and equal Skin: warm and dry  Neuro:  CNs 2-12 intact, no focal abnormalities noted Psych:  Normal affect   EKG:  The EKG was personally reviewed and demonstrates: Normal sinus rhythm  without significant ST-T wave changes Telemetry:  Telemetry was personally reviewed and demonstrates: Normal sinus rhythm without significant ventricular ectopy  Relevant CV Studies:  Echo 05/20/2019 IMPRESSIONS    1. Mild global reduction in LV systolic function; moderate LVH; grade 1  diastolic dysfunction.  2. Left ventricular ejection fraction, by estimation, is 45 to 50%. The  left ventricle has mildly decreased function. The left ventricle  demonstrates global hypokinesis. There is moderate left  ventricular  hypertrophy. Left ventricular diastolic  parameters are consistent with Grade I diastolic dysfunction (impaired  relaxation).  3. Right ventricular systolic function is normal. The right ventricular  size is normal.  4. The mitral valve is normal in structure. Trivial mitral valve  regurgitation. No evidence of mitral stenosis.  5. The aortic valve is normal in structure. Aortic valve regurgitation is  not visualized. No aortic stenosis is present.  6. The inferior vena cava is normal in size with greater than 50%  respiratory variability, suggesting right atrial pressure of 3 mmHg.   Laboratory Data:  High Sensitivity Troponin:  No results for input(s): TROPONINIHS in the last 720 hours.   Chemistry Recent Labs  Lab 05/19/19 1652 05/19/19 2137 05/20/19 0627  NA 133* 134* 138  K 2.6* 2.6* 3.2*  CL 92* 95* 99  CO2 29 29 28   GLUCOSE 108* 114* 124*  BUN 19 17 12   CREATININE 1.72* 1.76* 1.72*  CALCIUM 9.5 9.1 9.1  GFRNONAA 49* 48* 49*  GFRAA 57* 55* 57*  ANIONGAP 12 10 11     No results for input(s): PROT, ALBUMIN, AST, ALT, ALKPHOS, BILITOT in the last 168 hours. Hematology Recent Labs  Lab 05/19/19 1652 05/20/19 0627  WBC 9.8 8.1  RBC 4.99 5.02  HGB 14.0 13.6  HCT 41.5 40.8  MCV 83.2 81.3  MCH 28.1 27.1  MCHC 33.7 33.3  RDW 16.1* 15.8*  PLT 564* 557*   BNPNo results for input(s): BNP, PROBNP in the last 168 hours.  DDimer No results for input(s): DDIMER in the last 168 hours.   Radiology/Studies:  CT Head Wo Contrast  Result Date: 05/19/2019 CLINICAL DATA:  Diffuse right body numbness for 2-3 minutes at noon today. Uncontrolled hypertension despite medication. EXAM: CT HEAD WITHOUT CONTRAST TECHNIQUE: Contiguous axial images were obtained from the base of the skull through the vertex without intravenous contrast. COMPARISON:  12/11/2016 FINDINGS: Brain: Normal appearing cerebral hemispheres and posterior fossa structures. Normal size and position  of the ventricles. No  intracranial hemorrhage, mass lesion or CT evidence of acute infarction. Vascular: No hyperdense vessel or unexpected calcification. Skull: Normal. Negative for fracture or focal lesion. Sinuses/Orbits: Marked left maxillary sinus mucosal thickening. Unremarkable orbits. Other: None. IMPRESSION: 1. No intracranial abnormality. 2. Marked chronic left maxillary sinusitis. Electronically Signed   By: Claudie Revering M.D.   On: 05/19/2019 17:16   MR BRAIN WO CONTRAST  Result Date: 05/20/2019 CLINICAL DATA:  TIA.  Right-sided numbness today. EXAM: MRI HEAD WITHOUT CONTRAST TECHNIQUE: Multiplanar, multiecho pulse sequences of the brain and surrounding structures were obtained without intravenous contrast. COMPARISON:  Head CT from yesterday FINDINGS: Brain: No acute infarction, hemorrhage, hydrocephalus, extra-axial collection or mass lesion. There are a few tiny FLAIR hyperintensities in the cerebral white matter with chronic lacunes at the right sub cortical parasagittal frontal lobe and at the posterior right putamen. Brain volume is normal. No chronic microhemorrhages Vascular: Intracranial vessels are tortuous, likely related to history of chronic hypertension Skull and upper cervical spine: Normal Sinuses/Orbits: Negative orbits. Chronic maxillary and left ethmoid sinus disease that is most notable in the left maxillary sinus where there is atelectasis and marked mucosal thickening around central inspissated material. A polyp is seen in the left nasal cavity which measures 16 mm. Other: Motion degraded study. IMPRESSION: 1. No acute finding. 2. Mild white matter disease with lacunes and intracranial vascular tortuosity correlating with history of chronic hypertension. 3. Chronic sinusitis, most notable at the left maxillary sinus where there is an associated middle meatus and nasal cavity polyp. Recommend elective ENT referral. Electronically Signed   By: Monte Fantasia M.D.   On: 05/20/2019  04:47   ECHOCARDIOGRAM COMPLETE  Result Date: 05/20/2019    ECHOCARDIOGRAM REPORT   Patient Name:   Russell Hernandez Date of Exam: 05/20/2019 Medical Rec #:  [id number]           Height:       73.0 in Accession #:    [id number]          Weight:       218.9 lb Date of Birth:  11/07/1980          BSA:          2.236 m Patient Age:    52 years            BP:           188/132 mmHg Patient Gender: M                   HR:           77 bpm. Exam Location:  Inpatient Procedure: 2D Echo Indications:    TIA  History:        Patient has no prior history of Echocardiogram examinations.                 Risk Factors:Hypertension.  Sonographer:    Russell Hernandez RDCS (AE) Referring Phys: [id number] Charlesetta Ivory GONFA IMPRESSIONS  1. Mild global reduction in LV systolic function; moderate LVH; grade 1 diastolic dysfunction.  2. Left ventricular ejection fraction, by estimation, is 45 to 50%. The left ventricle has mildly decreased function. The left ventricle demonstrates global hypokinesis. There is moderate left ventricular hypertrophy. Left ventricular diastolic parameters are consistent with Grade I diastolic dysfunction (impaired relaxation).  3. Right ventricular systolic function is normal. The right ventricular size is normal.  4. The mitral valve is normal in structure. Trivial mitral valve regurgitation. No evidence of mitral  stenosis.  5. The aortic valve is normal in structure. Aortic valve regurgitation is not visualized. No aortic stenosis is present.  6. The inferior vena cava is normal in size with greater than 50% respiratory variability, suggesting right atrial pressure of 3 mmHg. FINDINGS  Left Ventricle: Left ventricular ejection fraction, by estimation, is 45 to 50%. The left ventricle has mildly decreased function. The left ventricle demonstrates global hypokinesis. The left ventricular internal cavity size was normal in size. There is  moderate left ventricular hypertrophy. Left ventricular diastolic  parameters are consistent with Grade I diastolic dysfunction (impaired relaxation). Right Ventricle: The right ventricular size is normal.Right ventricular systolic function is normal. Left Atrium: Left atrial size was normal in size. Right Atrium: Right atrial size was normal in size. Pericardium: Trivial pericardial effusion is present. Mitral Valve: The mitral valve is normal in structure. Normal mobility of the mitral valve leaflets. Trivial mitral valve regurgitation. No evidence of mitral valve stenosis. Tricuspid Valve: The tricuspid valve is normal in structure. Tricuspid valve regurgitation is trivial. No evidence of tricuspid stenosis. Aortic Valve: The aortic valve is normal in structure. Aortic valve regurgitation is not visualized. No aortic stenosis is present. Pulmonic Valve: The pulmonic valve was normal in structure. Pulmonic valve regurgitation is not visualized. No evidence of pulmonic stenosis. Aorta: The aortic root is normal in size and structure. Venous: The inferior vena cava is normal in size with greater than 50% respiratory variability, suggesting right atrial pressure of 3 mmHg. IAS/Shunts: No atrial level shunt detected by color flow Doppler. Additional Comments: Mild global reduction in LV systolic function; moderate LVH; grade 1 diastolic dysfunction.  LEFT VENTRICLE PLAX 2D LVIDd:         5.00 cm  Diastology LVIDs:         3.60 cm  LV e' lateral:   6.48 cm/s LV PW:         1.40 cm  LV E/e' lateral: 9.4 LV IVS:        1.30 cm  LV e' medial:    4.85 cm/s LVOT diam:     2.50 cm  LV E/e' medial:  12.6 LV SV:         103 LV SV Index:   46 LVOT Area:     4.91 cm  RIGHT VENTRICLE             IVC RV Basal diam:  3.20 cm     IVC diam: 2.10 cm RV Mid diam:    3.70 cm RV S prime:     16.20 cm/s TAPSE (M-mode): 2.6 cm LEFT ATRIUM             Index       RIGHT ATRIUM           Index LA diam:        4.50 cm 2.01 cm/m  RA Area:     16.00 cm LA Vol (A2C):   37.4 ml 16.73 ml/m RA Volume:   36.00  ml  16.10 ml/m LA Vol (A4C):   49.2 ml 22.00 ml/m LA Biplane Vol: 45.9 ml 20.53 ml/m  AORTIC VALVE LVOT Vmax:   124.00 cm/s LVOT Vmean:  79.100 cm/s LVOT VTI:    0.210 m  AORTA Ao Root diam: 3.60 cm MITRAL VALVE MV Area (PHT): 3.83 cm    SHUNTS MV Decel Time: 198 msec    Systemic VTI:  0.21 m MV E velocity: 60.90 cm/s  Systemic Diam: 2.50 cm MV A  velocity: 69.00 cm/s MV E/A ratio:  0.88 Kirk Ruths MD Electronically signed by Kirk Ruths MD Signature Date/Time: 05/20/2019/3:29:32 PM    Final     Assessment and Plan:   1. Newly diagnosed cardiomyopathy: Likely related to chronically uncontrolled hypertension.  According to the patient, he recently missed a week of medication as he ran out of his BP meds.  This has happened in the past.  According to the patient, even after he take his blood pressure medication, he still feel his blood pressure is high.  This is supported by echocardiogram showing EF 45 to 50%, moderate LVH and proteinuria with worsening renal function.  -We will continue to titrate his blood pressure medication.  Avoid thiazide diuretic given severe hypokalemia.  2. Right-sided numbness: Rule out of CVA by CT and MRI  3. Hypertensive emergency: Consider hold chlorthalidone given hypokalemia.  In the future, may consider addition of ACEI/ARB to help with proteinuria in the setting of prediabetes.  Initiation of ACEI/ARB has to be carefully weighed against his renal function.  For now, we will add Coreg 6.25 mg twice daily.  No sign of heart failure on physical exam.  4. Hyperlipidemia: LDL 232, total cholesterol 298. Uptitrate lipitor to 80mg  daily.   5. Prediabetes: Hemoglobin A1c obtained during this admission was 6.2.  Discussed with the patient regarding glucose control  6. CKD stage III: Unclear baseline, however likely CKD since his creatinine was already 1.5 back in 2018.  Likely related to chronically uncontrolled high blood pressure  7. Severe hypokalemia: Potassium  2.6 on arrival, related to thiazide diuretic? K was 3.2 this morning  8. Tobacco abuse  9. Daily marijuana use       For questions or updates, please contact [place] Please consult [email or web address] for contact info under     Hilbert Corrigan, [place]  05/20/2019 5:12 PM  Patient seen and examined. Agree with assessment and plan.  Russell Hernandez is a 39 year old African-American male who has history of hypertension, tobacco abuse, and daily marijuana use.  He had been incarcerated for 7 years and left prison 4 years ago.  He believes the last 2 years while he was in prison he was started on blood pressure medication for hypertension.  Over the past 4 years since being out of prison he has been intermittently taking his medications.  In the past he had been on lisinopril but states his swelled secondary to this raising concern for possible angioedema.  More recently, he has been on chlorthalidone 25 mg in addition to amlodipine 10 mg daily.  He presented to the hospital yesterday with right-sided numbness.  Stroke work-up was negative for acute CVA including a head CT and MRI.  Initial laboratory was notable for hypokalemia with potassium 2.6 and increased serum creatinine at 1.72.  He was found to be markedly hypertensive with a blood pressure [id number] systolic readings at 0000000.  He has been given potassium supplementation and was started on hydralazine 50 mg every 8 hours.  Echo Doppler study has shown an EF of 45 to 50% with moderate left ventricular hypertrophy and grade 1 diastolic dysfunction.  Additional laboratory is notable for marked hyperlipidemia with total cholesterol 298, and LDL cholesterol 232, highly suggestive of probable familial hyperlipidemic pattern.  He also had significant triglyceride elevation and low HDL consistent with an atherogenic dyslipidemic pattern.  Hemoglobin A1c is increased suggestive of prediabetes.  On exam presently, he is in no acute distress.  Pulse is in  the upper 80s, blood pressure mains elevated but improved at 162/94.  Sclera anicteric.  Did not examine his fundi.  No JVD or carotid bruits.  He has decreased breath sounds without wheezing or rhonchi consistent with his longstanding tobacco use.  Rhythm is regular with 1/6 systolic murmur.  I did not appreciate a significant gallop.  I did not hear any renal artery bruits.  Bowel sounds are positive.  There was no edema.  ECG reveals sinus rhythm at 80 bpm with LVH and repolarization changes.  I had a long talk with the patient today.  I discussed with him optimal blood pressure and significant increased risk for CVA and progressive kidney dysfunction leading to dialysis if blood pressure is not under control.  We also discussed potential effects with reference to coronary plaque with ischemia particularly in the setting of his marked hyperlipidemia and significant hypertension with daily tobacco and marijuana use.  At present, blood pressure has improved but remains elevated after only received several doses of hydralazine.  Will add carvedilol 6.25 mg twice daily to his medical regimen particularly with his heart rate in the upper 80s.  If blood pressure continues to be significantly elevated tomorrow would further titrate hydralazine to 75 mg every 8 hours.  Agree with potential secondary hypertensive evaluation to assess for possible hyperaldosteronism particularly with his low potassium level on presentation.  With a questionable history of tongue swelling with lisinopril in the past, would avoid ACE inhibition or ARB therapy due to potential angioedema side effect.  Will follow.   Troy Sine, MD, [facility] 05/20/2019 5:44 PM

## 2019-05-21 ENCOUNTER — Inpatient Hospital Stay (HOSPITAL_COMMUNITY): Payer: Self-pay

## 2019-05-21 DIAGNOSIS — I160 Hypertensive urgency: Secondary | ICD-10-CM

## 2019-05-21 DIAGNOSIS — I10 Essential (primary) hypertension: Secondary | ICD-10-CM

## 2019-05-21 DIAGNOSIS — G459 Transient cerebral ischemic attack, unspecified: Secondary | ICD-10-CM

## 2019-05-21 DIAGNOSIS — I5043 Acute on chronic combined systolic (congestive) and diastolic (congestive) heart failure: Secondary | ICD-10-CM

## 2019-05-21 DIAGNOSIS — D473 Essential (hemorrhagic) thrombocythemia: Secondary | ICD-10-CM

## 2019-05-21 HISTORY — DX: Transient cerebral ischemic attack, unspecified: G45.9

## 2019-05-21 LAB — RENAL FUNCTION PANEL
Albumin: 3.7 g/dL (ref 3.5–5.0)
Anion gap: 14 (ref 5–15)
BUN: 14 mg/dL (ref 6–20)
CO2: 23 mmol/L (ref 22–32)
Calcium: 9.7 mg/dL (ref 8.9–10.3)
Chloride: 102 mmol/L (ref 98–111)
Creatinine, Ser: 1.73 mg/dL — ABNORMAL HIGH (ref 0.61–1.24)
GFR calc Af Amer: 56 mL/min — ABNORMAL LOW (ref >60)
GFR calc non Af Amer: 49 mL/min — ABNORMAL LOW (ref >60)
Glucose, Bld: 129 mg/dL — ABNORMAL HIGH (ref 70–99)
Phosphorus: 3.4 mg/dL (ref 2.5–4.6)
Potassium: 3.5 mmol/L (ref 3.5–5.1)
Sodium: 139 mmol/L (ref 135–145)

## 2019-05-21 LAB — CBC
HCT: 42.7 % (ref 39.0–52.0)
Hemoglobin: 14.2 g/dL (ref 13.0–17.0)
MCH: 27.4 pg (ref 26.0–34.0)
MCHC: 33.3 g/dL (ref 30.0–36.0)
MCV: 82.4 fL (ref 80.0–100.0)
Platelets: 605 10*3/uL — ABNORMAL HIGH (ref 150–400)
RBC: 5.18 MIL/uL (ref 4.22–5.81)
RDW: 16 % — ABNORMAL HIGH (ref 11.5–15.5)
WBC: 8.7 10*3/uL (ref 4.0–10.5)
nRBC: 0 % (ref 0.0–0.2)

## 2019-05-21 LAB — MAGNESIUM: Magnesium: 2.2 mg/dL (ref 1.7–2.4)

## 2019-05-21 MED ORDER — POTASSIUM CHLORIDE CRYS ER 20 MEQ PO TBCR
40.0000 meq | EXTENDED_RELEASE_TABLET | ORAL | Status: AC
  Administered 2019-05-21 (×2): 40 meq via ORAL
  Filled 2019-05-21 (×2): qty 2

## 2019-05-21 MED ORDER — SPIRONOLACTONE 25 MG PO TABS
25.0000 mg | ORAL_TABLET | Freq: Every day | ORAL | Status: DC
  Administered 2019-05-21 – 2019-05-22 (×2): 25 mg via ORAL
  Filled 2019-05-21 (×2): qty 1

## 2019-05-21 MED ORDER — HYDRALAZINE HCL 50 MG PO TABS
75.0000 mg | ORAL_TABLET | Freq: Three times a day (TID) | ORAL | Status: DC
  Administered 2019-05-21 – 2019-05-25 (×12): 75 mg via ORAL
  Filled 2019-05-21 (×12): qty 1

## 2019-05-21 MED ORDER — CLONIDINE HCL 0.1 MG PO TABS
0.1000 mg | ORAL_TABLET | Freq: Two times a day (BID) | ORAL | Status: DC
  Administered 2019-05-21 – 2019-05-23 (×4): 0.1 mg via ORAL
  Filled 2019-05-21 (×4): qty 1

## 2019-05-21 MED ORDER — TRAZODONE HCL 50 MG PO TABS: 50.0000 mg | ORAL_TABLET | Freq: Every evening | ORAL | Status: DC | PRN

## 2019-05-21 MED ORDER — CARVEDILOL 12.5 MG PO TABS
12.5000 mg | ORAL_TABLET | Freq: Two times a day (BID) | ORAL | Status: DC
  Administered 2019-05-22 – 2019-05-23 (×4): 12.5 mg via ORAL
  Filled 2019-05-21 (×4): qty 1

## 2019-05-21 NOTE — Plan of Care (Signed)
  Problem: Education: Goal: Knowledge of secondary prevention will improve Outcome: Progressing   Problem: Education: Goal: Knowledge of General Education information will improve Description: Including pain rating scale, medication(s)/side effects and non-pharmacologic comfort measures Outcome: Progressing   Problem: Health Behavior/Discharge Planning: Goal: Ability to manage health-related needs will improve Outcome: Progressing   Problem: Clinical Measurements: Goal: Respiratory complications will improve Outcome: Progressing   Problem: Activity: Goal: Risk for activity intolerance will decrease Outcome: Progressing   Problem: Nutrition: Goal: Adequate nutrition will be maintained Outcome: Progressing   Problem: Coping: Goal: Level of anxiety will decrease Outcome: Progressing

## 2019-05-21 NOTE — Progress Notes (Signed)
Progress Note  Patient Name: Russell Hernandez Date of Encounter: 05/21/2019  Primary Cardiologist: new  Subjective   No chest pain or shortness of breath  Inpatient Medications    Scheduled Meds: . amLODipine  10 mg Oral Daily  . aspirin EC  81 mg Oral Daily  . atorvastatin  20 mg Oral q1800  . carvedilol  6.25 mg Oral BID WC  . chlorthalidone  25 mg Oral Daily  . heparin  5,000 Units Subcutaneous Q8H  . hydrALAZINE  75 mg Oral Q8H  . nicotine  21 mg Transdermal Daily  . ramelteon  8 mg Oral QHS  . sodium chloride flush  3 mL Intravenous Q12H  . spironolactone  25 mg Oral Daily   Continuous Infusions: . sodium chloride 250 mL (05/20/19 0702)   PRN Meds: sodium chloride, acetaminophen, albuterol, alum & mag hydroxide-simeth, labetalol, sodium chloride flush, traMADol   Vital Signs    Vitals:   05/21/19 0356 05/21/19 0700 05/21/19 0748 05/21/19 1241  BP: (!) 188/120 (!) 171/102  (!) 181/118  Pulse: 98     Resp: 20 18  (!) 24  Temp: 98.4 F (36.9 C)  98.4 F (36.9 C) 97.7 F (36.5 C)  TempSrc: Oral  Oral Oral  SpO2: 98%     Weight: 88.9 kg     Height:       No intake or output data in the 24 hours ending 05/21/19 1430  I/O since admission:   Filed Weights   05/19/19 1609 05/20/19 0100 05/21/19 0356  Weight: 99.3 kg 99.3 kg 88.9 kg    Telemetry    Sinus rhythm without ectopy in the 80s- Personally Reviewed  ECG    ECG (independently read by me): Normal sinus rhythm at 80, left atrial enlargement, LVH with repolarization changes.  QTc interval 477 ms  Physical Exam   BP (!) 181/118 (BP Location: Left Arm)   Pulse 98   Temp 97.7 F (36.5 C) (Oral)   Resp (!) 24   Ht 6' 0.99" (1.854 m) Comment: 6'1  Wt 88.9 kg   SpO2 98%   BMI 25.86 kg/m  General: Alert, oriented, no distress.  Skin: normal turgor, no rashes, warm and dry; multiple tattoos HEENT: Normocephalic, atraumatic. Pupils equal round and reactive to light; sclera anicteric;  extraocular muscles intact;  Nose without nasal septal hypertrophy Mouth/Parynx benign;  Neck: No JVD, no carotid bruits; normal carotid upstroke Lungs: clear to ausculatation and percussion; no wheezing or rales Chest wall: without tenderness to palpitation Heart: PMI not displaced, RRR, s1 s2 normal, 1/6 systolic murmur, no diastolic murmur, no rubs, gallops, thrills, or heaves Abdomen: soft, nontender; no hepatosplenomehaly, BS+; abdominal aorta nontender and not dilated by palpation. Back: no CVA tenderness Pulses 2+ Musculoskeletal: full range of motion, normal strength, no joint deformities Extremities: no clubbing cyanosis or edema, Homan's sign negative  Neurologic: grossly nonfocal; Cranial nerves grossly wnl Psychologic: Normal mood and affect   Labs    Chemistry Recent Labs  Lab 05/19/19 2137 05/20/19 0627 05/21/19 0142  NA 134* 138 139  K 2.6* 3.2* 3.5  CL 95* 99 102  CO2 29 28 23   GLUCOSE 114* 124* 129*  BUN 17 12 14   CREATININE 1.76* 1.72* 1.73*  CALCIUM 9.1 9.1 9.7  ALBUMIN  --   --  3.7  GFRNONAA 48* 49* 49*  GFRAA 55* 57* 56*  ANIONGAP 10 11 14      Hematology Recent Labs  Lab 05/19/19 1652 05/20/19 [id number]  05/21/19 0142  WBC 9.8 8.1 8.7  RBC 4.99 5.02 5.18  HGB 14.0 13.6 14.2  HCT 41.5 40.8 42.7  MCV 83.2 81.3 82.4  MCH 28.1 27.1 27.4  MCHC 33.7 33.3 33.3  RDW 16.1* 15.8* 16.0*  PLT 564* 557* 605*    Cardiac EnzymesNo results for input(s): TROPONINI in the last 168 hours. No results for input(s): TROPIPOC in the last 168 hours.   BNP Recent Labs  Lab 05/20/19 1808  BNP 99.6     DDimer No results for input(s): DDIMER in the last 168 hours.   Lipid Panel     Component Value Date/Time   CHOL 298 (H) 05/20/2019 0627   TRIG 173 (H) 05/20/2019 0627   HDL 31 (L) 05/20/2019 0627   CHOLHDL 9.6 05/20/2019 0627   VLDL 35 05/20/2019 0627   LDLCALC 232 (H) 05/20/2019 0627     Radiology    CT Head Wo Contrast  Result Date:  05/19/2019 CLINICAL DATA:  Diffuse right body numbness for 2-3 minutes at noon today. Uncontrolled hypertension despite medication. EXAM: CT HEAD WITHOUT CONTRAST TECHNIQUE: Contiguous axial images were obtained from the base of the skull through the vertex without intravenous contrast. COMPARISON:  12/11/2016 FINDINGS: Brain: Normal appearing cerebral hemispheres and posterior fossa structures. Normal size and position of the ventricles. No intracranial hemorrhage, mass lesion or CT evidence of acute infarction. Vascular: No hyperdense vessel or unexpected calcification. Skull: Normal. Negative for fracture or focal lesion. Sinuses/Orbits: Marked left maxillary sinus mucosal thickening. Unremarkable orbits. Other: None. IMPRESSION: 1. No intracranial abnormality. 2. Marked chronic left maxillary sinusitis. Electronically Signed   By: Claudie Revering M.D.   On: 05/19/2019 17:16   MR BRAIN WO CONTRAST  Result Date: 05/20/2019 CLINICAL DATA:  TIA.  Right-sided numbness today. EXAM: MRI HEAD WITHOUT CONTRAST TECHNIQUE: Multiplanar, multiecho pulse sequences of the brain and surrounding structures were obtained without intravenous contrast. COMPARISON:  Head CT from yesterday FINDINGS: Brain: No acute infarction, hemorrhage, hydrocephalus, extra-axial collection or mass lesion. There are a few tiny FLAIR hyperintensities in the cerebral white matter with chronic lacunes at the right sub cortical parasagittal frontal lobe and at the posterior right putamen. Brain volume is normal. No chronic microhemorrhages Vascular: Intracranial vessels are tortuous, likely related to history of chronic hypertension Skull and upper cervical spine: Normal Sinuses/Orbits: Negative orbits. Chronic maxillary and left ethmoid sinus disease that is most notable in the left maxillary sinus where there is atelectasis and marked mucosal thickening around central inspissated material. A polyp is seen in the left nasal cavity which measures 16 mm.  Other: Motion degraded study. IMPRESSION: 1. No acute finding. 2. Mild white matter disease with lacunes and intracranial vascular tortuosity correlating with history of chronic hypertension. 3. Chronic sinusitis, most notable at the left maxillary sinus where there is an associated middle meatus and nasal cavity polyp. Recommend elective ENT referral. Electronically Signed   By: Monte Fantasia M.D.   On: 05/20/2019 04:47   ECHOCARDIOGRAM COMPLETE  Result Date: 05/20/2019    ECHOCARDIOGRAM REPORT   Patient Name:   BERIC PANAGAKOS Date of Exam: 05/20/2019 Medical Rec #:  [id number]           Height:       73.0 in Accession #:    [id number]          Weight:       218.9 lb Date of Birth:  Jan 09, 1981          BSA:  2.236 m Patient Age:    38 years            BP:           188/132 mmHg Patient Gender: M                   HR:           77 bpm. Exam Location:  Inpatient Procedure: 2D Echo Indications:    TIA  History:        Patient has no prior history of Echocardiogram examinations.                 Risk Factors:Hypertension.  Sonographer:    Mikki Santee RDCS (AE) Referring Phys: [id number] Charlesetta Ivory GONFA IMPRESSIONS  1. Mild global reduction in LV systolic function; moderate LVH; grade 1 diastolic dysfunction.  2. Left ventricular ejection fraction, by estimation, is 45 to 50%. The left ventricle has mildly decreased function. The left ventricle demonstrates global hypokinesis. There is moderate left ventricular hypertrophy. Left ventricular diastolic parameters are consistent with Grade I diastolic dysfunction (impaired relaxation).  3. Right ventricular systolic function is normal. The right ventricular size is normal.  4. The mitral valve is normal in structure. Trivial mitral valve regurgitation. No evidence of mitral stenosis.  5. The aortic valve is normal in structure. Aortic valve regurgitation is not visualized. No aortic stenosis is present.  6. The inferior vena cava is normal in size with  greater than 50% respiratory variability, suggesting right atrial pressure of 3 mmHg. FINDINGS  Left Ventricle: Left ventricular ejection fraction, by estimation, is 45 to 50%. The left ventricle has mildly decreased function. The left ventricle demonstrates global hypokinesis. The left ventricular internal cavity size was normal in size. There is  moderate left ventricular hypertrophy. Left ventricular diastolic parameters are consistent with Grade I diastolic dysfunction (impaired relaxation). Right Ventricle: The right ventricular size is normal.Right ventricular systolic function is normal. Left Atrium: Left atrial size was normal in size. Right Atrium: Right atrial size was normal in size. Pericardium: Trivial pericardial effusion is present. Mitral Valve: The mitral valve is normal in structure. Normal mobility of the mitral valve leaflets. Trivial mitral valve regurgitation. No evidence of mitral valve stenosis. Tricuspid Valve: The tricuspid valve is normal in structure. Tricuspid valve regurgitation is trivial. No evidence of tricuspid stenosis. Aortic Valve: The aortic valve is normal in structure. Aortic valve regurgitation is not visualized. No aortic stenosis is present. Pulmonic Valve: The pulmonic valve was normal in structure. Pulmonic valve regurgitation is not visualized. No evidence of pulmonic stenosis. Aorta: The aortic root is normal in size and structure. Venous: The inferior vena cava is normal in size with greater than 50% respiratory variability, suggesting right atrial pressure of 3 mmHg. IAS/Shunts: No atrial level shunt detected by color flow Doppler. Additional Comments: Mild global reduction in LV systolic function; moderate LVH; grade 1 diastolic dysfunction.  LEFT VENTRICLE PLAX 2D LVIDd:         5.00 cm  Diastology LVIDs:         3.60 cm  LV e' lateral:   6.48 cm/s LV PW:         1.40 cm  LV E/e' lateral: 9.4 LV IVS:        1.30 cm  LV e' medial:    4.85 cm/s LVOT diam:     2.50 cm   LV E/e' medial:  12.6 LV SV:  103 LV SV Index:   46 LVOT Area:     4.91 cm  RIGHT VENTRICLE             IVC RV Basal diam:  3.20 cm     IVC diam: 2.10 cm RV Mid diam:    3.70 cm RV S prime:     16.20 cm/s TAPSE (M-mode): 2.6 cm LEFT ATRIUM             Index       RIGHT ATRIUM           Index LA diam:        4.50 cm 2.01 cm/m  RA Area:     16.00 cm LA Vol (A2C):   37.4 ml 16.73 ml/m RA Volume:   36.00 ml  16.10 ml/m LA Vol (A4C):   49.2 ml 22.00 ml/m LA Biplane Vol: 45.9 ml 20.53 ml/m  AORTIC VALVE LVOT Vmax:   124.00 cm/s LVOT Vmean:  79.100 cm/s LVOT VTI:    0.210 m  AORTA Ao Root diam: 3.60 cm MITRAL VALVE MV Area (PHT): 3.83 cm    SHUNTS MV Decel Time: 198 msec    Systemic VTI:  0.21 m MV E velocity: 60.90 cm/s  Systemic Diam: 2.50 cm MV A velocity: 69.00 cm/s MV E/A ratio:  0.88 Kirk Ruths MD Electronically signed by Kirk Ruths MD Signature Date/Time: 05/20/2019/3:29:32 PM    Final    VAS US RENAL ARTERY DUPLEX  Result Date: 05/21/2019 ABDOMINAL VISCERAL Indications: Hypertension Comparison Study: No prior exam. Performing Technologist: Baldwin Crown RDMS, RVT  Examination Guidelines: A complete evaluation includes B-mode imaging, spectral Doppler, color Doppler, and power Doppler as needed of all accessible portions of each vessel. Bilateral testing is considered an integral part of a complete examination. Limited examinations for reoccurring indications may be performed as noted.  Duplex Findings: +--------------------+--------+--------+------+--------+ Mesenteric          PSV cm/sEDV cm/sPlaqueComments +--------------------+--------+--------+------+--------+ Aorta Prox            107      19                  +--------------------+--------+--------+------+--------+ Celiac Artery Origin  146      48                  +--------------------+--------+--------+------+--------+ SMA Proximal          157      23                   +--------------------+--------+--------+------+--------+    +------------------+--------+--------+-------+ Right Renal ArteryPSV cm/sEDV cm/sComment +------------------+--------+--------+-------+ Origin               65      23           +------------------+--------+--------+-------+ Proximal             83      29           +------------------+--------+--------+-------+ Mid                  99      38           +------------------+--------+--------+-------+ Distal               46      20           +------------------+--------+--------+-------+ +-----------------+--------+--------+-------+ Left Renal ArteryPSV cm/sEDV cm/sComment +-----------------+--------+--------+-------+ Origin  52      16           +-----------------+--------+--------+-------+ Proximal            68      17           +-----------------+--------+--------+-------+ Mid                 59      19           +-----------------+--------+--------+-------+ Distal              66      23           +-----------------+--------+--------+-------+ +------------+--------+--------+----+-----------+--------+--------+----+ Right KidneyPSV cm/sEDV cm/sRI  Left KidneyPSV cm/sEDV cm/sRI   +------------+--------+--------+----+-----------+--------+--------+----+ Upper Pole  36      16      0.57Upper Pole 68      30      0.56 +------------+--------+--------+----+-----------+--------+--------+----+ Mid         36      16      0.57Mid        51      20      0.60 +------------+--------+--------+----+-----------+--------+--------+----+ Lower Pole  34      16      0.54Lower Pole 75      29      0.61 +------------+--------+--------+----+-----------+--------+--------+----+ Hilar       38      16      0.58Hilar      60      22      0.63 +------------+--------+--------+----+-----------+--------+--------+----+ +------------------+-----+------------------+-----+ Right  Kidney           Left Kidney             +------------------+-----+------------------+-----+ RAR                    RAR                     +------------------+-----+------------------+-----+ RAR (manual)      0.92 RAR (manual)      0.64  +------------------+-----+------------------+-----+ Cortex                 Cortex                  +------------------+-----+------------------+-----+ Cortex thickness       Corex thickness         +------------------+-----+------------------+-----+ Kidney length (cm)12.00Kidney length (cm)11.20 +------------------+-----+------------------+-----+   Summary: Renal:  Right: Normal size right kidney. No evidence of right renal artery        stenosis. Normal right Resisitive Index. RRV flow present. Left:  Normal size of left kidney. No evidence of left renal artery        stenosis. Normal left Resistive Index. LRV flow present. Mesenteric: Normal Celiac artery and Superior Mesenteric artery findings.  *See table(s) above for measurements and observations.     Preliminary     Cardiac Studies   Echo 05/20/2019 IMPRESSIONS  1. Mild global reduction in LV systolic function; moderate LVH; grade 1  diastolic dysfunction.  2. Left ventricular ejection fraction, by estimation, is 45 to 50%. The  left ventricle has mildly decreased function. The left ventricle  demonstrates global hypokinesis. There is moderate left ventricular  hypertrophy. Left ventricular diastolic  parameters are consistent with Grade I diastolic dysfunction (impaired  relaxation).  3. Right ventricular systolic function is normal. The right ventricular  size is normal.  4. The mitral valve is  normal in structure. Trivial mitral valve  regurgitation. No evidence of mitral stenosis.  5. The aortic valve is normal in structure. Aortic valve regurgitation is  not visualized. No aortic stenosis is present.  6. The inferior vena cava is normal in size with greater than 50%    respiratory variability, suggesting right atrial pressure of 3 mmHg.   Patient Profile     TAVIS VANKLEECK is a 39 y.o. male with a hx of HTN, tobacco abuse and daily marijuana use who was seen for the evaluation of cardiomyopathy at the request of Dr. Cyndia Skeeters.  Assessment & Plan    1.  Hypertensive cardiomyopathy with moderate LVH, EF 45 to 50%.  Suspect secondary to longstanding uncontrolled hypertension.  2.  Accelerated hypertension: BP today continues to be elevated.  Medications have been titrated and now include amlodipine 10 mg, spironolactone 25 mg daily, chlorthalidone 25 mg, hydralazine 75 mg every 8 hours in addition to carvedilol 6.25 mg twice a day.  Will titrate carvedilol to 12.5 mg twice a day today.  Renal duplex scan completed  3.  Transient right-sided numbness: Resolved; no CVA CT and MRI  4.  Marked hyperlipidemia: LDL 232, total cholesterol 298, with significant family history of high cholesterol high likelihood for familial hyperlipidemia.  Now on atorvastatin 80 mg.  5.  Stage III chronic kidney disease: Creatinine 1.73  6.  Hypokalemia: Improving with potassium supplementation.  With severe accelerated hypertension, consider evaluation for hyperaldosteronism.  7.  Tobacco and marijuana use: Strongly advised discontinuance.   Signed, Troy Sine, MD, [facility] 05/21/2019, 2:30 PM

## 2019-05-21 NOTE — Progress Notes (Signed)
PROGRESS NOTE  Russell Hernandez [id number] DOB: [birth year]   PCP: Patient, No Pcp Per  Patient is from: Home.  DOA: 05/19/2019 LOS: 1  Brief Narrative / Interim history: 39 year old male with history of HTN, tobacco use and marijuana use presenting with right-sided numbness that lasted about 2 minutes.  No other associated symptoms.  Has been compliant with his amlodipine and chlorthalidone.  In ED, SBP in the 200s.  DBP in 130s. Na 133.  K2.6. Cr 1.72.  BUN 19.  CBC without significant finding.  UA with a small Hgb, 100 protein and few bacteria.  UDS positive for THC.  COVID-19 negative.  CT head without acute finding.  EKG with LVH and mild SE in anterior leads but doesn't meet criteria for STEMI.  He was admitted for TIA work-up and hypertensive urgency.  MRI brain without acute finding.  Echo with EF of 45 to 50%, global hypokinesis, moderate LVH and G1DD.  Cardiology consulted and made adjustments to blood pressure medications.  Renal Doppler without significant finding.  Blood pressure remains elevated.  Subjective: No major events overnight or this morning.  Reports difficulty sleeping.  He again had brief episode of right face paresthesia this morning.  Also reports headache.  He denies chest pain, dyspnea, focal weakness, GI or UTI symptoms.  Objective: Vitals:   05/21/19 0300 05/21/19 0356 05/21/19 0700 05/21/19 0748  BP:  (!) 188/120 (!) 171/102   Pulse:  98    Resp: 17 20 18    Temp:  98.4 F (36.9 C)  98.4 F (36.9 C)  TempSrc:  Oral  Oral  SpO2:  98%    Weight:  88.9 kg    Height:       No intake or output data in the 24 hours ending 05/21/19 1057 Filed Weights   05/19/19 1609 05/20/19 0100 05/21/19 0356  Weight: 99.3 kg 99.3 kg 88.9 kg    Examination:  GENERAL: No acute distress.  Appears well.  HEENT: MMM.  Vision and hearing grossly intact.  NECK: Supple.  No apparent JVD.  RESP:  No IWOB. Good air movement bilaterally. CVS:  RRR. Heart sounds  normal.  ABD/GI/GU: Bowel sounds present. Soft. Non tender.  MSK/EXT:  Moves extremities. No apparent deformity. No edema.  SKIN: no apparent skin lesion or wound NEURO: Awake, alert and oriented appropriately.  No apparent focal neuro deficit. PSYCH: Calm. Normal affect.  Procedures:  None  Assessment & Plan: New combined CHF: Echo with EF of 45 to 50%, global hypokinesis, moderate LVH and G1DD.  Likely due to uncontrolled hypertension.  No cardiopulmonary symptoms.  BNP 99.6.  Blood pressure remains elevated. -Cardiology consulted-making medication adjustment to optimize blood pressure control -Monitor fluid status, renal function and electrolytes  Hypertensive urgency: BP remains elevated.  UA with small Hgb and 100 protein.  TSH within normal.  Renal duplex without significant finding.  Echocardiogram as above.  No cardiopulmonary symptoms. -May benefit from sleep study-reports snoring and daytime fatigue -Continue amlodipine 10 mg daily, Coreg 6.25 mg twice daily and chlorthalidone 25 mg daily -Increase hydralazine to 75 mg 3 times daily -Follow renin and aldosterone levels  Transient right paresthesia/TIA-likely due to uncontrolled hypertension.  Neuro exam reassuring.  MRI brain without acute finding. -Optimize blood pressure control -LDL 232-started statin.  A1c 6.2%.  Tobacco use disorder -Encouraged cessation. -Nicotine patch  Marijuana use- -counseled.  Elevated creatinine-suspect progressive CKD versus AKI.  BUN within normal.  Baseline Cr ~1.4> 1.72 (admit)> 1.73.  Renal duplex  without significant finding. -Continue monitoring  Prediabetes -Encourage lifestyle change  Hyperlipidemia: LDL 232.  HDL 31. -Started atorvastatin.  Hypokalemia:  -Replenished and recheck  Thrombocytosis: Unclear etiology.  Hgb within normal range.  Normal platelet in 2018. -May benefit from outpatient hematology follow-up if no improvement               DVT prophylaxis:  Subcu heparin Code Status: Full code Family Communication: Updated patient's wife at bedside on 3/8.  Discharge barrier: New CHF and markedly elevated blood pressure.  Needs further evaluation, adjustment of his medications under close monitoring.  Very high risk for adverse outcome.  Patient is from: Home Final disposition: Likely home once medically stable and cleared by cardiology  Consultants: Cardiology   Microbiology summarized: COVID-19 negative MRSA PCR negative  Sch Meds:  Scheduled Meds: . amLODipine  10 mg Oral Daily  . aspirin EC  81 mg Oral Daily  . atorvastatin  20 mg Oral q1800  . carvedilol  6.25 mg Oral BID WC  . chlorthalidone  25 mg Oral Daily  . heparin  5,000 Units Subcutaneous Q8H  . hydrALAZINE  75 mg Oral Q8H  . nicotine  21 mg Transdermal Daily  . potassium chloride  40 mEq Oral Q3H  . ramelteon  8 mg Oral QHS  . sodium chloride flush  3 mL Intravenous Q12H   Continuous Infusions: . sodium chloride 250 mL (05/20/19 0702)   PRN Meds:.sodium chloride, acetaminophen, albuterol, alum & mag hydroxide-simeth, labetalol, sodium chloride flush, traMADol  Antimicrobials: Anti-infectives (From admission, onward)   None       I have personally reviewed the following labs and images: CBC: Recent Labs  Lab 05/19/19 1652 05/20/19 0627 05/21/19 0142  WBC 9.8 8.1 8.7  NEUTROABS 6.0  --   --   HGB 14.0 13.6 14.2  HCT 41.5 40.8 42.7  MCV 83.2 81.3 82.4  PLT 564* 557* 605*   BMP &GFR Recent Labs  Lab 05/19/19 1652 05/19/19 2137 05/20/19 0157 05/20/19 0627 05/21/19 0142  NA 133* 134*  --  138 139  K 2.6* 2.6*  --  3.2* 3.5  CL 92* 95*  --  99 102  CO2 29 29  --  28 23  GLUCOSE 108* 114*  --  124* 129*  BUN 19 17  --  12 14  CREATININE 1.72* 1.76*  --  1.72* 1.73*  CALCIUM 9.5 9.1  --  9.1 9.7  MG  --   --  2.2  --  2.2  PHOS  --   --   --   --  3.4   Estimated Creatinine Clearance: 64.8 mL/min (A) (by C-G formula based on SCr of 1.73  mg/dL (H)). Liver & Pancreas: Recent Labs  Lab 05/21/19 0142  ALBUMIN 3.7   No results for input(s): LIPASE, AMYLASE in the last 168 hours. No results for input(s): AMMONIA in the last 168 hours. Diabetic: Recent Labs    05/20/19 0627  HGBA1C 6.2*   Recent Labs  Lab 05/19/19 1610  GLUCAP 121*   Cardiac Enzymes: No results for input(s): CKTOTAL, CKMB, CKMBINDEX, TROPONINI in the last 168 hours. No results for input(s): PROBNP in the last 8760 hours. Coagulation Profile: Recent Labs  Lab 05/19/19 1652  INR 0.9   Thyroid Function Tests: Recent Labs    05/20/19 2139  TSH 1.894   Lipid Profile: Recent Labs    05/20/19 0627  CHOL 298*  HDL 31*  LDLCALC 232*  TRIG 173*  CHOLHDL 9.6   Anemia Panel: No results for input(s): VITAMINB12, FOLATE, FERRITIN, TIBC, IRON, RETICCTPCT in the last 72 hours. Urine analysis:    Component Value Date/Time   COLORURINE YELLOW 05/19/2019 1642   APPEARANCEUR CLEAR 05/19/2019 1642   LABSPEC 1.020 05/19/2019 1642   PHURINE 6.0 05/19/2019 1642   GLUCOSEU NEGATIVE 05/19/2019 1642   HGBUR SMALL (A) 05/19/2019 1642   BILIRUBINUR NEGATIVE 05/19/2019 1642   KETONESUR NEGATIVE 05/19/2019 1642   PROTEINUR 100 (A) 05/19/2019 1642   NITRITE NEGATIVE 05/19/2019 1642   LEUKOCYTESUR NEGATIVE 05/19/2019 1642   Sepsis Labs: Invalid input(s): PROCALCITONIN, [place]  Microbiology: Recent Results (from the past 240 hour(s))  SARS CORONAVIRUS 2 (TAT 6-24 HRS) Nasopharyngeal Nasopharyngeal Swab     Status: None   Collection Time: 05/19/19 10:42 PM   Specimen: Nasopharyngeal Swab  Result Value Ref Range Status   SARS Coronavirus 2 NEGATIVE NEGATIVE Final    Comment: (NOTE) SARS-CoV-2 target nucleic acids are NOT DETECTED. The SARS-CoV-2 RNA is generally detectable in upper and lower respiratory specimens during the acute phase of infection. Negative results do not preclude SARS-CoV-2 infection, do not rule out co-infections with other  pathogens, and should not be used as the sole basis for treatment or other patient management decisions. Negative results must be combined with clinical observations, patient history, and epidemiological information. The expected result is Negative. Fact Sheet for Patients: SugarRoll.be Fact Sheet for Healthcare Providers: https://www.woods-mathews.com/ This test is not yet approved or cleared by the [place] FDA and  has been authorized for detection and/or diagnosis of SARS-CoV-2 by FDA under an Emergency Use Authorization (EUA). This EUA will remain  in effect (meaning this test can be used) for the duration of the COVID-19 declaration under Section 56 4(b)(1) of the Act, 21 U.S.C. section 360bbb-3(b)(1), unless the authorization is terminated or revoked sooner. Performed at [facility], [place] [address] [place], [place] [address]   MRSA PCR Screening     Status: None   Collection Time: 05/20/19  1:08 AM   Specimen: Nasal Mucosa; Nasopharyngeal  Result Value Ref Range Status   MRSA by PCR NEGATIVE NEGATIVE Final    Comment:        The GeneXpert MRSA Assay (FDA approved for NASAL specimens only), is one component of a comprehensive MRSA colonization surveillance program. It is not intended to diagnose MRSA infection nor to guide or monitor treatment for MRSA infections. Performed at [facility], [place] [address] [place], [place] [address]     Radiology Studies: ECHOCARDIOGRAM COMPLETE  Result Date: 05/20/2019    ECHOCARDIOGRAM REPORT   Patient Name:   JAMESMICHAEL GOODRUM Date of Exam: 05/20/2019 Medical Rec #:  [id number]           Height:       73.0 in Accession #:    [id number]          Weight:       218.9 lb Date of Birth:  [birth year]          BSA:          2.236 m Patient Age:    38 years            BP:           188/132 mmHg Patient Gender: M                   HR:           77 bpm.  Exam Location:  Inpatient  Procedure: 2D Echo Indications:    TIA  History:        Patient has no prior history of Echocardiogram examinations.                 Risk Factors:Hypertension.  Sonographer:    Mikki Santee RDCS (AE) Referring Phys: [id number] Charlesetta Ivory Johnta Couts IMPRESSIONS  1. Mild global reduction in LV systolic function; moderate LVH; grade 1 diastolic dysfunction.  2. Left ventricular ejection fraction, by estimation, is 45 to 50%. The left ventricle has mildly decreased function. The left ventricle demonstrates global hypokinesis. There is moderate left ventricular hypertrophy. Left ventricular diastolic parameters are consistent with Grade I diastolic dysfunction (impaired relaxation).  3. Right ventricular systolic function is normal. The right ventricular size is normal.  4. The mitral valve is normal in structure. Trivial mitral valve regurgitation. No evidence of mitral stenosis.  5. The aortic valve is normal in structure. Aortic valve regurgitation is not visualized. No aortic stenosis is present.  6. The inferior vena cava is normal in size with greater than 50% respiratory variability, suggesting right atrial pressure of 3 mmHg. FINDINGS  Left Ventricle: Left ventricular ejection fraction, by estimation, is 45 to 50%. The left ventricle has mildly decreased function. The left ventricle demonstrates global hypokinesis. The left ventricular internal cavity size was normal in size. There is  moderate left ventricular hypertrophy. Left ventricular diastolic parameters are consistent with Grade I diastolic dysfunction (impaired relaxation). Right Ventricle: The right ventricular size is normal.Right ventricular systolic function is normal. Left Atrium: Left atrial size was normal in size. Right Atrium: Right atrial size was normal in size. Pericardium: Trivial pericardial effusion is present. Mitral Valve: The mitral valve is normal in structure. Normal mobility of the mitral valve leaflets. Trivial mitral valve regurgitation.  No evidence of mitral valve stenosis. Tricuspid Valve: The tricuspid valve is normal in structure. Tricuspid valve regurgitation is trivial. No evidence of tricuspid stenosis. Aortic Valve: The aortic valve is normal in structure. Aortic valve regurgitation is not visualized. No aortic stenosis is present. Pulmonic Valve: The pulmonic valve was normal in structure. Pulmonic valve regurgitation is not visualized. No evidence of pulmonic stenosis. Aorta: The aortic root is normal in size and structure. Venous: The inferior vena cava is normal in size with greater than 50% respiratory variability, suggesting right atrial pressure of 3 mmHg. IAS/Shunts: No atrial level shunt detected by color flow Doppler. Additional Comments: Mild global reduction in LV systolic function; moderate LVH; grade 1 diastolic dysfunction.  LEFT VENTRICLE PLAX 2D LVIDd:         5.00 cm  Diastology LVIDs:         3.60 cm  LV e' lateral:   6.48 cm/s LV PW:         1.40 cm  LV E/e' lateral: 9.4 LV IVS:        1.30 cm  LV e' medial:    4.85 cm/s LVOT diam:     2.50 cm  LV E/e' medial:  12.6 LV SV:         103 LV SV Index:   46 LVOT Area:     4.91 cm  RIGHT VENTRICLE             IVC RV Basal diam:  3.20 cm     IVC diam: 2.10 cm RV Mid diam:    3.70 cm RV S prime:     16.20 cm/s TAPSE (M-mode): 2.6 cm LEFT  ATRIUM             Index       RIGHT ATRIUM           Index LA diam:        4.50 cm 2.01 cm/m  RA Area:     16.00 cm LA Vol (A2C):   37.4 ml 16.73 ml/m RA Volume:   36.00 ml  16.10 ml/m LA Vol (A4C):   49.2 ml 22.00 ml/m LA Biplane Vol: 45.9 ml 20.53 ml/m  AORTIC VALVE LVOT Vmax:   124.00 cm/s LVOT Vmean:  79.100 cm/s LVOT VTI:    0.210 m  AORTA Ao Root diam: 3.60 cm MITRAL VALVE MV Area (PHT): 3.83 cm    SHUNTS MV Decel Time: 198 msec    Systemic VTI:  0.21 m MV E velocity: 60.90 cm/s  Systemic Diam: 2.50 cm MV A velocity: 69.00 cm/s MV E/A ratio:  0.88 Kirk Ruths MD Electronically signed by Kirk Ruths MD Signature Date/Time:  05/20/2019/3:29:32 PM    Final    VAS US RENAL ARTERY DUPLEX  Result Date: 05/21/2019 ABDOMINAL VISCERAL Indications: Hypertension Comparison Study: No prior exam. Performing Technologist: Baldwin Crown RDMS, RVT  Examination Guidelines: A complete evaluation includes B-mode imaging, spectral Doppler, color Doppler, and power Doppler as needed of all accessible portions of each vessel. Bilateral testing is considered an integral part of a complete examination. Limited examinations for reoccurring indications may be performed as noted.  Duplex Findings: +--------------------+--------+--------+------+--------+ Mesenteric          PSV cm/sEDV cm/sPlaqueComments +--------------------+--------+--------+------+--------+ Aorta Prox            107      19                  +--------------------+--------+--------+------+--------+ Celiac Artery Origin  146      48                  +--------------------+--------+--------+------+--------+ SMA Proximal          157      23                  +--------------------+--------+--------+------+--------+    +------------------+--------+--------+-------+ Right Renal ArteryPSV cm/sEDV cm/sComment +------------------+--------+--------+-------+ Origin               65      23           +------------------+--------+--------+-------+ Proximal             83      29           +------------------+--------+--------+-------+ Mid                  99      38           +------------------+--------+--------+-------+ Distal               46      20           +------------------+--------+--------+-------+ +-----------------+--------+--------+-------+ Left Renal ArteryPSV cm/sEDV cm/sComment +-----------------+--------+--------+-------+ Origin              52      16           +-----------------+--------+--------+-------+ Proximal            68      17           +-----------------+--------+--------+-------+ Mid  59       19           +-----------------+--------+--------+-------+ Distal              66      23           +-----------------+--------+--------+-------+ +------------+--------+--------+----+-----------+--------+--------+----+ Right KidneyPSV cm/sEDV cm/sRI  Left KidneyPSV cm/sEDV cm/sRI   +------------+--------+--------+----+-----------+--------+--------+----+ Upper Pole  36      16      0.57Upper Pole 68      30      0.56 +------------+--------+--------+----+-----------+--------+--------+----+ Mid         36      16      0.57Mid        51      20      0.60 +------------+--------+--------+----+-----------+--------+--------+----+ Lower Pole  34      16      0.54Lower Pole 75      29      0.61 +------------+--------+--------+----+-----------+--------+--------+----+ Hilar       38      16      0.58Hilar      60      22      0.63 +------------+--------+--------+----+-----------+--------+--------+----+ +------------------+-----+------------------+-----+ Right Kidney           Left Kidney             +------------------+-----+------------------+-----+ RAR                    RAR                     +------------------+-----+------------------+-----+ RAR (manual)      0.92 RAR (manual)      0.64  +------------------+-----+------------------+-----+ Cortex                 Cortex                  +------------------+-----+------------------+-----+ Cortex thickness       Corex thickness         +------------------+-----+------------------+-----+ Kidney length (cm)12.00Kidney length (cm)11.20 +------------------+-----+------------------+-----+   Summary: Renal:  Right: Normal size right kidney. No evidence of right renal artery        stenosis. Normal right Resisitive Index. RRV flow present. Left:  Normal size of left kidney. No evidence of left renal artery        stenosis. Normal left Resistive Index. LRV flow present. Mesenteric: Normal Celiac artery  and Superior Mesenteric artery findings.  *See table(s) above for measurements and observations.     Preliminary       Norie Latendresse T. [place]  If 7PM-7AM, please contact night-coverage [email or web address] Password [facility] 05/21/2019, 10:57 AM

## 2019-05-21 NOTE — Progress Notes (Signed)
Renal artery duplex exam completed.  Preliminary results can be found under CV proc under chart review.  05/21/2019 9:37 AM  Kenlynn Houde, K., RDMS, RVT

## 2019-05-22 DIAGNOSIS — F419 Anxiety disorder, unspecified: Secondary | ICD-10-CM

## 2019-05-22 LAB — CBC
HCT: 42.5 % (ref 39.0–52.0)
Hemoglobin: 14.2 g/dL (ref 13.0–17.0)
MCH: 27.5 pg (ref 26.0–34.0)
MCHC: 33.4 g/dL (ref 30.0–36.0)
MCV: 82.2 fL (ref 80.0–100.0)
Platelets: 607 10*3/uL — ABNORMAL HIGH (ref 150–400)
RBC: 5.17 MIL/uL (ref 4.22–5.81)
RDW: 16 % — ABNORMAL HIGH (ref 11.5–15.5)
WBC: 8.4 10*3/uL (ref 4.0–10.5)
nRBC: 0 % (ref 0.0–0.2)

## 2019-05-22 LAB — RENAL FUNCTION PANEL
Albumin: 3.7 g/dL (ref 3.5–5.0)
Anion gap: 12 (ref 5–15)
BUN: 20 mg/dL (ref 6–20)
CO2: 24 mmol/L (ref 22–32)
Calcium: 9.8 mg/dL (ref 8.9–10.3)
Chloride: 102 mmol/L (ref 98–111)
Creatinine, Ser: 1.97 mg/dL — ABNORMAL HIGH (ref 0.61–1.24)
GFR calc Af Amer: 48 mL/min — ABNORMAL LOW (ref >60)
GFR calc non Af Amer: 42 mL/min — ABNORMAL LOW (ref >60)
Glucose, Bld: 117 mg/dL — ABNORMAL HIGH (ref 70–99)
Phosphorus: 3.6 mg/dL (ref 2.5–4.6)
Potassium: 3.4 mmol/L — ABNORMAL LOW (ref 3.5–5.1)
Sodium: 138 mmol/L (ref 135–145)

## 2019-05-22 LAB — MAGNESIUM: Magnesium: 2.3 mg/dL (ref 1.7–2.4)

## 2019-05-22 MED ORDER — CLONAZEPAM 0.5 MG PO TABS
0.5000 mg | ORAL_TABLET | Freq: Two times a day (BID) | ORAL | Status: DC
  Administered 2019-05-22 – 2019-05-23 (×3): 0.5 mg via ORAL
  Filled 2019-05-22 (×3): qty 1

## 2019-05-22 MED ORDER — BUSPIRONE HCL 10 MG PO TABS
5.0000 mg | ORAL_TABLET | Freq: Three times a day (TID) | ORAL | Status: DC
  Administered 2019-05-22 – 2019-05-24 (×7): 5 mg via ORAL
  Filled 2019-05-22 (×7): qty 1

## 2019-05-22 MED ORDER — POTASSIUM CHLORIDE CRYS ER 20 MEQ PO TBCR
40.0000 meq | EXTENDED_RELEASE_TABLET | ORAL | Status: AC
  Administered 2019-05-22 (×2): 40 meq via ORAL
  Filled 2019-05-22 (×2): qty 2

## 2019-05-22 NOTE — Progress Notes (Signed)
PROGRESS NOTE  Russell Hernandez [id number] DOB: [birth year]   PCP: Patient, No Pcp Per  Patient is from: Home.  DOA: 05/19/2019 LOS: 2  Brief Narrative / Interim history: 39 year old male with history of HTN, tobacco use and marijuana use presenting with right-sided numbness that lasted about 2 minutes.  No other associated symptoms.  Has been compliant with his amlodipine and chlorthalidone.  In ED, SBP in the 200s.  DBP in 130s. Na 133.  K2.6. Cr 1.72.  BUN 19.  CBC without significant finding.  UA with a small Hgb, 100 protein and few bacteria.  UDS positive for THC.  COVID-19 negative.  CT head without acute finding.  EKG with LVH and mild SE in anterior leads but doesn't meet criteria for STEMI.  He was admitted for TIA work-up and hypertensive urgency.  MRI brain without acute finding.  Echo with EF of 45 to 50%, global hypokinesis, moderate LVH and G1DD.  Cardiology consulted and made adjustments to blood pressure medications.  Renal Doppler without significant finding.  Blood pressure remains elevated despite multiple antihypertensive medications  Subjective: Rapid response this morning due to "right-sided weakness".  Reportedly no complaint when RR staff arrived.  He reports generalized weakness but denies focal weakness.  He endorses intermittent numbness in his tongue on the right.  Denies headache, chest pain, dyspnea, focal weakness or numbness at this time.  Blood pressure remains elevated, 172/103 on the monitor.  He says he gets anxious especially when he has his blood pressure checked.  Objective: Vitals:   05/21/19 2343 05/22/19 0336 05/22/19 0700 05/22/19 0805  BP: (!) 162/95 (!) 172/104 (!) 190/111 (!) 172/103  Pulse: 88 92 87   Resp: 19 (!) 22  20  Temp: 98.1 F (36.7 C) 98.8 F (37.1 C) 98.5 F (36.9 C)   TempSrc: Oral Oral Oral   SpO2: 98% 98%    Weight:  88 kg    Height:        Intake/Output Summary (Last 24 hours) at 05/22/2019 1012 Last data filed  at 05/22/2019 0700 Gross per 24 hour  Intake --  Output 850 ml  Net -850 ml   Filed Weights   05/20/19 0100 05/21/19 0356 05/22/19 0336  Weight: 99.3 kg 88.9 kg 88 kg    Examination:  GENERAL: No acute distress.  Appears well.  HEENT: MMM.  Vision and hearing grossly intact.  NECK: Supple.  No apparent JVD.  RESP:  No IWOB. Good air movement bilaterally. CVS:  RRR. Heart sounds normal.  ABD/GI/GU: Bowel sounds present. Soft. Non tender.  MSK/EXT:  Moves extremities. No apparent deformity. No edema.  SKIN: no apparent skin lesion or wound NEURO: Awake, alert and oriented appropriately.  CN II-XII intact.  Motor 5/5 in all muscle groups.  Light sensation intact in all dermatomes.  Patellar reflex symmetric.  Finger-to-nose and rapid alternating movements intact.  No pronator drift. PSYCH: Calm.  Somewhat anxious.  Procedures:  None  Assessment & Plan: New combined CHF: Echo with EF of 45 to 50%, global hypokinesis, moderate LVH and G1DD.  Likely due to uncontrolled hypertension.  No cardiopulmonary symptoms.  BNP 99.6.  Blood pressure remains elevated. -Cardiology consulted-making medication adjustment to optimize blood pressure control -Monitor fluid status, renal function and electrolytes  Hypertensive urgency: BP remains elevated.  UA with small Hgb and 100 protein.  TSH within normal.  Renal duplex without significant finding.  Echocardiogram as above.  No cardiopulmonary symptoms.  He might have some element of  whitecoat hypertension. -May benefit from sleep study-reports snoring and daytime fatigue -Amlodipine 10 mg daily, Aldactone 25 mg, hydralazine 75 mg 3 times daily and chlorthalidone 25 mg daily -Added clonidine 0.1 mg twice daily yesterday.  Increase carvedilol to 12.5 mg twice daily. -Added Klonopin and BuSpar for anxiety -Follow renin and aldosterone levels  Transient right paresthesia/TIA-likely due to uncontrolled hypertension.  Neuro exam reassuring.  MRI brain  without acute finding. -Optimize blood pressure control -LDL 232-started statin.  A1c 6.2%.  Tobacco use disorder -Encouraged cessation. -Nicotine patch  Marijuana use- -counseled.  Elevated creatinine-suspect progressive CKD versus AKI.  BUN within normal.  Baseline Cr ~1.4> 1.72 (admit)> 1.73> 1.97.  Renal duplex without significant finding. -Continue monitoring -We may have to hold diuretics if no improvement.  Prediabetes -Encourage lifestyle change  Hyperlipidemia: LDL 232.  HDL 31. -Started atorvastatin.  Hypokalemia: Likely due to chlorthalidone. -Replenished and recheck -Aldactone added.  Thrombocytosis: Unclear etiology.  Hgb within normal range.  Normal platelet in 2018. -May benefit from outpatient hematology follow-up if no improvement  Anxiety: Might be interfering with blood pressure reading. -Klonopin and BuSpar               DVT prophylaxis: Subcu heparin Code Status: Full code Family Communication: Updated patient's wife at bedside on 3/8.  Discharge barrier: New CHF, markedly elevated blood pressure and AKI.  Needs close monitoring and adjustment of his antihypertensive medications.  Very high risk for adverse outcome.  Patient is from: Home Final disposition: Likely home once medically stable and cleared by cardiology  Consultants: Cardiology   Microbiology summarized: COVID-19 negative MRSA PCR negative  Sch Meds:  Scheduled Meds: . amLODipine  10 mg Oral Daily  . aspirin EC  81 mg Oral Daily  . atorvastatin  20 mg Oral q1800  . busPIRone  5 mg Oral TID  . carvedilol  12.5 mg Oral BID WC  . chlorthalidone  25 mg Oral Daily  . clonazePAM  0.5 mg Oral BID  . cloNIDine  0.1 mg Oral BID  . heparin  5,000 Units Subcutaneous Q8H  . hydrALAZINE  75 mg Oral Q8H  . nicotine  21 mg Transdermal Daily  . potassium chloride  40 mEq Oral Q4H  . ramelteon  8 mg Oral QHS  . sodium chloride flush  3 mL Intravenous Q12H  . spironolactone  25 mg  Oral Daily   Continuous Infusions: . sodium chloride 250 mL (05/20/19 0702)   PRN Meds:.sodium chloride, acetaminophen, albuterol, alum & mag hydroxide-simeth, labetalol, sodium chloride flush, traMADol, traZODone  Antimicrobials: Anti-infectives (From admission, onward)   None       I have personally reviewed the following labs and images: CBC: Recent Labs  Lab 05/19/19 1652 05/20/19 0627 05/21/19 0142 05/22/19 0231  WBC 9.8 8.1 8.7 8.4  NEUTROABS 6.0  --   --   --   HGB 14.0 13.6 14.2 14.2  HCT 41.5 40.8 42.7 42.5  MCV 83.2 81.3 82.4 82.2  PLT 564* 557* 605* 607*   BMP &GFR Recent Labs  Lab 05/19/19 1652 05/19/19 2137 05/20/19 0157 05/20/19 0627 05/21/19 0142 05/22/19 0231  NA 133* 134*  --  138 139 138  K 2.6* 2.6*  --  3.2* 3.5 3.4*  CL 92* 95*  --  99 102 102  CO2 29 29  --  28 23 24   GLUCOSE 108* 114*  --  124* 129* 117*  BUN 19 17  --  12 14 20   CREATININE 1.72*  1.76*  --  1.72* 1.73* 1.97*  CALCIUM 9.5 9.1  --  9.1 9.7 9.8  MG  --   --  2.2  --  2.2 2.3  PHOS  --   --   --   --  3.4 3.6   Estimated Creatinine Clearance: 56.9 mL/min (A) (by C-G formula based on SCr of 1.97 mg/dL (H)). Liver & Pancreas: Recent Labs  Lab 05/21/19 0142 05/22/19 0231  ALBUMIN 3.7 3.7   No results for input(s): LIPASE, AMYLASE in the last 168 hours. No results for input(s): AMMONIA in the last 168 hours. Diabetic: Recent Labs    05/20/19 0627  HGBA1C 6.2*   Recent Labs  Lab 05/19/19 1610  GLUCAP 121*   Cardiac Enzymes: No results for input(s): CKTOTAL, CKMB, CKMBINDEX, TROPONINI in the last 168 hours. No results for input(s): PROBNP in the last 8760 hours. Coagulation Profile: Recent Labs  Lab 05/19/19 1652  INR 0.9   Thyroid Function Tests: Recent Labs    05/20/19 2139  TSH 1.894   Lipid Profile: Recent Labs    05/20/19 0627  CHOL 298*  HDL 31*  LDLCALC 232*  TRIG 173*  CHOLHDL 9.6   Anemia Panel: No results for input(s): VITAMINB12,  FOLATE, FERRITIN, TIBC, IRON, RETICCTPCT in the last 72 hours. Urine analysis:    Component Value Date/Time   COLORURINE YELLOW 05/19/2019 1642   APPEARANCEUR CLEAR 05/19/2019 1642   LABSPEC 1.020 05/19/2019 1642   PHURINE 6.0 05/19/2019 1642   GLUCOSEU NEGATIVE 05/19/2019 1642   HGBUR SMALL (A) 05/19/2019 1642   BILIRUBINUR NEGATIVE 05/19/2019 1642   KETONESUR NEGATIVE 05/19/2019 1642   PROTEINUR 100 (A) 05/19/2019 1642   NITRITE NEGATIVE 05/19/2019 1642   LEUKOCYTESUR NEGATIVE 05/19/2019 1642   Sepsis Labs: Invalid input(s): PROCALCITONIN, [place]  Microbiology: Recent Results (from the past 240 hour(s))  SARS CORONAVIRUS 2 (TAT 6-24 HRS) Nasopharyngeal Nasopharyngeal Swab     Status: None   Collection Time: 05/19/19 10:42 PM   Specimen: Nasopharyngeal Swab  Result Value Ref Range Status   SARS Coronavirus 2 NEGATIVE NEGATIVE Final    Comment: (NOTE) SARS-CoV-2 target nucleic acids are NOT DETECTED. The SARS-CoV-2 RNA is generally detectable in upper and lower respiratory specimens during the acute phase of infection. Negative results do not preclude SARS-CoV-2 infection, do not rule out co-infections with other pathogens, and should not be used as the sole basis for treatment or other patient management decisions. Negative results must be combined with clinical observations, patient history, and epidemiological information. The expected result is Negative. Fact Sheet for Patients: SugarRoll.be Fact Sheet for Healthcare Providers: https://www.woods-mathews.com/ This test is not yet approved or cleared by the [place] FDA and  has been authorized for detection and/or diagnosis of SARS-CoV-2 by FDA under an Emergency Use Authorization (EUA). This EUA will remain  in effect (meaning this test can be used) for the duration of the COVID-19 declaration under Section 56 4(b)(1) of the Act, 21 U.S.C. section 360bbb-3(b)(1),  unless the authorization is terminated or revoked sooner. Performed at [facility], [place] [address], [place], [place] [address]   MRSA PCR Screening     Status: None   Collection Time: 05/20/19  1:08 AM   Specimen: Nasal Mucosa; Nasopharyngeal  Result Value Ref Range Status   MRSA by PCR NEGATIVE NEGATIVE Final    Comment:        The GeneXpert MRSA Assay (FDA approved for NASAL specimens only), is one component of a comprehensive  MRSA colonization surveillance program. It is not intended to diagnose MRSA infection nor to guide or monitor treatment for MRSA infections. Performed at [facility], [place] [address], [place], [place] [address]     Radiology Studies: No results found.    Versa Craton T. Weiner  If 7PM-7AM, please contact night-coverage [email or web address] Password [email or web address] 05/22/2019, 10:12 AM

## 2019-05-22 NOTE — Significant Event (Signed)
Rapid Response Event Note  Overview: Time Called: [id number] Arrival Time: 0800 Event Type: Other (Comment)(Pt complains of right sided weakness.)  Initial Focused Assessment: Pt lying in bed, awake, alert, oriented. Speech is clear. EOMI, PERRLA. Face symmetrical. Pt follows commands. Equal sensation on all extremities. Pt able to move all extremities, no ataxia. Fine motor intake to left and right hands. No extinction or inattention. BP 172/103, HR 87. Pt has no complaints of headache or pain. Pt initially complains of right arm weakness/tingling, then states both arms are tingling, then he complains of his tongue tingling.   Interventions: -MD notified of pt's complaints, no focal deficients noted during assessment. Dr. Cyndia Skeeters at bedside to assess pt.  -No interventions from rapid response.  Plan of Care (if not transferred): -Establish BP control, SBP <160 -Neuro assessments    Event Summary: Name of Physician Notified: Dr. Cyndia Skeeters at [place] in room and stabalized  Event End Time: [place]

## 2019-05-22 NOTE — Progress Notes (Signed)
Progress Note  Patient Name: Russell Hernandez Date of Encounter: 05/22/2019  Primary Cardiologist: new  Subjective   No chest pain or shortness of breath  Inpatient Medications    Scheduled Meds: . amLODipine  10 mg Oral Daily  . aspirin EC  81 mg Oral Daily  . atorvastatin  20 mg Oral q1800  . busPIRone  5 mg Oral TID  . carvedilol  12.5 mg Oral BID WC  . chlorthalidone  25 mg Oral Daily  . clonazePAM  0.5 mg Oral BID  . cloNIDine  0.1 mg Oral BID  . heparin  5,000 Units Subcutaneous Q8H  . hydrALAZINE  75 mg Oral Q8H  . nicotine  21 mg Transdermal Daily  . ramelteon  8 mg Oral QHS  . sodium chloride flush  3 mL Intravenous Q12H  . spironolactone  25 mg Oral Daily   Continuous Infusions: . sodium chloride 250 mL (05/20/19 0702)   PRN Meds: sodium chloride, acetaminophen, albuterol, alum & mag hydroxide-simeth, labetalol, sodium chloride flush, traMADol, traZODone   Vital Signs    Vitals:   05/21/19 2343 05/22/19 0336 05/22/19 0700 05/22/19 0805  BP: (!) 162/95 (!) 172/104 (!) 190/111 (!) 172/103  Pulse: 88 92 87   Resp: 19 (!) 22  20  Temp: 98.1 F (36.7 C) 98.8 F (37.1 C) 98.5 F (36.9 C)   TempSrc: Oral Oral Oral   SpO2: 98% 98%    Weight:  88 kg    Height:        Intake/Output Summary (Last 24 hours) at 05/22/2019 1514 Last data filed at 05/22/2019 0700 Gross per 24 hour  Intake --  Output 850 ml  Net -850 ml    I/O since admission: -1000  Filed Weights   05/20/19 0100 05/21/19 0356 05/22/19 0336  Weight: 99.3 kg 88.9 kg 88 kg    Telemetry    Sinus rhythm rate improved now in the 70s- Personally Reviewed  ECG    ECG (independently read by me): Normal sinus rhythm at 80, left atrial enlargement, LVH with repolarization changes.  QTc interval 477 ms  Physical Exam   BP (!) 172/103   Pulse 87   Temp 98.5 F (36.9 C) (Oral)   Resp 20   Ht 6' 0.99" (1.854 m) Comment: 6'1  Wt 88 kg   SpO2 98%   BMI 25.60 kg/m  General: Alert,  oriented, no distress.  Skin: normal turgor, no rashes, warm and dry; multiple tattoos HEENT: Normocephalic, atraumatic. Pupils equal round and reactive to light; sclera anicteric; extraocular muscles intact;  Nose without nasal septal hypertrophy Mouth/Parynx benign; Mallinpatti scale 3 Neck: No JVD, no carotid bruits; normal carotid upstroke Lungs: clear to ausculatation and percussion; no wheezing or rales Chest wall: without tenderness to palpitation Heart: PMI not displaced, RRR, s1 s2 normal, 1/6 systolic murmur, no diastolic murmur, no rubs, gallops, thrills, or heaves Abdomen: soft, nontender; no hepatosplenomehaly, BS+; abdominal aorta nontender and not dilated by palpation. Back: no CVA tenderness Pulses 2+ Musculoskeletal: full range of motion, normal strength, no joint deformities Extremities: no clubbing cyanosis or edema, Homan's sign negative  Neurologic: grossly nonfocal; Cranial nerves grossly wnl Psychologic: Normal mood and affect   Labs    Chemistry Recent Labs  Lab 05/20/19 0627 05/21/19 0142 05/22/19 0231  NA 138 139 138  K 3.2* 3.5 3.4*  CL 99 102 102  CO2 28 23 24   GLUCOSE 124* 129* 117*  BUN 12 14 20   CREATININE 1.72*  1.73* 1.97*  CALCIUM 9.1 9.7 9.8  ALBUMIN  --  3.7 3.7  GFRNONAA 49* 49* 42*  GFRAA 57* 56* 48*  ANIONGAP 11 14 12      Hematology Recent Labs  Lab 05/20/19 0627 05/21/19 0142 05/22/19 0231  WBC 8.1 8.7 8.4  RBC 5.02 5.18 5.17  HGB 13.6 14.2 14.2  HCT 40.8 42.7 42.5  MCV 81.3 82.4 82.2  MCH 27.1 27.4 27.5  MCHC 33.3 33.3 33.4  RDW 15.8* 16.0* 16.0*  PLT 557* 605* 607*    Cardiac EnzymesNo results for input(s): TROPONINI in the last 168 hours. No results for input(s): TROPIPOC in the last 168 hours.   BNP Recent Labs  Lab 05/20/19 1808  BNP 99.6     DDimer No results for input(s): DDIMER in the last 168 hours.   Lipid Panel     Component Value Date/Time   CHOL 298 (H) 05/20/2019 0627   TRIG 173 (H) 05/20/2019  0627   HDL 31 (L) 05/20/2019 0627   CHOLHDL 9.6 05/20/2019 0627   VLDL 35 05/20/2019 0627   LDLCALC 232 (H) 05/20/2019 0627     Radiology    ECHOCARDIOGRAM COMPLETE  Result Date: 05/20/2019    ECHOCARDIOGRAM REPORT   Patient Name:   Russell Hernandez Date of Exam: 05/20/2019 Medical Rec #:  [id number]           Height:       73.0 in Accession #:    [id number]          Weight:       218.9 lb Date of Birth:  21-Jun-1980          BSA:          2.236 m Patient Age:    53 years            BP:           188/132 mmHg Patient Gender: M                   HR:           77 bpm. Exam Location:  Inpatient Procedure: 2D Echo Indications:    TIA  History:        Patient has no prior history of Echocardiogram examinations.                 Risk Factors:Hypertension.  Sonographer:    Mikki Santee RDCS (AE) Referring Phys: [id number] Charlesetta Ivory GONFA IMPRESSIONS  1. Mild global reduction in LV systolic function; moderate LVH; grade 1 diastolic dysfunction.  2. Left ventricular ejection fraction, by estimation, is 45 to 50%. The left ventricle has mildly decreased function. The left ventricle demonstrates global hypokinesis. There is moderate left ventricular hypertrophy. Left ventricular diastolic parameters are consistent with Grade I diastolic dysfunction (impaired relaxation).  3. Right ventricular systolic function is normal. The right ventricular size is normal.  4. The mitral valve is normal in structure. Trivial mitral valve regurgitation. No evidence of mitral stenosis.  5. The aortic valve is normal in structure. Aortic valve regurgitation is not visualized. No aortic stenosis is present.  6. The inferior vena cava is normal in size with greater than 50% respiratory variability, suggesting right atrial pressure of 3 mmHg. FINDINGS  Left Ventricle: Left ventricular ejection fraction, by estimation, is 45 to 50%. The left ventricle has mildly decreased function. The left ventricle demonstrates global hypokinesis. The  left ventricular internal cavity size was normal in size. There is  moderate left ventricular hypertrophy. Left ventricular diastolic parameters are consistent with Grade I diastolic dysfunction (impaired relaxation). Right Ventricle: The right ventricular size is normal.Right ventricular systolic function is normal. Left Atrium: Left atrial size was normal in size. Right Atrium: Right atrial size was normal in size. Pericardium: Trivial pericardial effusion is present. Mitral Valve: The mitral valve is normal in structure. Normal mobility of the mitral valve leaflets. Trivial mitral valve regurgitation. No evidence of mitral valve stenosis. Tricuspid Valve: The tricuspid valve is normal in structure. Tricuspid valve regurgitation is trivial. No evidence of tricuspid stenosis. Aortic Valve: The aortic valve is normal in structure. Aortic valve regurgitation is not visualized. No aortic stenosis is present. Pulmonic Valve: The pulmonic valve was normal in structure. Pulmonic valve regurgitation is not visualized. No evidence of pulmonic stenosis. Aorta: The aortic root is normal in size and structure. Venous: The inferior vena cava is normal in size with greater than 50% respiratory variability, suggesting right atrial pressure of 3 mmHg. IAS/Shunts: No atrial level shunt detected by color flow Doppler. Additional Comments: Mild global reduction in LV systolic function; moderate LVH; grade 1 diastolic dysfunction.  LEFT VENTRICLE PLAX 2D LVIDd:         5.00 cm  Diastology LVIDs:         3.60 cm  LV e' lateral:   6.48 cm/s LV PW:         1.40 cm  LV E/e' lateral: 9.4 LV IVS:        1.30 cm  LV e' medial:    4.85 cm/s LVOT diam:     2.50 cm  LV E/e' medial:  12.6 LV SV:         103 LV SV Index:   46 LVOT Area:     4.91 cm  RIGHT VENTRICLE             IVC RV Basal diam:  3.20 cm     IVC diam: 2.10 cm RV Mid diam:    3.70 cm RV S prime:     16.20 cm/s TAPSE (M-mode): 2.6 cm LEFT ATRIUM             Index       RIGHT  ATRIUM           Index LA diam:        4.50 cm 2.01 cm/m  RA Area:     16.00 cm LA Vol (A2C):   37.4 ml 16.73 ml/m RA Volume:   36.00 ml  16.10 ml/m LA Vol (A4C):   49.2 ml 22.00 ml/m LA Biplane Vol: 45.9 ml 20.53 ml/m  AORTIC VALVE LVOT Vmax:   124.00 cm/s LVOT Vmean:  79.100 cm/s LVOT VTI:    0.210 m  AORTA Ao Root diam: 3.60 cm MITRAL VALVE MV Area (PHT): 3.83 cm    SHUNTS MV Decel Time: 198 msec    Systemic VTI:  0.21 m MV E velocity: 60.90 cm/s  Systemic Diam: 2.50 cm MV A velocity: 69.00 cm/s MV E/A ratio:  0.88 Kirk Ruths MD Electronically signed by Kirk Ruths MD Signature Date/Time: 05/20/2019/3:29:32 PM    Final    VAS US RENAL ARTERY DUPLEX  Result Date: 05/21/2019 ABDOMINAL VISCERAL Indications: Hypertension Comparison Study: No prior exam. Performing Technologist: Baldwin Crown RDMS, RVT  Examination Guidelines: A complete evaluation includes B-mode imaging, spectral Doppler, color Doppler, and power Doppler as needed of all accessible portions of each vessel. Bilateral testing is considered an integral part of  a complete examination. Limited examinations for reoccurring indications may be performed as noted.  Duplex Findings: +--------------------+--------+--------+------+--------+ Mesenteric          PSV cm/sEDV cm/sPlaqueComments +--------------------+--------+--------+------+--------+ Aorta Prox            107      19                  +--------------------+--------+--------+------+--------+ Celiac Artery Origin  146      48                  +--------------------+--------+--------+------+--------+ SMA Proximal          157      23                  +--------------------+--------+--------+------+--------+    +------------------+--------+--------+-------+ Right Renal ArteryPSV cm/sEDV cm/sComment +------------------+--------+--------+-------+ Origin               65      23           +------------------+--------+--------+-------+ Proximal              83      29           +------------------+--------+--------+-------+ Mid                  99      38           +------------------+--------+--------+-------+ Distal               46      20           +------------------+--------+--------+-------+ +-----------------+--------+--------+-------+ Left Renal ArteryPSV cm/sEDV cm/sComment +-----------------+--------+--------+-------+ Origin              52      16           +-----------------+--------+--------+-------+ Proximal            68      17           +-----------------+--------+--------+-------+ Mid                 59      19           +-----------------+--------+--------+-------+ Distal              66      23           +-----------------+--------+--------+-------+ +------------+--------+--------+----+-----------+--------+--------+----+ Right KidneyPSV cm/sEDV cm/sRI  Left KidneyPSV cm/sEDV cm/sRI   +------------+--------+--------+----+-----------+--------+--------+----+ Upper Pole  36      16      0.57Upper Pole 68      30      0.56 +------------+--------+--------+----+-----------+--------+--------+----+ Mid         36      16      0.57Mid        51      20      0.60 +------------+--------+--------+----+-----------+--------+--------+----+ Lower Pole  34      16      0.54Lower Pole 75      29      0.61 +------------+--------+--------+----+-----------+--------+--------+----+ Hilar       38      16      0.58Hilar      60      22      0.63 +------------+--------+--------+----+-----------+--------+--------+----+ +------------------+-----+------------------+-----+ Right Kidney           Left Kidney             +------------------+-----+------------------+-----+ RAR  RAR                     +------------------+-----+------------------+-----+ RAR (manual)      0.92 RAR (manual)      0.64  +------------------+-----+------------------+-----+ Cortex                  Cortex                  +------------------+-----+------------------+-----+ Cortex thickness       Corex thickness         +------------------+-----+------------------+-----+ Kidney length (cm)12.00Kidney length (cm)11.20 +------------------+-----+------------------+-----+  Summary: Renal:  Right: Normal size right kidney. No evidence of right renal artery        stenosis. Normal right Resisitive Index. RRV flow present. Left:  Normal size of left kidney. No evidence of left renal artery        stenosis. Normal left Resistive Index. LRV flow present. Mesenteric: Normal Celiac artery and Superior Mesenteric artery findings.  *See table(s) above for measurements and observations.  Diagnosing physician: Harold Barban MD  Electronically signed by Harold Barban MD on 05/21/2019 at 5:53:40 PM.    Final     Cardiac Studies   Echo 05/20/2019 IMPRESSIONS  1. Mild global reduction in LV systolic function; moderate LVH; grade 1  diastolic dysfunction.  2. Left ventricular ejection fraction, by estimation, is 45 to 50%. The  left ventricle has mildly decreased function. The left ventricle  demonstrates global hypokinesis. There is moderate left ventricular  hypertrophy. Left ventricular diastolic  parameters are consistent with Grade I diastolic dysfunction (impaired  relaxation).  3. Right ventricular systolic function is normal. The right ventricular  size is normal.  4. The mitral valve is normal in structure. Trivial mitral valve  regurgitation. No evidence of mitral stenosis.  5. The aortic valve is normal in structure. Aortic valve regurgitation is  not visualized. No aortic stenosis is present.  6. The inferior vena cava is normal in size with greater than 50%  respiratory variability, suggesting right atrial pressure of 3 mmHg.   Patient Profile     Russell Hernandez is a 39 y.o. male with a hx of HTN, tobacco abuse and daily marijuana use who was seen for the evaluation  of cardiomyopathy at the request of Dr. Cyndia Skeeters.  Assessment & Plan    1.  Hypertensive cardiomyopathy with moderate LVH, EF 45 to 50%.  Suspect secondary to longstanding uncontrolled hypertension.  2.  Accelerated hypertension: BP continues to be elevated; earlier 172/103.  Medications have been titrated and now include amlodipine 10 mg, spironolactone 25 mg daily, chlorthalidone 25 mg, hydralazine 75 mg every 8 hours in addition to carvedilol increased to 12.5 mg twice a day yesterday.  Heart rate today improved in the 70s.  Apparently clonidine may also have been started.  Renal duplex imaging did not reveal any renal vascular etiology without evidence for renal artery stenosis.  3.  Transient right-sided numbness: Resolved; no CVA CT and MRI  4.  Marked hyperlipidemia: LDL 232, total cholesterol 298, with significant family history of high cholesterol high likelihood for familial hyperlipidemia.  Now on atorvastatin 80 mg.  5.  Stage III chronic kidney disease: Creatinine 1.73 increased today to 1.97.  Suspect that this may be increased due to lowering of the renal perfusion pressure.  Will need to monitor closely.  6.  Hypokalemia: Improving with potassium supplementation.  With severe accelerated hypertension, consider evaluation for hyperaldosteronism.  7.  Tobacco and marijuana  use: Strongly advised discontinuance.   Signed, Troy Sine, MD, [facility] 05/22/2019, 3:14 PM

## 2019-05-23 LAB — CBC
HCT: 41.1 % (ref 39.0–52.0)
Hemoglobin: 13.7 g/dL (ref 13.0–17.0)
MCH: 27.7 pg (ref 26.0–34.0)
MCHC: 33.3 g/dL (ref 30.0–36.0)
MCV: 83.2 fL (ref 80.0–100.0)
Platelets: 566 10*3/uL — ABNORMAL HIGH (ref 150–400)
RBC: 4.94 MIL/uL (ref 4.22–5.81)
RDW: 16.4 % — ABNORMAL HIGH (ref 11.5–15.5)
WBC: 7.7 10*3/uL (ref 4.0–10.5)
nRBC: 0 % (ref 0.0–0.2)

## 2019-05-23 LAB — RENAL FUNCTION PANEL
Albumin: 3.6 g/dL (ref 3.5–5.0)
Anion gap: 12 (ref 5–15)
BUN: 24 mg/dL — ABNORMAL HIGH (ref 6–20)
CO2: 23 mmol/L (ref 22–32)
Calcium: 9.6 mg/dL (ref 8.9–10.3)
Chloride: 101 mmol/L (ref 98–111)
Creatinine, Ser: 2.18 mg/dL — ABNORMAL HIGH (ref 0.61–1.24)
GFR calc Af Amer: 43 mL/min — ABNORMAL LOW (ref >60)
GFR calc non Af Amer: 37 mL/min — ABNORMAL LOW (ref >60)
Glucose, Bld: 116 mg/dL — ABNORMAL HIGH (ref 70–99)
Phosphorus: 4 mg/dL (ref 2.5–4.6)
Potassium: 4 mmol/L (ref 3.5–5.1)
Sodium: 136 mmol/L (ref 135–145)

## 2019-05-23 LAB — MAGNESIUM: Magnesium: 2.2 mg/dL (ref 1.7–2.4)

## 2019-05-23 MED ORDER — CARVEDILOL 25 MG PO TABS
25.0000 mg | ORAL_TABLET | Freq: Two times a day (BID) | ORAL | Status: DC
  Administered 2019-05-24 – 2019-05-25 (×3): 25 mg via ORAL
  Filled 2019-05-23 (×3): qty 1

## 2019-05-23 MED ORDER — SPIRONOLACTONE 12.5 MG HALF TABLET
12.5000 mg | ORAL_TABLET | Freq: Every day | ORAL | Status: DC
  Administered 2019-05-23: 12.5 mg via ORAL
  Filled 2019-05-23: qty 1

## 2019-05-23 MED ORDER — CLONIDINE HCL 0.1 MG PO TABS
0.2000 mg | ORAL_TABLET | Freq: Two times a day (BID) | ORAL | Status: DC
  Administered 2019-05-23 – 2019-05-25 (×4): 0.2 mg via ORAL
  Filled 2019-05-23 (×4): qty 2

## 2019-05-23 MED ORDER — TRAMADOL HCL 50 MG PO TABS: 50.0000 mg | ORAL_TABLET | Freq: Three times a day (TID) | ORAL | Status: DC | PRN

## 2019-05-23 NOTE — Progress Notes (Signed)
PROGRESS NOTE  Russell Hernandez [id number] DOB: 03-31-1980   PCP: Patient, No Pcp Per  Patient is from: Home.  DOA: 05/19/2019 LOS: 3  Brief Narrative / Interim history: 39 year old male with history of HTN, tobacco use and marijuana use presenting with right-sided numbness that lasted about 2 minutes.  No other associated symptoms.  Has been compliant with his amlodipine and chlorthalidone.  In ED, SBP in the 200s.  DBP in 130s. Na 133.  K2.6. Cr 1.72.  BUN 19.  CBC without significant finding.  UA with a small Hgb, 100 protein and few bacteria.  UDS positive for THC.  COVID-19 negative.  CT head without acute finding.  EKG with LVH and mild SE in anterior leads but doesn't meet criteria for STEMI.  He was admitted for TIA work-up and hypertensive urgency.  MRI brain without acute finding.  Echo with EF of 45 to 50%, global hypokinesis, moderate LVH and G1DD.  Cardiology consulted and made adjustments to blood pressure medications.  Renal Doppler without significant finding.  Blood pressure remains elevated despite multiple antihypertensive medications  Subjective: No major events overnight or this morning.  Blood pressure remains elevated but improved.  Renal function worse.  He is frustrated about prolonged hospital stay.  Denies headache, vision change, chest pain, shortness of breath or focal neuro symptoms.  Objective: Vitals:   05/23/19 0644 05/23/19 0748 05/23/19 0800 05/23/19 0914  BP: (!) 162/105   (!) 160/90  Pulse:   78   Resp:      Temp:  98.5 F (36.9 C)    TempSrc:  Oral    SpO2:   98%   Weight:      Height:        Intake/Output Summary (Last 24 hours) at 05/23/2019 1045 Last data filed at 05/23/2019 1018 Gross per 24 hour  Intake 130 ml  Output 150 ml  Net -20 ml   Filed Weights   05/21/19 0356 05/22/19 0336 05/23/19 0349  Weight: 88.9 kg 88 kg 88.6 kg    Examination:  GENERAL: No acute distress.  Appears well.  HEENT: MMM.  Vision and hearing  grossly intact.  NECK: Supple.  No apparent JVD.  RESP:  No IWOB. Good air movement bilaterally. CVS:  RRR. Heart sounds normal.  ABD/GI/GU: Bowel sounds present. Soft. Non tender.  MSK/EXT:  Moves extremities. No apparent deformity. No edema.  SKIN: no apparent skin lesion or wound NEURO: Awake, alert and oriented appropriately.  No apparent focal neuro deficit. PSYCH: Calm.  Upset about prolonged hospitalization.  Procedures:  None  Assessment & Plan: New combined CHF: Echo with EF of 45 to 50%, global hypokinesis, moderate LVH and G1DD.  Likely due to uncontrolled hypertension.  No cardiopulmonary symptoms.  BNP 99.6.  Blood pressure remains elevated. -Cardiology consulted-making medication adjustment to optimize blood pressure control -Monitor fluid status, renal function and electrolytes  Hypertensive urgency: BP remains elevated but slowly improving.  UA with small Hgb and 100 protein.  TSH within normal.  Renal duplex without significant finding.  Echocardiogram as above.  No cardiopulmonary symptoms.  He might have some element of whitecoat hypertension and OSA. -May benefit from sleep study-reports snoring and daytime fatigue -Amlodipine 10 mg daily,   -Hydralazine 75 mg 3 times daily -Carvedilol 12.5 mg twice daily -Clonidine 0.1 mg twice daily yesterday. -Continue as needed labetalol -Discontinue chlorthalidone and decrease Aldactone in the setting of AKI -Continue Klonopin and BuSpar for anxiety -Follow renin and aldosterone levels  AKI  on CKD-3A-suspect progressive CKD versus AKI.  BUN within normal.  Baseline Cr ~1.4> 1.72 (admit)> 1.73> 1.97> 2.18.  Renal duplex without significant finding.  This could be due to hypertension and/or diuretics. -Discontinue chlorthalidone.  Decrease Aldactone -Continue monitoring  Transient right paresthesia/TIA-likely due to uncontrolled hypertension.  Neuro exam reassuring.  MRI brain without acute finding. -Optimize blood pressure  control -LDL 232-started statin.  A1c 6.2%.  Tobacco use disorder -Encouraged cessation. -Nicotine patch  Marijuana use- -counseled.  Prediabetes -Encourage lifestyle change  Hyperlipidemia: LDL 232.  HDL 31. -Started atorvastatin.  Hypokalemia: Likely due to chlorthalidone. -Replenished and recheck -Aldactone added.  Thrombocytosis: Unclear etiology.  Hgb within normal range.  Normal platelet in 2018.  Improving. -Continue monitoring -May benefit from outpatient hematology follow-up if no improvement  Anxiety: Might be interfering with blood pressure reading. -Continue Klonopin and BuSpar               DVT prophylaxis: Subcu heparin Code Status: Full code Family Communication: Updated patient's mother over the phone on 3/10.  Discharge barrier: New CHF, markedly elevated blood pressure and AKI.  Needs close monitoring and adjustment of his antihypertensive medications.  Very high risk for adverse outcome.  Patient is from: Home Final disposition: Likely home once medically stable and cleared by cardiology  Consultants: Cardiology   Microbiology summarized: COVID-19 negative MRSA PCR negative  Sch Meds:  Scheduled Meds: . amLODipine  10 mg Oral Daily  . aspirin EC  81 mg Oral Daily  . atorvastatin  20 mg Oral q1800  . busPIRone  5 mg Oral TID  . carvedilol  12.5 mg Oral BID WC  . clonazePAM  0.5 mg Oral BID  . cloNIDine  0.1 mg Oral BID  . heparin  5,000 Units Subcutaneous Q8H  . hydrALAZINE  75 mg Oral Q8H  . nicotine  21 mg Transdermal Daily  . ramelteon  8 mg Oral QHS  . sodium chloride flush  3 mL Intravenous Q12H  . spironolactone  12.5 mg Oral Daily   Continuous Infusions: . sodium chloride 250 mL (05/20/19 0702)   PRN Meds:.sodium chloride, acetaminophen, albuterol, alum & mag hydroxide-simeth, labetalol, sodium chloride flush, traMADol, traZODone  Antimicrobials: Anti-infectives (From admission, onward)   None       I have  personally reviewed the following labs and images: CBC: Recent Labs  Lab 05/19/19 1652 05/20/19 0627 05/21/19 0142 05/22/19 0231 05/23/19 0221  WBC 9.8 8.1 8.7 8.4 7.7  NEUTROABS 6.0  --   --   --   --   HGB 14.0 13.6 14.2 14.2 13.7  HCT 41.5 40.8 42.7 42.5 41.1  MCV 83.2 81.3 82.4 82.2 83.2  PLT 564* 557* 605* 607* 566*   BMP &GFR Recent Labs  Lab 05/19/19 2137 05/20/19 0157 05/20/19 0627 05/21/19 0142 05/22/19 0231 05/23/19 0221  NA 134*  --  138 139 138 136  K 2.6*  --  3.2* 3.5 3.4* 4.0  CL 95*  --  99 102 102 101  CO2 29  --  28 23 24 23   GLUCOSE 114*  --  124* 129* 117* 116*  BUN 17  --  12 14 20  24*  CREATININE 1.76*  --  1.72* 1.73* 1.97* 2.18*  CALCIUM 9.1  --  9.1 9.7 9.8 9.6  MG  --  2.2  --  2.2 2.3 2.2  PHOS  --   --   --  3.4 3.6 4.0   Estimated Creatinine Clearance: 51.4 mL/min (A) (  by C-G formula based on SCr of 2.18 mg/dL (H)). Liver & Pancreas: Recent Labs  Lab 05/21/19 0142 05/22/19 0231 05/23/19 0221  ALBUMIN 3.7 3.7 3.6   No results for input(s): LIPASE, AMYLASE in the last 168 hours. No results for input(s): AMMONIA in the last 168 hours. Diabetic: No results for input(s): HGBA1C in the last 72 hours. Recent Labs  Lab 05/19/19 1610  GLUCAP 121*   Cardiac Enzymes: No results for input(s): CKTOTAL, CKMB, CKMBINDEX, TROPONINI in the last 168 hours. No results for input(s): PROBNP in the last 8760 hours. Coagulation Profile: Recent Labs  Lab 05/19/19 1652  INR 0.9   Thyroid Function Tests: Recent Labs    05/20/19 2139  TSH 1.894   Lipid Profile: No results for input(s): CHOL, HDL, LDLCALC, TRIG, CHOLHDL, LDLDIRECT in the last 72 hours. Anemia Panel: No results for input(s): VITAMINB12, FOLATE, FERRITIN, TIBC, IRON, RETICCTPCT in the last 72 hours. Urine analysis:    Component Value Date/Time   COLORURINE YELLOW 05/19/2019 1642   APPEARANCEUR CLEAR 05/19/2019 1642   LABSPEC 1.020 05/19/2019 1642   PHURINE 6.0 05/19/2019  1642   GLUCOSEU NEGATIVE 05/19/2019 1642   HGBUR SMALL (A) 05/19/2019 1642   BILIRUBINUR NEGATIVE 05/19/2019 1642   KETONESUR NEGATIVE 05/19/2019 1642   PROTEINUR 100 (A) 05/19/2019 1642   NITRITE NEGATIVE 05/19/2019 1642   LEUKOCYTESUR NEGATIVE 05/19/2019 1642   Sepsis Labs: Invalid input(s): PROCALCITONIN, [place]  Microbiology: Recent Results (from the past 240 hour(s))  SARS CORONAVIRUS 2 (TAT 6-24 HRS) Nasopharyngeal Nasopharyngeal Swab     Status: None   Collection Time: 05/19/19 10:42 PM   Specimen: Nasopharyngeal Swab  Result Value Ref Range Status   SARS Coronavirus 2 NEGATIVE NEGATIVE Final    Comment: (NOTE) SARS-CoV-2 target nucleic acids are NOT DETECTED. The SARS-CoV-2 RNA is generally detectable in upper and lower respiratory specimens during the acute phase of infection. Negative results do not preclude SARS-CoV-2 infection, do not rule out co-infections with other pathogens, and should not be used as the sole basis for treatment or other patient management decisions. Negative results must be combined with clinical observations, patient history, and epidemiological information. The expected result is Negative. Fact Sheet for Patients: SugarRoll.be Fact Sheet for Healthcare Providers: https://www.woods-mathews.com/ This test is not yet approved or cleared by the [place] FDA and  has been authorized for detection and/or diagnosis of SARS-CoV-2 by FDA under an Emergency Use Authorization (EUA). This EUA will remain  in effect (meaning this test can be used) for the duration of the COVID-19 declaration under Section 56 4(b)(1) of the Act, 21 U.S.C. section 360bbb-3(b)(1), unless the authorization is terminated or revoked sooner. Performed at [facility], [place] [address] [place], [place] [address]   MRSA PCR Screening     Status: None   Collection Time: 05/20/19  1:08 AM   Specimen: Nasal Mucosa;  Nasopharyngeal  Result Value Ref Range Status   MRSA by PCR NEGATIVE NEGATIVE Final    Comment:        The GeneXpert MRSA Assay (FDA approved for NASAL specimens only), is one component of a comprehensive MRSA colonization surveillance program. It is not intended to diagnose MRSA infection nor to guide or monitor treatment for MRSA infections. Performed at [facility], [place] [address], [place], [place] [address]     Radiology Studies: No results found.    Taye T. [place]  If 7PM-7AM, please contact night-coverage [email or web address] Password [facility] 05/23/2019, 10:45 AM

## 2019-05-23 NOTE — Progress Notes (Signed)
Progress Note  Patient Name: Russell Hernandez Date of Encounter: 05/23/2019  Primary Cardiologist: new  Subjective   No chest pain or shortness of breath  Inpatient Medications    Scheduled Meds: . amLODipine  10 mg Oral Daily  . aspirin EC  81 mg Oral Daily  . atorvastatin  20 mg Oral q1800  . busPIRone  5 mg Oral TID  . carvedilol  12.5 mg Oral BID WC  . clonazePAM  0.5 mg Oral BID  . cloNIDine  0.1 mg Oral BID  . heparin  5,000 Units Subcutaneous Q8H  . hydrALAZINE  75 mg Oral Q8H  . nicotine  21 mg Transdermal Daily  . ramelteon  8 mg Oral QHS  . sodium chloride flush  3 mL Intravenous Q12H  . spironolactone  12.5 mg Oral Daily   Continuous Infusions: . sodium chloride 250 mL (05/20/19 0702)   PRN Meds: sodium chloride, acetaminophen, albuterol, alum & mag hydroxide-simeth, labetalol, sodium chloride flush, traMADol, traZODone   Vital Signs    Vitals:   05/23/19 0600 05/23/19 0644 05/23/19 0748 05/23/19 0800  BP: (!) 144/99 (!) 162/105    Pulse:    78  Resp: 16     Temp:   98.5 F (36.9 C)   TempSrc:   Oral   SpO2:    98%  Weight:      Height:        Intake/Output Summary (Last 24 hours) at 05/23/2019 0857 Last data filed at 05/22/2019 2148 Gross per 24 hour  Intake 10 ml  Output --  Net 10 ml    I/O since admission: -[place] Weights   05/21/19 0356 05/22/19 0336 05/23/19 0349  Weight: 88.9 kg 88 kg 88.6 kg    Telemetry    Sinus rhythm rate improved now in the 70s- Personally Reviewed  ECG    ECG (independently read by me): Normal sinus rhythm at 80, left atrial enlargement, LVH with repolarization changes.  QTc interval 477 ms  Physical Exam     Physical Exam BP (!) 162/105   Pulse 78   Temp 98.5 F (36.9 C) (Oral)   Resp 16   Ht 6' 0.99" (1.854 m) Comment: 6'1  Wt 88.6 kg   SpO2 98%   BMI 25.78 kg/m  General: Alert, oriented, no distress.  Skin: normal turgor, no rashes, warm and dry HEENT: Normocephalic,  atraumatic. Pupils equal round and reactive to light; sclera anicteric; extraocular muscles intact; Fundi ** Nose without nasal septal hypertrophy Mouth/Parynx benign; Mallinpatti scale Neck: No JVD, no carotid bruits; normal carotid upstroke Lungs: clear to ausculatation and percussion; no wheezing or rales Chest wall: without tenderness to palpitation Heart: PMI not displaced, RRR, s1 s2 normal, 1/6 systolic murmur, no diastolic murmur, no rubs, gallops, thrills, or heaves Abdomen: soft, nontender; no hepatosplenomehaly, BS+; abdominal aorta nontender and not dilated by palpation. Back: no CVA tenderness Pulses 2+ Musculoskeletal: full range of motion, normal strength, no joint deformities Extremities: no clubbing cyanosis or edema, Homan's sign negative  Neurologic: grossly nonfocal; Cranial nerves grossly wnl Psychologic: Normal mood and affect    BP (!) 162/105   Pulse 78   Temp 98.5 F (36.9 C) (Oral)   Resp 16   Ht 6' 0.99" (1.854 m) Comment: 6'1  Wt 88.6 kg   SpO2 98%   BMI 25.78 kg/m  General: Alert, oriented, no distress.  Skin: normal turgor, no rashes, warm and dry; multiple tattoos HEENT: Normocephalic, atraumatic. Pupils equal  round and reactive to light; sclera anicteric; extraocular muscles intact;  Nose without nasal septal hypertrophy Mouth/Parynx benign; Mallinpatti scale 3 Neck: No JVD, no carotid bruits; normal carotid upstroke Lungs: clear to ausculatation and percussion; no wheezing or rales Chest wall: without tenderness to palpitation Heart: PMI not displaced, RRR, s1 s2 normal, 1/6 systolic murmur, no diastolic murmur, no rubs, gallops, thrills, or heaves Abdomen: soft, nontender; no hepatosplenomehaly, BS+; abdominal aorta nontender and not dilated by palpation. Back: no CVA tenderness Pulses 2+ Musculoskeletal: full range of motion, normal strength, no joint deformities Extremities: no clubbing cyanosis or edema, Homan's sign negative  Neurologic:  grossly nonfocal; Cranial nerves grossly wnl Psychologic: Normal mood and affect   Labs    Chemistry Recent Labs  Lab 05/21/19 0142 05/22/19 0231 05/23/19 0221  NA 139 138 136  K 3.5 3.4* 4.0  CL 102 102 101  CO2 23 24 23   GLUCOSE 129* 117* 116*  BUN 14 20 24*  CREATININE 1.73* 1.97* 2.18*  CALCIUM 9.7 9.8 9.6  ALBUMIN 3.7 3.7 3.6  GFRNONAA 49* 42* 37*  GFRAA 56* 48* 43*  ANIONGAP 14 12 12      Hematology Recent Labs  Lab 05/21/19 0142 05/22/19 0231 05/23/19 0221  WBC 8.7 8.4 7.7  RBC 5.18 5.17 4.94  HGB 14.2 14.2 13.7  HCT 42.7 42.5 41.1  MCV 82.4 82.2 83.2  MCH 27.4 27.5 27.7  MCHC 33.3 33.4 33.3  RDW 16.0* 16.0* 16.4*  PLT 605* 607* 566*    Cardiac EnzymesNo results for input(s): TROPONINI in the last 168 hours. No results for input(s): TROPIPOC in the last 168 hours.   BNP Recent Labs  Lab 05/20/19 1808  BNP 99.6     DDimer No results for input(s): DDIMER in the last 168 hours.   Lipid Panel     Component Value Date/Time   CHOL 298 (H) 05/20/2019 0627   TRIG 173 (H) 05/20/2019 0627   HDL 31 (L) 05/20/2019 0627   CHOLHDL 9.6 05/20/2019 0627   VLDL 35 05/20/2019 0627   LDLCALC 232 (H) 05/20/2019 0627     Radiology    VAS US RENAL ARTERY DUPLEX  Result Date: 05/21/2019 ABDOMINAL VISCERAL Indications: Hypertension Comparison Study: No prior exam. Performing Technologist: Baldwin Crown RDMS, RVT  Examination Guidelines: A complete evaluation includes B-mode imaging, spectral Doppler, color Doppler, and power Doppler as needed of all accessible portions of each vessel. Bilateral testing is considered an integral part of a complete examination. Limited examinations for reoccurring indications may be performed as noted.  Duplex Findings: +--------------------+--------+--------+------+--------+ Mesenteric          PSV cm/sEDV cm/sPlaqueComments +--------------------+--------+--------+------+--------+ Aorta Prox            107      19                   +--------------------+--------+--------+------+--------+ Celiac Artery Origin  146      48                  +--------------------+--------+--------+------+--------+ SMA Proximal          157      23                  +--------------------+--------+--------+------+--------+    +------------------+--------+--------+-------+ Right Renal ArteryPSV cm/sEDV cm/sComment +------------------+--------+--------+-------+ Origin               65      23           +------------------+--------+--------+-------+  Proximal             83      29           +------------------+--------+--------+-------+ Mid                  99      38           +------------------+--------+--------+-------+ Distal               46      20           +------------------+--------+--------+-------+ +-----------------+--------+--------+-------+ Left Renal ArteryPSV cm/sEDV cm/sComment +-----------------+--------+--------+-------+ Origin              52      16           +-----------------+--------+--------+-------+ Proximal            68      17           +-----------------+--------+--------+-------+ Mid                 59      19           +-----------------+--------+--------+-------+ Distal              66      23           +-----------------+--------+--------+-------+ +------------+--------+--------+----+-----------+--------+--------+----+ Right KidneyPSV cm/sEDV cm/sRI  Left KidneyPSV cm/sEDV cm/sRI   +------------+--------+--------+----+-----------+--------+--------+----+ Upper Pole  36      16      0.57Upper Pole 68      30      0.56 +------------+--------+--------+----+-----------+--------+--------+----+ Mid         36      16      0.57Mid        51      20      0.60 +------------+--------+--------+----+-----------+--------+--------+----+ Lower Pole  34      16      0.54Lower Pole 75      29      0.61  +------------+--------+--------+----+-----------+--------+--------+----+ Hilar       38      16      0.58Hilar      60      22      0.63 +------------+--------+--------+----+-----------+--------+--------+----+ +------------------+-----+------------------+-----+ Right Kidney           Left Kidney             +------------------+-----+------------------+-----+ RAR                    RAR                     +------------------+-----+------------------+-----+ RAR (manual)      0.92 RAR (manual)      0.64  +------------------+-----+------------------+-----+ Cortex                 Cortex                  +------------------+-----+------------------+-----+ Cortex thickness       Corex thickness         +------------------+-----+------------------+-----+ Kidney length (cm)12.00Kidney length (cm)11.20 +------------------+-----+------------------+-----+  Summary: Renal:  Right: Normal size right kidney. No evidence of right renal artery        stenosis. Normal right Resisitive Index. RRV flow present. Left:  Normal size of left kidney. No evidence of left renal artery        stenosis. Normal left Resistive Index. LRV flow  present. Mesenteric: Normal Celiac artery and Superior Mesenteric artery findings.  *See table(s) above for measurements and observations.  Diagnosing physician: Harold Barban MD  Electronically signed by Harold Barban MD on 05/21/2019 at 5:53:40 PM.    Final     Cardiac Studies   Echo 05/20/2019 IMPRESSIONS  1. Mild global reduction in LV systolic function; moderate LVH; grade 1  diastolic dysfunction.  2. Left ventricular ejection fraction, by estimation, is 45 to 50%. The  left ventricle has mildly decreased function. The left ventricle  demonstrates global hypokinesis. There is moderate left ventricular  hypertrophy. Left ventricular diastolic  parameters are consistent with Grade I diastolic dysfunction (impaired  relaxation).  3. Right ventricular  systolic function is normal. The right ventricular  size is normal.  4. The mitral valve is normal in structure. Trivial mitral valve  regurgitation. No evidence of mitral stenosis.  5. The aortic valve is normal in structure. Aortic valve regurgitation is  not visualized. No aortic stenosis is present.  6. The inferior vena cava is normal in size with greater than 50%  respiratory variability, suggesting right atrial pressure of 3 mmHg.   Patient Profile     Russell Hernandez is a 39 y.o. male with a hx of HTN, tobacco abuse and daily marijuana use who was seen for the evaluation of cardiomyopathy at the request of Dr. Cyndia Skeeters.  Assessment & Plan    1.  Hypertensive cardiomyopathy with moderate LVH, EF 45 to 50%.  Suspect secondary to longstanding uncontrolled hypertension.  2.  Accelerated hypertension: BP continues to be elevated; but improved to 160/95 today.   Medications have been titrated and now include amlodipine 10 mg, spironolactone 25 mg daily, chlorthalidone 25 mg, hydralazine 75 mg every 8 hours in addition to carvedilol increased to 12.5 mg twice a day yesterday.  Heart rate today improved in the 70s.  Apparently clonidine may also have been started.  Renal duplex imaging did not reveal any renal vascular etiology without evidence for renal artery stenosis. Patient admits that BP labile  people coming in room.  Consider slight titration of carvedilol tomorrow.  3.  Transient right-sided numbness: Resolved; no CVA CT and MRI  4.  Marked hyperlipidemia: LDL 232, total cholesterol 298, with significant family history of high cholesterol high likelihood for familial hyperlipidemia.  Now on atorvastatin 80 mg.  5.  Stage III chronic kidney disease: Creatinine 1.73 -> 1.97 -> 2.18 today.   Suspect that this may be increased due to lowering of the renal perfusion pressure.  Will dc  chlorthalidone for now and ? spironolactone and ultimately resume depending on renal function.  Euvolemic on exam.  6.  Hypokalemia: Improving with potassium supplementation.  With severe accelerated hypertension, consider evaluation for hyperaldosteronism.  7.  Tobacco and marijuana use: Strongly advised discontinuance.  8. Possible sleep apnea: discussed with patient; consider evaluation as outpatient.   Signed, Troy Sine, MD, [facility] 05/23/2019, 8:57 AM

## 2019-05-23 NOTE — Plan of Care (Signed)
  Problem: Education: Goal: Knowledge of secondary prevention will improve Outcome: Progressing Goal: Knowledge of patient specific risk factors addressed and post discharge goals established will improve Outcome: Progressing   Problem: Education: Goal: Knowledge of General Education information will improve Description: Including pain rating scale, medication(s)/side effects and non-pharmacologic comfort measures Outcome: Progressing   Problem: Health Behavior/Discharge Planning: Goal: Ability to manage health-related needs will improve Outcome: Progressing   Problem: Clinical Measurements: Goal: Ability to maintain clinical measurements within normal limits will improve Outcome: Progressing Goal: Will remain free from infection Outcome: Progressing Goal: Diagnostic test results will improve Outcome: Progressing Goal: Respiratory complications will improve Outcome: Progressing Goal: Cardiovascular complication will be avoided Outcome: Progressing   Problem: Activity: Goal: Risk for activity intolerance will decrease Outcome: Progressing   Problem: Nutrition: Goal: Adequate nutrition will be maintained Outcome: Progressing   Problem: Coping: Goal: Level of anxiety will decrease Outcome: Progressing   Problem: Elimination: Goal: Will not experience complications related to bowel motility Outcome: Progressing Goal: Will not experience complications related to urinary retention Outcome: Progressing   Problem: Pain Managment: Goal: General experience of comfort will improve Outcome: Progressing   Problem: Safety: Goal: Ability to remain free from injury will improve Outcome: Progressing   Problem: Skin Integrity: Goal: Risk for impaired skin integrity will decrease Outcome: Progressing   

## 2019-05-24 LAB — RENAL FUNCTION PANEL
Albumin: 3.7 g/dL (ref 3.5–5.0)
Anion gap: 15 (ref 5–15)
BUN: 28 mg/dL — ABNORMAL HIGH (ref 6–20)
CO2: 24 mmol/L (ref 22–32)
Calcium: 9.6 mg/dL (ref 8.9–10.3)
Chloride: 97 mmol/L — ABNORMAL LOW (ref 98–111)
Creatinine, Ser: 2.26 mg/dL — ABNORMAL HIGH (ref 0.61–1.24)
GFR calc Af Amer: 41 mL/min — ABNORMAL LOW (ref >60)
GFR calc non Af Amer: 35 mL/min — ABNORMAL LOW (ref >60)
Glucose, Bld: 113 mg/dL — ABNORMAL HIGH (ref 70–99)
Phosphorus: 5.1 mg/dL — ABNORMAL HIGH (ref 2.5–4.6)
Potassium: 3.8 mmol/L (ref 3.5–5.1)
Sodium: 136 mmol/L (ref 135–145)

## 2019-05-24 LAB — CBC
HCT: 41.9 % (ref 39.0–52.0)
Hemoglobin: 14.2 g/dL (ref 13.0–17.0)
MCH: 27.8 pg (ref 26.0–34.0)
MCHC: 33.9 g/dL (ref 30.0–36.0)
MCV: 82.2 fL (ref 80.0–100.0)
Platelets: 528 10*3/uL — ABNORMAL HIGH (ref 150–400)
RBC: 5.1 MIL/uL (ref 4.22–5.81)
RDW: 16.2 % — ABNORMAL HIGH (ref 11.5–15.5)
WBC: 7.5 10*3/uL (ref 4.0–10.5)
nRBC: 0 % (ref 0.0–0.2)

## 2019-05-24 LAB — MAGNESIUM: Magnesium: 2.1 mg/dL (ref 1.7–2.4)

## 2019-05-24 MED ORDER — CLONAZEPAM 0.5 MG PO TABS
1.0000 mg | ORAL_TABLET | Freq: Two times a day (BID) | ORAL | Status: DC
  Administered 2019-05-24 (×2): 1 mg via ORAL
  Filled 2019-05-24 (×3): qty 2

## 2019-05-24 NOTE — Progress Notes (Signed)
Became angry and upset as the cardiology PA explaining about the medications and kidney function Saying "you are making me Russell Hernandez, and feel worst with all the medication you are giving me". Patient put his mom on the phone while PA is explaining the medications  Became verbally abusive.Unit director and Dr. Cyndia Skeeters  sat down with the pt. Calmed down after few min.

## 2019-05-24 NOTE — Progress Notes (Addendum)
Progress Note  Patient Name: Russell Hernandez Date of Encounter: 05/24/2019  Primary Cardiologist:New to Dr. Claiborne Billings  Subjective   Patient is very angry and wants to go home.  See below  Inpatient Medications    Scheduled Meds: . amLODipine  10 mg Oral Daily  . aspirin EC  81 mg Oral Daily  . atorvastatin  20 mg Oral q1800  . carvedilol  25 mg Oral BID WC  . clonazePAM  1 mg Oral BID  . cloNIDine  0.2 mg Oral BID  . heparin  5,000 Units Subcutaneous Q8H  . hydrALAZINE  75 mg Oral Q8H  . nicotine  21 mg Transdermal Daily  . ramelteon  8 mg Oral QHS  . sodium chloride flush  3 mL Intravenous Q12H   Continuous Infusions: . sodium chloride 250 mL (05/20/19 0702)   PRN Meds: sodium chloride, acetaminophen, albuterol, alum & mag hydroxide-simeth, labetalol, sodium chloride flush, traMADol, traZODone   Vital Signs    Vitals:   05/24/19 0729 05/24/19 0800 05/24/19 1105 05/24/19 1247  BP: (!) 148/91  (!) 147/104   Pulse:      Resp: 12  19   Temp: 98.2 F (36.8 C)  98.1 F (36.7 C)   TempSrc: Oral  Oral   SpO2:  97% 97% 97%  Weight:      Height:        Intake/Output Summary (Last 24 hours) at 05/24/2019 1424 Last data filed at 05/23/2019 2000 Gross per 24 hour  Intake 480 ml  Output 250 ml  Net 230 ml   Last 3 Weights 05/24/2019 05/23/2019 05/22/2019  Weight (lbs) 192 lb 3.9 oz 195 lb 5.2 oz 194 lb 0.1 oz  Weight (kg) 87.2 kg 88.6 kg 88 kg      Telemetry    Normal sinus rhythm- Personally Reviewed  ECG     no new tracing  Physical Exam   Patient declined examination  Labs    High Sensitivity Troponin:  No results for input(s): TROPONINIHS in the last 720 hours.    Chemistry Recent Labs  Lab 05/22/19 0231 05/23/19 0221 05/24/19 0157  NA 138 136 136  K 3.4* 4.0 3.8  CL 102 101 97*  CO2 24 23 24   GLUCOSE 117* 116* 113*  BUN 20 24* 28*  CREATININE 1.97* 2.18* 2.26*  CALCIUM 9.8 9.6 9.6  ALBUMIN 3.7 3.6 3.7  GFRNONAA 42* 37* 35*  GFRAA 48*  43* 41*  ANIONGAP 12 12 15      Hematology Recent Labs  Lab 05/22/19 0231 05/23/19 0221 05/24/19 0157  WBC 8.4 7.7 7.5  RBC 5.17 4.94 5.10  HGB 14.2 13.7 14.2  HCT 42.5 41.1 41.9  MCV 82.2 83.2 82.2  MCH 27.5 27.7 27.8  MCHC 33.4 33.3 33.9  RDW 16.0* 16.4* 16.2*  PLT 607* 566* 528*   BNP Recent Labs  Lab 05/20/19 1808  BNP 99.6     Radiology    No results found.  Cardiac Studies   Renal US 05/21/19 Summary:  Renal:    Right: Normal size right kidney. No evidence of right renal artery     stenosis. Normal right Resisitive Index. RRV flow present.  Left: Normal size of left kidney. No evidence of left renal artery     stenosis. Normal left Resistive Index. LRV flow present.  Mesenteric:  Normal Celiac artery and Superior Mesenteric artery findings.   Echo 05/20/19 1. Mild global reduction in LV systolic function; moderate LVH; grade 1  diastolic dysfunction.  2. Left ventricular ejection fraction, by estimation, is 45 to 50%. The  left ventricle has mildly decreased function. The left ventricle  demonstrates global hypokinesis. There is moderate left ventricular  hypertrophy. Left ventricular diastolic  parameters are consistent with Grade I diastolic dysfunction (impaired  relaxation).  3. Right ventricular systolic function is normal. The right ventricular  size is normal.  4. The mitral valve is normal in structure. Trivial mitral valve  regurgitation. No evidence of mitral stenosis.  5. The aortic valve is normal in structure. Aortic valve regurgitation is  not visualized. No aortic stenosis is present.  6. The inferior vena cava is normal in size with greater than 50%  respiratory variability, suggesting right atrial pressure of 3 mmHg.  Patient Profile     39 y.o. male with history of hypertension, tobacco abuse and daily marijuana use seen for cardiomyopathy.  Assessment & Plan    1.  Hypertensive cardiomyopathy 2.  Uncontrolled  hypertension 3.  Acute on chronic kidney disease stage III 4.  Polysubstance abuse 5.  Possible sleep apnea 6.  Thrombocytosis 7. Transient Right paresthesia/? TIA   Echocardiogram showed mildly decreased LV function to 45 to 50%.  This was felt due to hypertensive induced cardiomyopathy.  Due to labile and uncontrolled blood pressure he was started on multiple guideline directed antihypertensive in setting of mild LV dysfunction including spironolactone, hydralazine and carvedilol.  He was on home chlorthalidone.  Due to worsening renal function his chlorthalidone and spironolactone has been discontinued.  Last dose of spironolactone was yesterday.  His kidney function gradually getting worse, 2.26 today.   I have seen patient first time today and was explaining medication changes and renal function.  He said " is this hospital trying to kill me and do I need to call my lawyer".  I have explained and educated him as well as significant other over the phone regarding medication changes.  Explained need of guideline directed therapy and side effect of medications.  This was witnessed by pharmacist and nursing staff.  May consider nephrology consult given worsening renal function. ? Needs hydration. BUN/Scr is gradully worsening.  No longer on nephrotoxic agent.  Patient declined examination. We will involve case Freight forwarder and Education officer, museum.  He wants to go home today.  Will defer to primary team and Dr. Claiborne Billings.  For questions or updates, please contact [place] Please consult [email or web address] for contact info under        Signed, Shelva Majestic, MD  05/24/2019, 2:24 PM    Patient seen and examined. Agree with assessment and plan.  Blood pressure is improved today at 148/91 earlier this morning.  Patient is euvolemic on exam.  No longer on diuretic therapy or aldosterone blockade.  Creatinine 2.26 today.  Suspect increased creatinine contributed by reset of renal thermostat in light of previous  severe hypertension now significantly improved with reduced perfusion pressure.  If renal function continues to elevate consider nephrology evaluation.  If blood pressure remains improved tomorrow, consider possible discharge with follow-up in our hypertensive clinic in office and potential outpatient nephrology evaluation if necessary.   Troy Sine, MD, [facility] 05/24/2019 2:24 PM

## 2019-05-24 NOTE — Plan of Care (Signed)

## 2019-05-24 NOTE — Progress Notes (Signed)
PROGRESS NOTE  Russell Hernandez [id number] DOB: [birth year]   PCP: Patient, No Pcp Per  Patient is from: Home.  DOA: 05/19/2019 LOS: 4  Brief Narrative / Interim history: 39 year old male with history of HTN, tobacco use and marijuana use presenting with right-sided numbness that lasted about 2 minutes.  No other associated symptoms.  Has been compliant with his amlodipine and chlorthalidone.  In ED, SBP in the 200s.  DBP in 130s. Na 133.  K2.6. Cr 1.72.  BUN 19.  CBC without significant finding.  UA with a small Hgb, 100 protein and few bacteria.  UDS positive for THC.  COVID-19 negative.  CT head without acute finding.  EKG with LVH and mild SE in anterior leads but doesn't meet criteria for STEMI.  He was admitted for TIA work-up and hypertensive urgency.  MRI brain without acute finding.  Echo with EF of 45 to 50%, global hypokinesis, moderate LVH and G1DD.  Cardiology consulted and made adjustments to blood pressure medications.  Renal Doppler without significant finding.  Blood pressure remains elevated despite multiple antihypertensive medications  Subjective: Feels angry and frustrated about his blood pressure and prolonged hospital stay.  He feels "no one is helping me and your medications are not working".  He also reports feeling anxious and difficulty sleeping here.  He reports using CPAP at one point in his life but not recently.  He says he would like to try CPAP and see if that can help him sleep.  He also likes to resume Klonopin.  He denies headache, chest pain, dyspnea or focal neuro symptoms.  Objective: Vitals:   05/24/19 0623 05/24/19 0729 05/24/19 0800 05/24/19 1105  BP: (!) 168/116 (!) 148/91  (!) 147/104  Pulse: 73     Resp:  12  19  Temp:  98.2 F (36.8 C)  98.1 F (36.7 C)  TempSrc:  Oral  Oral  SpO2:   97%   Weight:      Height:        Intake/Output Summary (Last 24 hours) at 05/24/2019 1121 Last data filed at 05/23/2019 2000 Gross per 24 hour    Intake 600 ml  Output 250 ml  Net 350 ml   Filed Weights   05/22/19 0336 05/23/19 0349 05/24/19 0553  Weight: 88 kg 88.6 kg 87.2 kg    Examination:  GENERAL: No apparent distress.  Angry HEENT: MMM.  Vision and hearing grossly intact.  NECK: Supple.  No apparent JVD.  RESP:  No IWOB. Good air movement bilaterally. CVS: BP 158/109 on the monitor.  RRR. Heart sounds normal.  ABD/GI/GU: Bowel sounds present. Soft. Non tender.  MSK/EXT:  Moves extremities. No apparent deformity. No edema.  SKIN: no apparent skin lesion or wound NEURO: Awake, alert and oriented appropriately.  No apparent focal neuro deficit. PSYCH: Angry.  Procedures:  None  Assessment & Plan: New combined CHF: Echo with EF of 45 to 50%, global hypokinesis, moderate LVH and G1DD.  Likely due to uncontrolled hypertension.  No cardiopulmonary symptoms.  BNP 99.6.  Blood pressure remains elevated but improving. -Cardiology following-goal is to optimize blood pressure -Monitor fluid status, renal function and electrolytes  Hypertensive urgency: BP remains elevated but slowly improving.  UA with small Hgb and 100 protein.  TSH within normal.  Renal duplex without significant finding.  Echocardiogram as above.  No cardiopulmonary symptoms.  He might have some element of whitecoat hypertension and OSA.  Obviously he is anger could contribute to falsely elevated blood  pressure. -May benefit from sleep study-reports snoring and daytime fatigue -Amlodipine 10 mg daily,   -Hydralazine 75 mg 3 times daily -Increase carvedilol to 25 mg twice daily last night -Increase clonidine to 0.2 mg twice daily last night -Continue as needed labetalol -Chlorthalidone and Aldactone discontinued due to AKI -Resume Klonopin at 1 mg twice daily.  Will discontinue his BuSpar given his anger.  -Follow renin and aldosterone levels -Try nightly CPAP -Discussed with patient's RN about checking his blood pressure when he is very calm and not  reporting numbers to him as this would make him angry and anxious.  Patient and patient's wife are in agreement with this.  AKI on CKD-3A/azotemia-suspect progressive CKD versus AKI.  BUN within normal.  Baseline Cr ~1.4> 1.72 (admit)> 1.73> 1.97> 2.18> 2.28.   Could be due to hypertension and/or diuretics but seems to be plateauing.Renal duplex without significant finding.  -Chlorthalidone and Aldactone discontinued -Optimize blood pressure -Continue monitoring  Transient right paresthesia/TIA-likely due to uncontrolled hypertension.  Neuro exam reassuring.  MRI brain without acute finding. -Optimize blood pressure control -LDL 232-started statin.  A1c 6.2%.  Tobacco use disorder -Encouraged cessation. -Nicotine patch  Marijuana use- -counseled.  Prediabetes -Encourage lifestyle change  Hyperlipidemia: LDL 232.  HDL 31. -Started atorvastatin.  Hypokalemia: Likely due to chlorthalidone: Resolved. -Replenished and recheck  Hypophosphatemia: Likely due to AKI. -Continue monitoring  Thrombocytosis: Unclear etiology.  Hgb within normal range.  Normal platelet in 2018.  Improving. -Continue monitoring -May benefit from outpatient hematology follow-up if no improvement  Anxiety: Might be interfering with blood pressure reading. -Continue Klonopin as above.  OSA not on CPAP: He likes to try CPAP. -CPAP at night               DVT prophylaxis: Subcu heparin Code Status: Full code Family Communication: Updated patient's wife at the bedside.  Discharge barrier: New CHF, uncontrolled hypertension and AKI.  Needs close monitoring while fine-tuning his antihypertensive medications until we establish appropriate regimen. Very high risk for adverse outcome.  Patient is from: Home Final disposition: Likely home once medically stable and cleared by cardiology  Consultants: Cardiology   Microbiology summarized: COVID-19 negative MRSA PCR negative  Sch Meds:  Scheduled  Meds: . amLODipine  10 mg Oral Daily  . aspirin EC  81 mg Oral Daily  . atorvastatin  20 mg Oral q1800  . busPIRone  5 mg Oral TID  . carvedilol  25 mg Oral BID WC  . clonazePAM  1 mg Oral BID  . cloNIDine  0.2 mg Oral BID  . heparin  5,000 Units Subcutaneous Q8H  . hydrALAZINE  75 mg Oral Q8H  . nicotine  21 mg Transdermal Daily  . ramelteon  8 mg Oral QHS  . sodium chloride flush  3 mL Intravenous Q12H   Continuous Infusions: . sodium chloride 250 mL (05/20/19 0702)   PRN Meds:.sodium chloride, acetaminophen, albuterol, alum & mag hydroxide-simeth, labetalol, sodium chloride flush, traMADol, traZODone  Antimicrobials: Anti-infectives (From admission, onward)   None       I have personally reviewed the following labs and images: CBC: Recent Labs  Lab 05/19/19 1652 05/19/19 1652 05/20/19 0627 05/21/19 0142 05/22/19 0231 05/23/19 0221 05/24/19 0157  WBC 9.8   < > 8.1 8.7 8.4 7.7 7.5  NEUTROABS 6.0  --   --   --   --   --   --   HGB 14.0   < > 13.6 14.2 14.2 13.7 14.2  HCT  41.5   < > 40.8 42.7 42.5 41.1 41.9  MCV 83.2   < > 81.3 82.4 82.2 83.2 82.2  PLT 564*   < > 557* 605* 607* 566* 528*   < > = values in this interval not displayed.   BMP &GFR Recent Labs  Lab 05/19/19 2137 05/20/19 0157 05/20/19 0627 05/21/19 0142 05/22/19 0231 05/23/19 0221 05/24/19 0157  NA   < >  --  138 139 138 136 136  K   < >  --  3.2* 3.5 3.4* 4.0 3.8  CL   < >  --  99 102 102 101 97*  CO2   < >  --  28 23 24 23 24   GLUCOSE   < >  --  124* 129* 117* 116* 113*  BUN   < >  --  12 14 20  24* 28*  CREATININE   < >  --  1.72* 1.73* 1.97* 2.18* 2.26*  CALCIUM   < >  --  9.1 9.7 9.8 9.6 9.6  MG  --  2.2  --  2.2 2.3 2.2 2.1  PHOS  --   --   --  3.4 3.6 4.0 5.1*   < > = values in this interval not displayed.   Estimated Creatinine Clearance: 49.6 mL/min (A) (by C-G formula based on SCr of 2.26 mg/dL (H)). Liver & Pancreas: Recent Labs  Lab 05/21/19 0142 05/22/19 0231  05/23/19 0221 05/24/19 0157  ALBUMIN 3.7 3.7 3.6 3.7   No results for input(s): LIPASE, AMYLASE in the last 168 hours. No results for input(s): AMMONIA in the last 168 hours. Diabetic: No results for input(s): HGBA1C in the last 72 hours. Recent Labs  Lab 05/19/19 1610  GLUCAP 121*   Cardiac Enzymes: No results for input(s): CKTOTAL, CKMB, CKMBINDEX, TROPONINI in the last 168 hours. No results for input(s): PROBNP in the last 8760 hours. Coagulation Profile: Recent Labs  Lab 05/19/19 1652  INR 0.9   Thyroid Function Tests: No results for input(s): TSH, T4TOTAL, FREET4, T3FREE, THYROIDAB in the last 72 hours. Lipid Profile: No results for input(s): CHOL, HDL, LDLCALC, TRIG, CHOLHDL, LDLDIRECT in the last 72 hours. Anemia Panel: No results for input(s): VITAMINB12, FOLATE, FERRITIN, TIBC, IRON, RETICCTPCT in the last 72 hours. Urine analysis:    Component Value Date/Time   COLORURINE YELLOW 05/19/2019 1642   APPEARANCEUR CLEAR 05/19/2019 1642   LABSPEC 1.020 05/19/2019 1642   PHURINE 6.0 05/19/2019 1642   GLUCOSEU NEGATIVE 05/19/2019 1642   HGBUR SMALL (A) 05/19/2019 1642   BILIRUBINUR NEGATIVE 05/19/2019 1642   KETONESUR NEGATIVE 05/19/2019 1642   PROTEINUR 100 (A) 05/19/2019 1642   NITRITE NEGATIVE 05/19/2019 1642   LEUKOCYTESUR NEGATIVE 05/19/2019 1642   Sepsis Labs: Invalid input(s): PROCALCITONIN, [place]  Microbiology: Recent Results (from the past 240 hour(s))  SARS CORONAVIRUS 2 (TAT 6-24 HRS) Nasopharyngeal Nasopharyngeal Swab     Status: None   Collection Time: 05/19/19 10:42 PM   Specimen: Nasopharyngeal Swab  Result Value Ref Range Status   SARS Coronavirus 2 NEGATIVE NEGATIVE Final    Comment: (NOTE) SARS-CoV-2 target nucleic acids are NOT DETECTED. The SARS-CoV-2 RNA is generally detectable in upper and lower respiratory specimens during the acute phase of infection. Negative results do not preclude SARS-CoV-2 infection, do not rule  out co-infections with other pathogens, and should not be used as the sole basis for treatment or other patient management decisions. Negative results must be combined with clinical observations, patient history, and  epidemiological information. The expected result is Negative. Fact Sheet for Patients: SugarRoll.be Fact Sheet for Healthcare Providers: https://www.woods-mathews.com/ This test is not yet approved or cleared by the [place] FDA and  has been authorized for detection and/or diagnosis of SARS-CoV-2 by FDA under an Emergency Use Authorization (EUA). This EUA will remain  in effect (meaning this test can be used) for the duration of the COVID-19 declaration under Section 56 4(b)(1) of the Act, 21 U.S.C. section 360bbb-3(b)(1), unless the authorization is terminated or revoked sooner. Performed at [facility], [place] [address] [place], [place] [address]   MRSA PCR Screening     Status: None   Collection Time: 05/20/19  1:08 AM   Specimen: Nasal Mucosa; Nasopharyngeal  Result Value Ref Range Status   MRSA by PCR NEGATIVE NEGATIVE Final    Comment:        The GeneXpert MRSA Assay (FDA approved for NASAL specimens only), is one component of a comprehensive MRSA colonization surveillance program. It is not intended to diagnose MRSA infection nor to guide or monitor treatment for MRSA infections. Performed at [facility], [place] [address], [place], [place] [address]     Radiology Studies: No results found.    Tabrina Esty T. [place]  If 7PM-7AM, please contact night-coverage [email or web address] Password [facility] 05/24/2019, 11:21 AM

## 2019-05-24 NOTE — TOC Progression Note (Signed)
Transition of Care [facility]) - Progression Note    Patient Details  Name: Russell Hernandez MRN: [id number] Date of Birth: [birth year]  [facility] of [facility]) CM/SW Contact  Zenon Mayo, RN Phone Number: 05/24/2019, 4:45 PM  Clinical Narrative:    NCM spoke with patient, he states he has transportation at discharge, he will need assistance with meds with [employer] Letter at discharge, not sure when he will be ready due to renal function.  TOC team will continue to follow for dc needs.         Expected Discharge Plan and Services                                                 Social Determinants of Health (SDOH) Interventions    Readmission Risk Interventions No flowsheet data found.

## 2019-05-25 DIAGNOSIS — E785 Hyperlipidemia, unspecified: Secondary | ICD-10-CM

## 2019-05-25 DIAGNOSIS — I5021 Acute systolic (congestive) heart failure: Secondary | ICD-10-CM

## 2019-05-25 DIAGNOSIS — N189 Chronic kidney disease, unspecified: Secondary | ICD-10-CM

## 2019-05-25 DIAGNOSIS — H5213 Myopia, bilateral: Secondary | ICD-10-CM

## 2019-05-25 DIAGNOSIS — G4733 Obstructive sleep apnea (adult) (pediatric): Secondary | ICD-10-CM

## 2019-05-25 LAB — CBC
HCT: 41 % (ref 39.0–52.0)
Hemoglobin: 13.5 g/dL (ref 13.0–17.0)
MCH: 27.3 pg (ref 26.0–34.0)
MCHC: 32.9 g/dL (ref 30.0–36.0)
MCV: 83 fL (ref 80.0–100.0)
Platelets: 501 10*3/uL — ABNORMAL HIGH (ref 150–400)
RBC: 4.94 MIL/uL (ref 4.22–5.81)
RDW: 16.2 % — ABNORMAL HIGH (ref 11.5–15.5)
WBC: 8 10*3/uL (ref 4.0–10.5)
nRBC: 0 % (ref 0.0–0.2)

## 2019-05-25 LAB — RENAL FUNCTION PANEL
Albumin: 3.5 g/dL (ref 3.5–5.0)
Anion gap: 13 (ref 5–15)
BUN: 30 mg/dL — ABNORMAL HIGH (ref 6–20)
CO2: 23 mmol/L (ref 22–32)
Calcium: 9.5 mg/dL (ref 8.9–10.3)
Chloride: 100 mmol/L (ref 98–111)
Creatinine, Ser: 2.17 mg/dL — ABNORMAL HIGH (ref 0.61–1.24)
GFR calc Af Amer: 43 mL/min — ABNORMAL LOW (ref >60)
GFR calc non Af Amer: 37 mL/min — ABNORMAL LOW (ref >60)
Glucose, Bld: 111 mg/dL — ABNORMAL HIGH (ref 70–99)
Phosphorus: 5.3 mg/dL — ABNORMAL HIGH (ref 2.5–4.6)
Potassium: 3.4 mmol/L — ABNORMAL LOW (ref 3.5–5.1)
Sodium: 136 mmol/L (ref 135–145)

## 2019-05-25 LAB — MAGNESIUM: Magnesium: 2.3 mg/dL (ref 1.7–2.4)

## 2019-05-25 MED ORDER — ATORVASTATIN CALCIUM 20 MG PO TABS: 20.0000 mg | ORAL_TABLET | Freq: Every day | ORAL | 1 refills | Status: DC

## 2019-05-25 MED ORDER — AMLODIPINE BESYLATE 10 MG PO TABS: 10.0000 mg | ORAL_TABLET | Freq: Every day | ORAL | 1 refills | Status: DC

## 2019-05-25 MED ORDER — CARVEDILOL 25 MG PO TABS: 25.0000 mg | ORAL_TABLET | Freq: Two times a day (BID) | ORAL | 1 refills | Status: DC

## 2019-05-25 MED ORDER — CLONIDINE HCL 0.2 MG PO TABS: 0.2000 mg | ORAL_TABLET | Freq: Two times a day (BID) | ORAL | 1 refills | Status: DC

## 2019-05-25 MED ORDER — POTASSIUM CHLORIDE CRYS ER 20 MEQ PO TBCR
40.0000 meq | EXTENDED_RELEASE_TABLET | ORAL | Status: AC
  Administered 2019-05-25 (×2): 40 meq via ORAL
  Filled 2019-05-25 (×2): qty 2

## 2019-05-25 MED ORDER — NICOTINE 21 MG/24HR TD PT24: 21.0000 mg | MEDICATED_PATCH | Freq: Every day | TRANSDERMAL | 0 refills | Status: DC

## 2019-05-25 MED ORDER — ONDANSETRON HCL 4 MG PO TABS
4.0000 mg | ORAL_TABLET | Freq: Once | ORAL | Status: AC
  Administered 2019-05-25: 4 mg via ORAL
  Filled 2019-05-25: qty 1

## 2019-05-25 MED ORDER — HYDRALAZINE HCL 25 MG PO TABS: 75.0000 mg | ORAL_TABLET | Freq: Three times a day (TID) | ORAL | 1 refills | Status: DC

## 2019-05-25 MED ORDER — ASPIRIN 81 MG PO TBEC: 81.0000 mg | DELAYED_RELEASE_TABLET | Freq: Every day | ORAL | 1 refills | Status: DC

## 2019-05-25 MED ORDER — ONDANSETRON HCL 4 MG/2ML IJ SOLN
4.0000 mg | Freq: Once | INTRAMUSCULAR | Status: DC
  Filled 2019-05-25: qty 2

## 2019-05-25 NOTE — Progress Notes (Signed)
Progress Note  Patient Name: Russell Hernandez Date of Encounter: 05/25/2019  Primary Cardiologist:New to Dr. Claiborne Billings  Subjective   Events noted yesterday. BP remains elevated, but improved at times.  Creatinine is trending down, now 2.17 today. No issues overnight, wants to go home. Refused exam today.  Inpatient Medications    Scheduled Meds: . amLODipine  10 mg Oral Daily  . aspirin EC  81 mg Oral Daily  . atorvastatin  20 mg Oral q1800  . carvedilol  25 mg Oral BID WC  . cloNIDine  0.2 mg Oral BID  . heparin  5,000 Units Subcutaneous Q8H  . hydrALAZINE  75 mg Oral Q8H  . nicotine  21 mg Transdermal Daily  . ondansetron (ZOFRAN) IV  4 mg Intravenous Once  . ondansetron  4 mg Oral Once  . ramelteon  8 mg Oral QHS  . sodium chloride flush  3 mL Intravenous Q12H   Continuous Infusions: . sodium chloride 250 mL (05/20/19 0702)   PRN Meds: sodium chloride, acetaminophen, albuterol, alum & mag hydroxide-simeth, labetalol, sodium chloride flush, traMADol, traZODone   Vital Signs    Vitals:   05/25/19 0500 05/25/19 0616 05/25/19 0731 05/25/19 1044  BP:  (!) 144/93 (!) 139/97 (!) 160/105  Pulse:  70 68   Resp:   (!) 24 (!) 22  Temp:   98.4 F (36.9 C)   TempSrc:   Oral   SpO2:   98%   Weight: 86.9 kg     Height:        Intake/Output Summary (Last 24 hours) at 05/25/2019 1108 Last data filed at 05/25/2019 1032 Gross per 24 hour  Intake 6 ml  Output 400 ml  Net -394 ml   Last 3 Weights 05/25/2019 05/24/2019 05/23/2019  Weight (lbs) 191 lb 9.3 oz 192 lb 3.9 oz 195 lb 5.2 oz  Weight (kg) 86.9 kg 87.2 kg 88.6 kg      Telemetry    Normal sinus rhythm- Personally Reviewed  ECG    N/A  Physical Exam   Patient refused exam again  Labs    High Sensitivity Troponin:  No results for input(s): TROPONINIHS in the last 720 hours.    Chemistry Recent Labs  Lab 05/23/19 0221 05/24/19 0157 05/25/19 0235  NA 136 136 136  K 4.0 3.8 3.4*  CL 101 97* 100  CO2 23  24 23   GLUCOSE 116* 113* 111*  BUN 24* 28* 30*  CREATININE 2.18* 2.26* 2.17*  CALCIUM 9.6 9.6 9.5  ALBUMIN 3.6 3.7 3.5  GFRNONAA 37* 35* 37*  GFRAA 43* 41* 43*  ANIONGAP 12 15 13      Hematology Recent Labs  Lab 05/23/19 0221 05/24/19 0157 05/25/19 0235  WBC 7.7 7.5 8.0  RBC 4.94 5.10 4.94  HGB 13.7 14.2 13.5  HCT 41.1 41.9 41.0  MCV 83.2 82.2 83.0  MCH 27.7 27.8 27.3  MCHC 33.3 33.9 32.9  RDW 16.4* 16.2* 16.2*  PLT 566* 528* 501*   BNP Recent Labs  Lab 05/20/19 1808  BNP 99.6     Radiology    No results found.  Cardiac Studies   Renal US 05/21/19 Summary:  Renal:    Right: Normal size right kidney. No evidence of right renal artery     stenosis. Normal right Resisitive Index. RRV flow present.  Left: Normal size of left kidney. No evidence of left renal artery     stenosis. Normal left Resistive Index. LRV flow present.  Mesenteric:  Normal Celiac artery and Superior Mesenteric artery findings.   Echo 05/20/19 1. Mild global reduction in LV systolic function; moderate LVH; grade 1  diastolic dysfunction.  2. Left ventricular ejection fraction, by estimation, is 45 to 50%. The  left ventricle has mildly decreased function. The left ventricle  demonstrates global hypokinesis. There is moderate left ventricular  hypertrophy. Left ventricular diastolic  parameters are consistent with Grade I diastolic dysfunction (impaired  relaxation).  3. Right ventricular systolic function is normal. The right ventricular  size is normal.  4. The mitral valve is normal in structure. Trivial mitral valve  regurgitation. No evidence of mitral stenosis.  5. The aortic valve is normal in structure. Aortic valve regurgitation is  not visualized. No aortic stenosis is present.  6. The inferior vena cava is normal in size with greater than 50%  respiratory variability, suggesting right atrial pressure of 3 mmHg.  Patient Profile     39 y.o. male with history  of hypertension, tobacco abuse and daily marijuana use seen for cardiomyopathy.  Assessment & Plan    1.  Hypertensive cardiomyopathy 2.  Uncontrolled hypertension 3.  Acute on chronic kidney disease stage III 4.  Polysubstance abuse 5.  Possible sleep apnea 6.  Thrombocytosis 7. Transient Right paresthesia/? TIA  BP improved, agree that hypertension specialty clinic follow-up with Dr. Oval Linsey would be advisable. Creatinine trending back down. Nothing additional to offer at this point. Will sign-off. Call with questions.  [facility] will sign off.   Medication Recommendations:  Continue current meds Other recommendations (labs, testing, etc):  None Follow up as an outpatient:  Hypertension clinic (Dr. Oval Linsey) or Dr. Claiborne Billings  For questions or updates, please contact [facility] Please consult [email or web address] for contact info under   Pixie Casino, MD, FACC, [place] Director of the Advanced Lipid Disorders &  Cardiovascular Risk Reduction Clinic Diplomate of the American Board of Clinical Lipidology Attending Cardiologist  Direct Dial: [phone]  Fax: [phone]  Website:  [email or web address]    Pixie Casino, MD  05/25/2019, 11:08 AM

## 2019-05-25 NOTE — Discharge Instructions (Addendum)
DASH Eating Plan DASH stands for "Dietary Approaches to Stop Hypertension." The DASH eating plan is a healthy eating plan that has been shown to reduce high blood pressure (hypertension). It may also reduce your risk for type 2 diabetes, heart disease, and stroke. The DASH eating plan may also help with weight loss. What are tips for following this plan?  General guidelines  Avoid eating more than 2,300 mg (milligrams) of salt (sodium) a day. If you have hypertension, you may need to reduce your sodium intake to 1,500 mg a day.  Limit alcohol intake to no more than 1 drink a day for nonpregnant women and 2 drinks a day for men. One drink equals 12 oz of beer, 5 oz of wine, or 1 oz of hard liquor.  Work with your health care provider to maintain a healthy body weight or to lose weight. Ask what an ideal weight is for you.  Get at least 30 minutes of exercise that causes your heart to beat faster (aerobic exercise) most days of the week. Activities may include walking, swimming, or biking.  Work with your health care provider or diet and nutrition specialist (dietitian) to adjust your eating plan to your individual calorie needs. Reading food labels   Check food labels for the amount of sodium per serving. Choose foods with less than 5 percent of the Daily Value of sodium. Generally, foods with less than 300 mg of sodium per serving fit into this eating plan.  To find whole grains, look for the word "whole" as the first word in the ingredient list. Shopping  Buy products labeled as "low-sodium" or "no salt added."  Buy fresh foods. Avoid canned foods and premade or frozen meals. Cooking  Avoid adding salt when cooking. Use salt-free seasonings or herbs instead of table salt or sea salt. Check with your health care provider or pharmacist before using salt substitutes.  Do not fry foods. Cook foods using healthy methods such as baking, boiling, grilling, and broiling instead.  Cook with  heart-healthy oils, such as olive, canola, soybean, or sunflower oil. Meal planning  Eat a balanced diet that includes: ? 5 or more servings of fruits and vegetables each day. At each meal, try to fill half of your plate with fruits and vegetables. ? Up to 6-8 servings of whole grains each day. ? Less than 6 oz of lean meat, poultry, or fish each day. A 3-oz serving of meat is about the same size as a deck of cards. One egg equals 1 oz. ? 2 servings of low-fat dairy each day. ? A serving of nuts, seeds, or beans 5 times each week. ? Heart-healthy fats. Healthy fats called Omega-3 fatty acids are found in foods such as flaxseeds and coldwater fish, like sardines, salmon, and mackerel.  Limit how much you eat of the following: ? Canned or prepackaged foods. ? Food that is high in trans fat, such as fried foods. ? Food that is high in saturated fat, such as fatty meat. ? Sweets, desserts, sugary drinks, and other foods with added sugar. ? Full-fat dairy products.  Do not salt foods before eating.  Try to eat at least 2 vegetarian meals each week.  Eat more home-cooked food and less restaurant, buffet, and fast food.  When eating at a restaurant, ask that your food be prepared with less salt or no salt, if possible. What foods are recommended? The items listed may not be a complete list. Talk with your dietitian about   what dietary choices are best for you. Grains Whole-grain or whole-wheat bread. Whole-grain or whole-wheat pasta. Brown rice. Oatmeal. Quinoa. Bulgur. Whole-grain and low-sodium cereals. Pita bread. Low-fat, low-sodium crackers. Whole-wheat flour tortillas. Vegetables Fresh or frozen vegetables (raw, steamed, roasted, or grilled). Low-sodium or reduced-sodium tomato and vegetable juice. Low-sodium or reduced-sodium tomato sauce and tomato paste. Low-sodium or reduced-sodium canned vegetables. Fruits All fresh, dried, or frozen fruit. Canned fruit in natural juice (without  added sugar). Meat and other protein foods Skinless chicken or turkey. Ground chicken or turkey. Pork with fat trimmed off. Fish and seafood. Egg whites. Dried beans, peas, or lentils. Unsalted nuts, nut butters, and seeds. Unsalted canned beans. Lean cuts of beef with fat trimmed off. Low-sodium, lean deli meat. Dairy Low-fat (1%) or fat-free (skim) milk. Fat-free, low-fat, or reduced-fat cheeses. Nonfat, low-sodium ricotta or cottage cheese. Low-fat or nonfat yogurt. Low-fat, low-sodium cheese. Fats and oils Soft margarine without trans fats. Vegetable oil. Low-fat, reduced-fat, or light mayonnaise and salad dressings (reduced-sodium). Canola, safflower, olive, soybean, and sunflower oils. Avocado. Seasoning and other foods Herbs. Spices. Seasoning mixes without salt. Unsalted popcorn and pretzels. Fat-free sweets. What foods are not recommended? The items listed may not be a complete list. Talk with your dietitian about what dietary choices are best for you. Grains Baked goods made with fat, such as croissants, muffins, or some breads. Dry pasta or rice meal packs. Vegetables Creamed or fried vegetables. Vegetables in a cheese sauce. Regular canned vegetables (not low-sodium or reduced-sodium). Regular canned tomato sauce and paste (not low-sodium or reduced-sodium). Regular tomato and vegetable juice (not low-sodium or reduced-sodium). Pickles. Olives. Fruits Canned fruit in a light or heavy syrup. Fried fruit. Fruit in cream or butter sauce. Meat and other protein foods Fatty cuts of meat. Ribs. Fried meat. Bacon. Sausage. Bologna and other processed lunch meats. Salami. Fatback. Hotdogs. Bratwurst. Salted nuts and seeds. Canned beans with added salt. Canned or smoked fish. Whole eggs or egg yolks. Chicken or turkey with skin. Dairy Whole or 2% milk, cream, and half-and-half. Whole or full-fat cream cheese. Whole-fat or sweetened yogurt. Full-fat cheese. Nondairy creamers. Whipped toppings.  Processed cheese and cheese spreads. Fats and oils Butter. Stick margarine. Lard. Shortening. Ghee. Bacon fat. Tropical oils, such as coconut, palm kernel, or palm oil. Seasoning and other foods Salted popcorn and pretzels. Onion salt, garlic salt, seasoned salt, table salt, and sea salt. Worcestershire sauce. Tartar sauce. Barbecue sauce. Teriyaki sauce. Soy sauce, including reduced-sodium. Steak sauce. Canned and packaged gravies. Fish sauce. Oyster sauce. Cocktail sauce. Horseradish that you find on the shelf. Ketchup. Mustard. Meat flavorings and tenderizers. Bouillon cubes. Hot sauce and Tabasco sauce. Premade or packaged marinades. Premade or packaged taco seasonings. Relishes. Regular salad dressings. Where to find more information:  National Heart, Lung, and Blood Institute: www.nhlbi.nih.gov  American Heart Association: www.heart.org Summary  The DASH eating plan is a healthy eating plan that has been shown to reduce high blood pressure (hypertension). It may also reduce your risk for type 2 diabetes, heart disease, and stroke.  With the DASH eating plan, you should limit salt (sodium) intake to 2,300 mg a day. If you have hypertension, you may need to reduce your sodium intake to 1,500 mg a day.  When on the DASH eating plan, aim to eat more fresh fruits and vegetables, whole grains, lean proteins, low-fat dairy, and heart-healthy fats.  Work with your health care provider or diet and nutrition specialist (dietitian) to adjust your eating plan to your   individual calorie needs. This information is not intended to replace advice given to you by your health care provider. Make sure you discuss any questions you have with your health care provider. Document Revised: 02/10/2017 Document Reviewed: 02/22/2016 Elsevier Patient Education  2020 [employer].   How to Take Your Blood Pressure You can take your blood pressure at home with a machine. You may need to check your blood pressure  at home:  To check if you have high blood pressure (hypertension).  To check your blood pressure over time.  To make sure your blood pressure medicine is working. Supplies needed: You will need a blood pressure machine, or monitor. You can buy one at a drugstore or online. When choosing one:  Choose one with an arm cuff.  Choose one that wraps around your upper arm. Only one finger should fit between your arm and the cuff.  Do not choose one that measures your blood pressure from your wrist or finger. Your doctor can suggest a monitor. How to prepare Avoid these things for 30 minutes before checking your blood pressure:  Drinking caffeine.  Drinking alcohol.  Eating.  Smoking.  Exercising. Five minutes before checking your blood pressure:  Pee.  Sit in a dining chair. Avoid sitting in a soft couch or armchair.  Be quiet. Do not talk. How to take your blood pressure Follow the instructions that came with your machine. If you have a digital blood pressure monitor, these may be the instructions: 1. Sit up straight. 2. Place your feet on the floor. Do not cross your ankles or legs. 3. Rest your left arm at the level of your heart. You may rest it on a table, desk, or chair. 4. Pull up your shirt sleeve. 5. Wrap the blood pressure cuff around the upper part of your left arm. The cuff should be 1 inch (2.5 cm) above your elbow. It is best to wrap the cuff around bare skin. 6. Fit the cuff snugly around your arm. You should be able to place only one finger between the cuff and your arm. 7. Put the cord inside the groove of your elbow. 8. Press the power button. 9. Sit quietly while the cuff fills with air and loses air. 10. Write down the numbers on the screen. 11. Wait 2-3 minutes and then repeat steps 1-10. What do the numbers mean? Two numbers make up your blood pressure. The first number is called systolic pressure. The second is called diastolic pressure. An example of  a blood pressure reading is "120 over 80" (or 120/80). If you are an adult and do not have a medical condition, use this guide to find out if your blood pressure is normal: Normal  First number: below 120.  Second number: below 80. Elevated  First number: 120-129.  Second number: below 80. Hypertension stage 1  First number: 130-139.  Second number: 80-89. Hypertension stage 2  First number: 140 or above.  Second number: 52 or above. Your blood pressure is above normal even if only the top or bottom number is above normal. Follow these instructions at home:  Check your blood pressure as often as your doctor tells you to.  Take your monitor to your next doctor's appointment. Your doctor will: ? Make sure you are using it correctly. ? Make sure it is working right.  Make sure you understand what your blood pressure numbers should be.  Tell your doctor if your medicines are causing side effects. Contact a  doctor if:  Your blood pressure keeps being high. Get help right away if:  Your first blood pressure number is higher than 180.  Your second blood pressure number is higher than 120. This information is not intended to replace advice given to you by your health care provider. Make sure you discuss any questions you have with your health care provider. Document Revised: 02/10/2017 Document Reviewed: 08/07/2015 Elsevier Patient Education  2020 [employer].

## 2019-05-25 NOTE — Discharge Summary (Signed)
Physician Discharge Summary  [name] [id number] DOB: November 10, 1980 DOA: 05/19/2019  PCP: Patient, No Pcp Per  Admit date: 05/19/2019 Discharge date: 05/25/2019  Admitted From: Home Disposition: Home  Recommendations for Outpatient Follow-up:  1. Follow ups as below. 2. Would benefit from outpatient follow-up with cardiology 3. Evaluate blood pressure and make adjustments as appropriate 4. Please obtain CBC/BMP/Mag at follow up 5. Please follow up on the following pending results: Plasma renin and aldosterone levels  Home Health: None Equipment/Devices: None  Discharge Condition: Stable CODE STATUS: Full code  Follow-up Information    [facility] Follow up on 06/11/2019.   Specialty: Family Medicine Why: 2 pm for hospital follow up with Juluis Mire Contact information: [place] [phone] [facility] Hospital Course: 39 year old male with history of HTN, CKD 3-A, tobacco use, "sleep apnea" and marijuana use presenting with right-sided numbness that lasted about 2 minutes.  No other associated symptoms.  Reportedly compliant with his amlodipine and chlorthalidone but missed his meds for a couple of days about a week earlier  In ED, SBP in the 200s.  DBP in 130s. Na 133.  K2.6. Cr 1.72.  BUN 19.  CBC without significant finding.  UA with a small Hgb, 100 protein and few bacteria.  UDS positive for THC.  COVID-19 negative.  CT head without acute finding.  EKG with LVH and mild SE in anterior leads but doesn't meet criteria for STEMI.  He was admitted for TIA work-up and hypertensive urgency.  MRI brain without acute finding.  Echo with EF of 45 to 50%, global hypokinesis, moderate LVH and G1DD. Cardiology consulted and made adjustments to blood pressure medications.  Renal Doppler without significant finding.  Plasma renin and aldosterone levels pending.  Patient was a started on multiple  antihypertensive medications that have been titrated throughout his hospitalization with the guidance of cardiology.  It was also noted that patient's blood pressure improves at night.  There was concern about white coat hypertension, anxiety and anger contributing to his blood pressure.  He has been eager to go home, and angry about his prolonged hospitalization.   Hospitalization complicated by AKI on [id number] that has improved after holding his thiazide and Aldactone.  Patient discharged on multiple antihypertensive medications.  Patient's wife updated over the phone about the plan of discharge  See individual problem list below for more hospital course.  Discharge Diagnoses:  New combined CHF: Echo with EF of 45 to 50%, global hypokinesis, moderate LVH and G1DD.  Likely due to uncontrolled hypertension.  No cardiopulmonary symptoms.  BNP 99.6.  Blood pressure remains elevated but improved.  Cardiology recommended optimal blood pressure control.  Patient was advised on lifestyle change including diet and exercise as below.  Hypertensive urgency: BP remains elevated but slowly improving.  UA with small Hgb and 100 protein.  TSH within normal.  Renal duplex without significant finding.  Echocardiogram as above.  No cardiopulmonary symptoms.  He might have some element of whitecoat hypertension and OSA  Obviously his temper has some role to play. Discharged on: -Amlodipine 10 mg daily,   -Hydralazine 75 mg 3 times daily -Carvedilol to 25 mg twice daily -Clonidine 0.2 mg twice daily -Recommend outpatient sleep study. -Advised to check his blood pressure daily and keep blood pressure log. -Counseled on lifestyle change including diet, sodium restriction and exercise -Provided with reading resources about DASH diet and blood  pressure monitoring -Follow renin and aldosterone levels  AKI on CKD-3A/azotemia-suspect progressive CKD versus AKI.  BUN within normal.  Baseline Cr ~1.4> 1.72 (admit)>  1.73> 1.97> 2.18> 2.28> 2.17.    Likely due to thiazide and Aldactone.  Renal duplex without significant finding.  -Chlorthalidone and Aldactone discontinued -Optimize blood pressure as above -Recheck renal function at follow-up  Transient right paresthesia/TIA-likely due to uncontrolled hypertension.  MRI brain without acute finding.  Neuro exam within normal range except for myopia.  He wears glasses. -Optimize blood pressure control -LDL 232-started statin.  A1c 6.2%.  Tobacco use disorder -Encouraged cessation. -Nicotine patch.  Provided with quit line number  Marijuana use- -counseled.  Prediabetes -Encourage lifestyle change  Hyperlipidemia: LDL 232.  HDL 31. -Started atorvastatin.  Hypokalemia: Replenished prior to discharge -Recheck at follow-up  Hyperphosphatemia: Likely due to AKI.  Improved.  Thrombocytosis: Reactive?  Improved. -Recheck CBC at follow-up  Anxiety/Temper -Could benefit from therapy.  OSA not on CPAP: Refused CPAP. -Recommend evaluation outpatient.  Discharge Instructions  Discharge Instructions    Call MD for:  difficulty breathing, headache or visual disturbances   Complete by: As directed    Call MD for:  extreme fatigue   Complete by: As directed    Call MD for:  persistant dizziness or light-headedness   Complete by: As directed    Call MD for:  persistant nausea and vomiting   Complete by: As directed    Diet - low sodium heart healthy   Complete by: As directed    Discharge instructions   Complete by: As directed    It has been a pleasure taking care of you! You were hospitalized with transient right-sided numbness and markedly elevated blood pressure.  Your MRI did not show stroke.  However, you still have significant risk for stroke due to uncontrolled blood pressure, smoking cigarettes and possible sleep apnea.  We have started you on blood pressure medications.  With that, your blood pressure improved.  We are  discharging you on these medications. Please review your new medication list and the directions before you take your medications.  We strongly encourage you to quit smoking cigarettes.  You may use nicotine patch which could help you. You can also call 1-800-QUIT-NOW ([phone]) for free smoking cessation counseling.  Please follow-up with your primary care doctor as recommended.  As for referral to sleep clinic for sleep apnea evaluation.  Avoid over-the-counter pain medications except plain Tylenol.  We recommend getting blood pressure machine and checking the blood pressure at least once a day, and keeping blood pressure log.  You may get blood pressure cuffs/machine over-the-counter at most pharmacies.  Always sit quietly for about 5 minutes with your arm on a table equal checking the blood pressures.  Your goal blood pressure is less than 130/80 mmHg.  We also recommend lifestyle change including diet with low sodium (preferably less than 2000 mg) and daily exercise.   Take care,       Take care,   Increase activity slowly   Complete by: As directed      Allergies as of 05/25/2019      Reactions   Lisinopril Anaphylaxis   angioedema   Onion Other (See Comments)   Sinus congestion      Medication List    STOP taking these medications   chlorthalidone 25 MG tablet Commonly known as: HYGROTON   ibuprofen 200 MG tablet Commonly known as: ADVIL     TAKE these medications  albuterol 108 (90 Base) MCG/ACT inhaler Commonly known as: VENTOLIN HFA Inhale 2 puffs into the lungs every 4 (four) hours as needed for wheezing or shortness of breath.   amLODipine 10 MG tablet Commonly known as: NORVASC Take 1 tablet (10 mg total) by mouth daily. What changed: medication strength   aspirin 81 MG EC tablet Take 1 tablet (81 mg total) by mouth daily.   atorvastatin 20 MG tablet Commonly known as: LIPITOR Take 1 tablet (20 mg total) by mouth daily at 6 PM.     carvedilol 25 MG tablet Commonly known as: COREG Take 1 tablet (25 mg total) by mouth 2 (two) times daily with a meal.   cloNIDine 0.2 MG tablet Commonly known as: CATAPRES Take 1 tablet (0.2 mg total) by mouth 2 (two) times daily.   hydrALAZINE 25 MG tablet Commonly known as: APRESOLINE Take 3 tablets (75 mg total) by mouth every 8 (eight) hours.   nicotine 21 mg/24hr patch Commonly known as: NICODERM CQ - dosed in mg/24 hours Place 1 patch (21 mg total) onto the skin daily.       Consultations:  Cardiology  Procedures/Studies:  2D Echo on 05/20/2019 1. Mild global reduction in LV systolic function; moderate LVH; grade 1  diastolic dysfunction.  2. Left ventricular ejection fraction, by estimation, is 45 to 50%. The  left ventricle has mildly decreased function. The left ventricle  demonstrates global hypokinesis. There is moderate left ventricular  hypertrophy. Left ventricular diastolic  parameters are consistent with Grade I diastolic dysfunction (impaired  relaxation).  3. Right ventricular systolic function is normal. The right ventricular  size is normal.  4. The mitral valve is normal in structure. Trivial mitral valve  regurgitation. No evidence of mitral stenosis.  5. The aortic valve is normal in structure. Aortic valve regurgitation is  not visualized. No aortic stenosis is present.  6. The inferior vena cava is normal in size with greater than 50%  respiratory variability, suggesting right atrial pressure of 3 mmHg.    CT Head Wo Contrast  Result Date: 05/19/2019 CLINICAL DATA:  Diffuse right body numbness for 2-3 minutes at noon today. Uncontrolled hypertension despite medication. EXAM: CT HEAD WITHOUT CONTRAST TECHNIQUE: Contiguous axial images were obtained from the base of the skull through the vertex without intravenous contrast. COMPARISON:  12/11/2016 FINDINGS: Brain: Normal appearing cerebral hemispheres and posterior fossa structures. Normal  size and position of the ventricles. No intracranial hemorrhage, mass lesion or CT evidence of acute infarction. Vascular: No hyperdense vessel or unexpected calcification. Skull: Normal. Negative for fracture or focal lesion. Sinuses/Orbits: Marked left maxillary sinus mucosal thickening. Unremarkable orbits. Other: None. IMPRESSION: 1. No intracranial abnormality. 2. Marked chronic left maxillary sinusitis. Electronically Signed   By: Claudie Revering M.D.   On: 05/19/2019 17:16   MR BRAIN WO CONTRAST  Result Date: 05/20/2019 CLINICAL DATA:  TIA.  Right-sided numbness today. EXAM: MRI HEAD WITHOUT CONTRAST TECHNIQUE: Multiplanar, multiecho pulse sequences of the brain and surrounding structures were obtained without intravenous contrast. COMPARISON:  Head CT from yesterday FINDINGS: Brain: No acute infarction, hemorrhage, hydrocephalus, extra-axial collection or mass lesion. There are a few tiny FLAIR hyperintensities in the cerebral white matter with chronic lacunes at the right sub cortical parasagittal frontal lobe and at the posterior right putamen. Brain volume is normal. No chronic microhemorrhages Vascular: Intracranial vessels are tortuous, likely related to history of chronic hypertension Skull and upper cervical spine: Normal Sinuses/Orbits: Negative orbits. Chronic maxillary and left ethmoid sinus  disease that is most notable in the left maxillary sinus where there is atelectasis and marked mucosal thickening around central inspissated material. A polyp is seen in the left nasal cavity which measures 16 mm. Other: Motion degraded study. IMPRESSION: 1. No acute finding. 2. Mild white matter disease with lacunes and intracranial vascular tortuosity correlating with history of chronic hypertension. 3. Chronic sinusitis, most notable at the left maxillary sinus where there is an associated middle meatus and nasal cavity polyp. Recommend elective ENT referral. Electronically Signed   By: Monte Fantasia M.D.    On: 05/20/2019 04:47   ECHOCARDIOGRAM COMPLETE  Result Date: 05/20/2019    ECHOCARDIOGRAM REPORT   Patient Name:   Russell Hernandez Date of Exam: 05/20/2019 Medical Rec #:  [id number]           Height:       73.0 in Accession #:    [id number]          Weight:       218.9 lb Date of Birth:  31-Dec-1980          BSA:          2.236 m Patient Age:    39 years            BP:           188/132 mmHg Patient Gender: M                   HR:           77 bpm. Exam Location:  Inpatient Procedure: 2D Echo Indications:    TIA  History:        Patient has no prior history of Echocardiogram examinations.                 Risk Factors:Hypertension.  Sonographer:    Mikki Santee RDCS (AE) Referring Phys: [id number] Charlesetta Ivory Crissa Sowder IMPRESSIONS  1. Mild global reduction in LV systolic function; moderate LVH; grade 1 diastolic dysfunction.  2. Left ventricular ejection fraction, by estimation, is 45 to 50%. The left ventricle has mildly decreased function. The left ventricle demonstrates global hypokinesis. There is moderate left ventricular hypertrophy. Left ventricular diastolic parameters are consistent with Grade I diastolic dysfunction (impaired relaxation).  3. Right ventricular systolic function is normal. The right ventricular size is normal.  4. The mitral valve is normal in structure. Trivial mitral valve regurgitation. No evidence of mitral stenosis.  5. The aortic valve is normal in structure. Aortic valve regurgitation is not visualized. No aortic stenosis is present.  6. The inferior vena cava is normal in size with greater than 50% respiratory variability, suggesting right atrial pressure of 3 mmHg. FINDINGS  Left Ventricle: Left ventricular ejection fraction, by estimation, is 45 to 50%. The left ventricle has mildly decreased function. The left ventricle demonstrates global hypokinesis. The left ventricular internal cavity size was normal in size. There is  moderate left ventricular hypertrophy. Left ventricular  diastolic parameters are consistent with Grade I diastolic dysfunction (impaired relaxation). Right Ventricle: The right ventricular size is normal.Right ventricular systolic function is normal. Left Atrium: Left atrial size was normal in size. Right Atrium: Right atrial size was normal in size. Pericardium: Trivial pericardial effusion is present. Mitral Valve: The mitral valve is normal in structure. Normal mobility of the mitral valve leaflets. Trivial mitral valve regurgitation. No evidence of mitral valve stenosis. Tricuspid Valve: The tricuspid valve is normal in structure. Tricuspid valve regurgitation is trivial. No evidence  of tricuspid stenosis. Aortic Valve: The aortic valve is normal in structure. Aortic valve regurgitation is not visualized. No aortic stenosis is present. Pulmonic Valve: The pulmonic valve was normal in structure. Pulmonic valve regurgitation is not visualized. No evidence of pulmonic stenosis. Aorta: The aortic root is normal in size and structure. Venous: The inferior vena cava is normal in size with greater than 50% respiratory variability, suggesting right atrial pressure of 3 mmHg. IAS/Shunts: No atrial level shunt detected by color flow Doppler. Additional Comments: Mild global reduction in LV systolic function; moderate LVH; grade 1 diastolic dysfunction.  LEFT VENTRICLE PLAX 2D LVIDd:         5.00 cm  Diastology LVIDs:         3.60 cm  LV e' lateral:   6.48 cm/s LV PW:         1.40 cm  LV E/e' lateral: 9.4 LV IVS:        1.30 cm  LV e' medial:    4.85 cm/s LVOT diam:     2.50 cm  LV E/e' medial:  12.6 LV SV:         103 LV SV Index:   46 LVOT Area:     4.91 cm  RIGHT VENTRICLE             IVC RV Basal diam:  3.20 cm     IVC diam: 2.10 cm RV Mid diam:    3.70 cm RV S prime:     16.20 cm/s TAPSE (M-mode): 2.6 cm LEFT ATRIUM             Index       RIGHT ATRIUM           Index LA diam:        4.50 cm 2.01 cm/m  RA Area:     16.00 cm LA Vol (A2C):   37.4 ml 16.73 ml/m RA Volume:    36.00 ml  16.10 ml/m LA Vol (A4C):   49.2 ml 22.00 ml/m LA Biplane Vol: 45.9 ml 20.53 ml/m  AORTIC VALVE LVOT Vmax:   124.00 cm/s LVOT Vmean:  79.100 cm/s LVOT VTI:    0.210 m  AORTA Ao Root diam: 3.60 cm MITRAL VALVE MV Area (PHT): 3.83 cm    SHUNTS MV Decel Time: 198 msec    Systemic VTI:  0.21 m MV E velocity: 60.90 cm/s  Systemic Diam: 2.50 cm MV A velocity: 69.00 cm/s MV E/A ratio:  0.88 Kirk Ruths MD Electronically signed by Kirk Ruths MD Signature Date/Time: 05/20/2019/3:29:32 PM    Final    VAS US RENAL ARTERY DUPLEX  Result Date: 05/21/2019 ABDOMINAL VISCERAL Indications: Hypertension Comparison Study: No prior exam. Performing Technologist: Baldwin Crown RDMS, RVT  Examination Guidelines: A complete evaluation includes B-mode imaging, spectral Doppler, color Doppler, and power Doppler as needed of all accessible portions of each vessel. Bilateral testing is considered an integral part of a complete examination. Limited examinations for reoccurring indications may be performed as noted.  Duplex Findings: +--------------------+--------+--------+------+--------+ Mesenteric          PSV cm/sEDV cm/sPlaqueComments +--------------------+--------+--------+------+--------+ Aorta Prox            107      19                  +--------------------+--------+--------+------+--------+ Celiac Artery Origin  146      48                  +--------------------+--------+--------+------+--------+  SMA Proximal          157      23                  +--------------------+--------+--------+------+--------+    +------------------+--------+--------+-------+ Right Renal ArteryPSV cm/sEDV cm/sComment +------------------+--------+--------+-------+ Origin               65      23           +------------------+--------+--------+-------+ Proximal             83      29           +------------------+--------+--------+-------+ Mid                  99      38            +------------------+--------+--------+-------+ Distal               46      20           +------------------+--------+--------+-------+ +-----------------+--------+--------+-------+ Left Renal ArteryPSV cm/sEDV cm/sComment +-----------------+--------+--------+-------+ Origin              52      16           +-----------------+--------+--------+-------+ Proximal            68      17           +-----------------+--------+--------+-------+ Mid                 59      19           +-----------------+--------+--------+-------+ Distal              66      23           +-----------------+--------+--------+-------+ +------------+--------+--------+----+-----------+--------+--------+----+ Right KidneyPSV cm/sEDV cm/sRI  Left KidneyPSV cm/sEDV cm/sRI   +------------+--------+--------+----+-----------+--------+--------+----+ Upper Pole  36      16      0.57Upper Pole 68      30      0.56 +------------+--------+--------+----+-----------+--------+--------+----+ Mid         36      16      0.57Mid        51      20      0.60 +------------+--------+--------+----+-----------+--------+--------+----+ Lower Pole  34      16      0.54Lower Pole 75      29      0.61 +------------+--------+--------+----+-----------+--------+--------+----+ Hilar       38      16      0.58Hilar      60      22      0.63 +------------+--------+--------+----+-----------+--------+--------+----+ +------------------+-----+------------------+-----+ Right Kidney           Left Kidney             +------------------+-----+------------------+-----+ RAR                    RAR                     +------------------+-----+------------------+-----+ RAR (manual)      0.92 RAR (manual)      0.64  +------------------+-----+------------------+-----+ Cortex                 Cortex                  +------------------+-----+------------------+-----+ Cortex thickness  Corex  thickness         +------------------+-----+------------------+-----+ Kidney length (cm)12.00Kidney length (cm)11.20 +------------------+-----+------------------+-----+  Summary: Renal:  Right: Normal size right kidney. No evidence of right renal artery        stenosis. Normal right Resisitive Index. RRV flow present. Left:  Normal size of left kidney. No evidence of left renal artery        stenosis. Normal left Resistive Index. LRV flow present. Mesenteric: Normal Celiac artery and Superior Mesenteric artery findings.  *See table(s) above for measurements and observations.  Diagnosing physician: Harold Barban MD  Electronically signed by Harold Barban MD on 05/21/2019 at 5:53:40 PM.    Final        Discharge Exam: Vitals:   05/25/19 0731 05/25/19 1044  BP: (!) 139/97 (!) 160/105  Pulse: 68   Resp: (!) 24 (!) 22  Temp: 98.4 F (36.9 C)   SpO2: 98%     GENERAL: No acute distress.  Appears well.  HEENT: MMM.  PERRL.  No facial asymmetry.  Myopia. NECK: Supple.  No apparent JVD.  RESP:  No IWOB. Good air movement bilaterally. CVS:  RRR. Heart sounds normal.  ABD/GI/GU: Bowel sounds present. Soft. Non tender.  MSK/EXT:  Moves extremities. No apparent deformity or edema.  SKIN: no apparent skin lesion or wound NEURO: Awake, alert and oriented appropriately.  CN II-XII intact except for myopia.  Motor 5/5 in all extremities.  Light sensation, rapid alternating movement and reflexes intact and symmetric.  No pronator drift. PSYCH: Angry.  Flat affect   The results of significant diagnostics from this hospitalization (including imaging, microbiology, ancillary and laboratory) are listed below for reference.     Microbiology: Recent Results (from the past 240 hour(s))  SARS CORONAVIRUS 2 (TAT 6-24 HRS) Nasopharyngeal Nasopharyngeal Swab     Status: None   Collection Time: 05/19/19 10:42 PM   Specimen: Nasopharyngeal Swab  Result Value Ref Range Status   SARS Coronavirus 2 NEGATIVE  NEGATIVE Final    Comment: (NOTE) SARS-CoV-2 target nucleic acids are NOT DETECTED. The SARS-CoV-2 RNA is generally detectable in upper and lower respiratory specimens during the acute phase of infection. Negative results do not preclude SARS-CoV-2 infection, do not rule out co-infections with other pathogens, and should not be used as the sole basis for treatment or other patient management decisions. Negative results must be combined with clinical observations, patient history, and epidemiological information. The expected result is Negative. Fact Sheet for Patients: SugarRoll.be Fact Sheet for Healthcare Providers: https://www.woods-mathews.com/ This test is not yet approved or cleared by the [place] FDA and  has been authorized for detection and/or diagnosis of SARS-CoV-2 by FDA under an Emergency Use Authorization (EUA). This EUA will remain  in effect (meaning this test can be used) for the duration of the COVID-19 declaration under Section 56 4(b)(1) of the Act, 21 U.S.C. section 360bbb-3(b)(1), unless the authorization is terminated or revoked sooner. Performed at [facility], [place] [address] [place],  [address]   MRSA PCR Screening     Status: None   Collection Time: 05/20/19  1:08 AM   Specimen: Nasal Mucosa; Nasopharyngeal  Result Value Ref Range Status   MRSA by PCR NEGATIVE NEGATIVE Final    Comment:        The GeneXpert MRSA Assay (FDA approved for NASAL specimens only), is one component of a comprehensive MRSA colonization surveillance program. It is not intended to diagnose MRSA infection nor to guide or monitor treatment for  MRSA infections. Performed at [facility], [place] [address], [place], [place] [address]      Labs: BNP (last 3 results) Recent Labs    05/20/19 1808  BNP [id number]   Basic Metabolic Panel: Recent Labs  Lab 05/21/19 0142 05/22/19 0231 05/23/19 0221 05/24/19 0157  05/25/19 0235  NA 139 138 136 136 136  K 3.5 3.4* 4.0 3.8 3.4*  CL 102 102 101 97* 100  CO2 23 24 23 24 23   GLUCOSE 129* 117* 116* 113* 111*  BUN 14 20 24* 28* 30*  CREATININE 1.73* 1.97* 2.18* 2.26* 2.17*  CALCIUM 9.7 9.8 9.6 9.6 9.5  MG 2.2 2.3 2.2 2.1 2.3  PHOS 3.4 3.6 4.0 5.1* 5.3*   Liver Function Tests: Recent Labs  Lab 05/21/19 0142 05/22/19 0231 05/23/19 0221 05/24/19 0157 05/25/19 0235  ALBUMIN 3.7 3.7 3.6 3.7 3.5   No results for input(s): LIPASE, AMYLASE in the last 168 hours. No results for input(s): AMMONIA in the last 168 hours. CBC: Recent Labs  Lab 05/19/19 1652 05/20/19 0627 05/21/19 0142 05/22/19 0231 05/23/19 0221 05/24/19 0157 05/25/19 0235  WBC 9.8   < > 8.7 8.4 7.7 7.5 8.0  NEUTROABS 6.0  --   --   --   --   --   --   HGB 14.0   < > 14.2 14.2 13.7 14.2 13.5  HCT 41.5   < > 42.7 42.5 41.1 41.9 41.0  MCV 83.2   < > 82.4 82.2 83.2 82.2 83.0  PLT 564*   < > 605* 607* 566* 528* 501*   < > = values in this interval not displayed.   Cardiac Enzymes: No results for input(s): CKTOTAL, CKMB, CKMBINDEX, TROPONINI in the last 168 hours. BNP: Invalid input(s): POCBNP CBG: Recent Labs  Lab 05/19/19 1610  GLUCAP 121*   D-Dimer No results for input(s): DDIMER in the last 72 hours. Hgb A1c No results for input(s): HGBA1C in the last 72 hours. Lipid Profile No results for input(s): CHOL, HDL, LDLCALC, TRIG, CHOLHDL, LDLDIRECT in the last 72 hours. Thyroid function studies No results for input(s): TSH, T4TOTAL, T3FREE, THYROIDAB in the last 72 hours.  Invalid input(s): FREET3 Anemia work up No results for input(s): VITAMINB12, FOLATE, FERRITIN, TIBC, IRON, RETICCTPCT in the last 72 hours. Urinalysis    Component Value Date/Time   COLORURINE YELLOW 05/19/2019 1642   APPEARANCEUR CLEAR 05/19/2019 1642   LABSPEC 1.020 05/19/2019 1642   PHURINE 6.0 05/19/2019 1642   GLUCOSEU NEGATIVE 05/19/2019 1642   HGBUR SMALL (A) 05/19/2019 1642    BILIRUBINUR NEGATIVE 05/19/2019 1642   KETONESUR NEGATIVE 05/19/2019 1642   PROTEINUR 100 (A) 05/19/2019 1642   NITRITE NEGATIVE 05/19/2019 1642   LEUKOCYTESUR NEGATIVE 05/19/2019 1642   Sepsis Labs Invalid input(s): PROCALCITONIN,  WBC,  LACTICIDVEN   Time coordinating discharge: 45 minutes  SIGNED:  Mercy Riding, MD  [facility] 05/25/2019, 4:53 PM  If 7PM-7AM, please contact night-coverage [email or web address] Password [email or web address]

## 2019-05-25 NOTE — Progress Notes (Signed)
0830: Patient c/o discharge home today in the morning. Explained patient the reason for staying in the hospital, but he didn't take a wrong way and he was getting upset.  1040: patient vomited x 1 yellow color. Paging Dr. Cyndia Skeeters regarding this matter. Patient refused to take pill because he felt high. Clarified his words that mean was immobilized & blurred vision. Patient said that he didn't want to go home this condition however, he wanted to go home today. Dr. Cyndia Skeeters came to check patient and there wasn't any symptoms of stroke. now he wanted to go home. Dr. Cyndia Skeeters explained him that his condition is going to be better, but not right away. Patient seemed understanding it. [employer]

## 2019-05-25 NOTE — Progress Notes (Signed)
Patient had another vomiting x 1. Administrated zofran po and patient c/o headache however, he didn't take morning BP medication due to vomiting. Explained patient to take BP medication which will help headache. At the beginning patient refused to take medication, because he thought medications made worst his condition since last night to in the morning. Patient wife came in the room 1200. Explained pt's wife what was going on. Patient & wife still had some medication questions. Dr. Cyndia Skeeters talked patient & wife on the phone. Removed PIV access and Charge nurse gave the discharge instructions. Pt took all his belongings. [facility] [employer]

## 2019-05-25 NOTE — Care Management (Signed)
Provided with [place] letter. Asked MD to send Rxs to participating pharmacy, patient chose [facility] in [place].  Follow up appointment to establish PCP on AVS.  No other CM needs identified.

## 2019-05-27 LAB — ALDOSTERONE + RENIN ACTIVITY W/ RATIO
ALDO / PRA Ratio: 1.9 (ref 0.0–30.0)
Aldosterone: 6 ng/dL (ref 0.0–30.0)
PRA LC/MS/MS: 3.229 ng/mL/hr (ref 0.167–5.380)

## 2019-06-11 ENCOUNTER — Other Ambulatory Visit: Payer: Self-pay

## 2019-06-11 ENCOUNTER — Ambulatory Visit (INDEPENDENT_AMBULATORY_CARE_PROVIDER_SITE_OTHER): Payer: Self-pay | Admitting: Primary Care

## 2019-06-11 ENCOUNTER — Encounter (INDEPENDENT_AMBULATORY_CARE_PROVIDER_SITE_OTHER): Payer: Self-pay | Admitting: Primary Care

## 2019-06-11 VITALS — BP 142/87 | HR 71 | Temp 97.3°F | Wt 198.4 lb

## 2019-06-11 DIAGNOSIS — Z09 Encounter for follow-up examination after completed treatment for conditions other than malignant neoplasm: Secondary | ICD-10-CM

## 2019-06-11 DIAGNOSIS — E876 Hypokalemia: Secondary | ICD-10-CM

## 2019-06-11 DIAGNOSIS — N179 Acute kidney failure, unspecified: Secondary | ICD-10-CM

## 2019-06-11 DIAGNOSIS — Z23 Encounter for immunization: Secondary | ICD-10-CM

## 2019-06-11 DIAGNOSIS — Z7689 Persons encountering health services in other specified circumstances: Secondary | ICD-10-CM

## 2019-06-11 DIAGNOSIS — R079 Chest pain, unspecified: Secondary | ICD-10-CM

## 2019-06-11 DIAGNOSIS — I10 Essential (primary) hypertension: Secondary | ICD-10-CM

## 2019-06-11 DIAGNOSIS — F17200 Nicotine dependence, unspecified, uncomplicated: Secondary | ICD-10-CM

## 2019-06-11 MED ORDER — CLONIDINE HCL 0.2 MG PO TABS: 0.2000 mg | ORAL_TABLET | Freq: Two times a day (BID) | ORAL | 1 refills | Status: DC

## 2019-06-11 MED ORDER — AMLODIPINE BESYLATE 10 MG PO TABS: 10.0000 mg | ORAL_TABLET | Freq: Every day | ORAL | 1 refills | Status: DC

## 2019-06-11 MED ORDER — CARVEDILOL 25 MG PO TABS: 25.0000 mg | ORAL_TABLET | Freq: Two times a day (BID) | ORAL | 1 refills | Status: DC

## 2019-06-11 MED ORDER — ATORVASTATIN CALCIUM 20 MG PO TABS: 20.0000 mg | ORAL_TABLET | Freq: Every day | ORAL | 1 refills | Status: DC

## 2019-06-11 MED ORDER — NITROGLYCERIN 0.4 MG SL SUBL: 0.4000 mg | SUBLINGUAL_TABLET | SUBLINGUAL | 3 refills | Status: DC | PRN

## 2019-06-11 MED ORDER — HYDRALAZINE HCL 25 MG PO TABS: 75.0000 mg | ORAL_TABLET | Freq: Three times a day (TID) | ORAL | 1 refills | Status: DC

## 2019-06-11 MED FILL — ATORVASTATIN CALCIUM 20 MG: 20 | 30 days supply | Qty: 30 | Fill #0

## 2019-06-11 MED FILL — CARVEDILOL 25 MG TABLET: 25 | 30 days supply | Qty: 60 | Fill #0

## 2019-06-11 MED FILL — cloNIDine HCL 0.2 MG TABS: 0.2 | 30 days supply | Qty: 60 | Fill #0

## 2019-06-11 MED FILL — NITROGLYCERIN 0.4 MG TAB SL: 0.4 | 15 days supply | Qty: 25 | Fill #0

## 2019-06-11 MED FILL — AMLODIPINE BESYLATE 10 MG T: 10 | 30 days supply | Qty: 30 | Fill #0

## 2019-06-11 MED FILL — hydrALAZINE HCL 25 MG TABS: 25 | 30 days supply | Qty: 270 | Fill #0

## 2019-06-11 NOTE — Progress Notes (Signed)
Pt complains of side pain from time to time unsure if its constipation Pt states he has some upper chest pain when lying down - states he acid reflux

## 2019-06-11 NOTE — Patient Instructions (Addendum)
[address] [place] [address]   Hypertension, Adult Hypertension is another name for high blood pressure. High blood pressure forces your heart to work harder to pump blood. This can cause problems over time. There are two numbers in a blood pressure reading. There is a top number (systolic) over a bottom number (diastolic). It is best to have a blood pressure that is below 120/80. Healthy choices can help lower your blood pressure, or you may need medicine to help lower it. What are the causes? The cause of this condition is not known. Some conditions may be related to high blood pressure. What increases the risk?  Smoking.  Having type 2 diabetes mellitus, high cholesterol, or both.  Not getting enough exercise or physical activity.  Being overweight.  Having too much fat, sugar, calories, or salt (sodium) in your diet.  Drinking too much alcohol.  Having long-term (chronic) kidney disease.  Having a family history of high blood pressure.  Age. Risk increases with age.  Race. You may be at higher risk if you are African American.  Gender. Men are at higher risk than women before age 12. After age 27, women are at higher risk than men.  Having obstructive sleep apnea.  Stress. What are the signs or symptoms?  High blood pressure may not cause symptoms. Very high blood pressure (hypertensive crisis) may cause: ? Headache. ? Feelings of worry or nervousness (anxiety). ? Shortness of breath. ? Nosebleed. ? A feeling of being sick to your stomach (nausea). ? Throwing up (vomiting). ? Changes in how you see. ? Very bad chest pain. ? Seizures. How is this treated?  This condition is treated by making healthy lifestyle changes, such as: ? Eating healthy foods. ? Exercising more. ? Drinking less alcohol.  Your health care provider may prescribe medicine if lifestyle changes are not enough to get your blood pressure under control, and if: ? Your top number is  above 130. ? Your bottom number is above 80.  Your personal target blood pressure may vary. Follow these instructions at home: Eating and drinking   If told, follow the DASH eating plan. To follow this plan: ? Fill one half of your plate at each meal with fruits and vegetables. ? Fill one fourth of your plate at each meal with whole grains. Whole grains include whole-wheat pasta, brown rice, and whole-grain bread. ? Eat or drink low-fat dairy products, such as skim milk or low-fat yogurt. ? Fill one fourth of your plate at each meal with low-fat (lean) proteins. Low-fat proteins include fish, chicken without skin, eggs, beans, and tofu. ? Avoid fatty meat, cured and processed meat, or chicken with skin. ? Avoid pre-made or processed food.  Eat less than 1,500 mg of salt each day.  Do not drink alcohol if: ? Your doctor tells you not to drink. ? You are pregnant, may be pregnant, or are planning to become pregnant.  If you drink alcohol: ? Limit how much you use to:  0-1 drink a day for women.  0-2 drinks a day for men. ? Be aware of how much alcohol is in your drink. In the [place] one drink equals one 12 oz bottle of beer (355 mL), one 5 oz glass of wine (148 mL), or one 1 oz glass of hard liquor (44 mL). Lifestyle   Work with your doctor to stay at a healthy weight or to lose weight. Ask your doctor what the best weight is for  you.  Get at least 30 minutes of exercise most days of the week. This may include walking, swimming, or biking.  Get at least 30 minutes of exercise that strengthens your muscles (resistance exercise) at least 3 days a week. This may include lifting weights or doing Pilates.  Do not use any products that contain nicotine or tobacco, such as cigarettes, e-cigarettes, and chewing tobacco. If you need help quitting, ask your doctor.  Check your blood pressure at home as told by your doctor.  Keep all follow-up visits as told by your doctor. This is  important. Medicines  Take over-the-counter and prescription medicines only as told by your doctor. Follow directions carefully.  Do not skip doses of blood pressure medicine. The medicine does not work as well if you skip doses. Skipping doses also puts you at risk for problems.  Ask your doctor about side effects or reactions to medicines that you should watch for. Contact a doctor if you:  Think you are having a reaction to the medicine you are taking.  Have headaches that keep coming back (recurring).  Feel dizzy.  Have swelling in your ankles.  Have trouble with your vision. Get help right away if you:  Get a very bad headache.  Start to feel mixed up (confused).  Feel weak or numb.  Feel faint.  Have very bad pain in your: ? Chest. ? Belly (abdomen).  Throw up more than once.  Have trouble breathing. Summary  Hypertension is another name for high blood pressure.  High blood pressure forces your heart to work harder to pump blood.  For most people, a normal blood pressure is less than 120/80.  Making healthy choices can help lower blood pressure. If your blood pressure does not get lower with healthy choices, you may need to take medicine. This information is not intended to replace advice given to you by your health care provider. Make sure you discuss any questions you have with your health care provider. Document Revised: 11/08/2017 Document Reviewed: 11/08/2017 Elsevier Patient Education  [place]. Potassium Content of Foods  The body needs potassium to control blood pressure and to keep the muscles and nervous system healthy. Here are some healthy foods below that are high in potassium. Also you can get the white label salt of "NO SALT" salt substitute, 1/4 teaspoon of this is equivalent to 58meq potassium.   FOODS AND DRINKS HIGH IN POTASSIUM FOODS MODERATE IN POTASSIUM   Fruits  Avocado (cubed),  c / 50 g.  Banana (sliced), 75  g.  Cantaloupe (cubed), 80 g.  Honeydew, 1 wedge / 85 g.  Kiwi (sliced), 90 g.  Nectarine, 1 small / 129 g.  Orange, 1 medium / 131 g. Vegetables  Artichoke,  of a medium / 64 g.  Asparagus (boiled), 90 g..  Broccoli (boiled), 78 g.  Brussels sprout (boiled), 78 g.  Butternut squash (baked), 103 g.  Chickpea (cooked), 82 g.  Green peas (cooked), 80 g.  Kidney beans (cooked), 5 tbsp / 55 g.  Lima beans (cooked),  c / 43 g.  Navy beans (cooked),  c / 61 g.  Spinach (cooked),  c / 45 g.  Sweet potato (baked),  c / 50 g.  Tomato (chopped or sliced), 90 g.  Vegetable juice.  White mushrooms (cooked), 78 g.  Yam (cooked or baked),  c / 34 g.  Zucchini squash (boiled), 90 g. Other Foods and Drinks  Almonds (whole),  c / 36  g.  Fish, 3 oz / 85 g.  Nonfat fruit variety yogurt, 123 g.  Pistachio nuts, 1 oz / 28 g.  Pumpkin seeds, 1 oz / 28 g.  Red meat (broiled, cooked, grilled), 3 oz / 85 g.  Scallops (steamed), 3 oz / 85 g.  Spaghetti sauce,  c / 66 g.  Sunflower seeds (dry roasted), 1 oz / 28 g.  Veggie burger, 1 patty / 70 g. Fruits  Grapefruit,  of the fruit / [id number] g  Plums (sliced), 83 g.  Tangerine, 1 large / 120 g. Vegetables  Carrots (boiled), 78 g.  Carrots (sliced), 61 g.  Rhubarb (cooked with sugar), 120 g.  Rutabaga (cooked), 120 g.  Yellow snap beans (cooked), 63 g. Other Foods and Drinks   Chicken breast (roasted and chopped),  c / 70 g.  Pita bread, 1 large / 64 g.  Shrimp (steamed), 4 oz / 113 g.  Swiss cheese (diced), 70 g.        https://www.cdc.gov/vaccines/hcp/vis/vis-statements/tdap.pdf">  Tdap (Tetanus, Diphtheria, Pertussis) Vaccine: What You Need to Know 1. Why get vaccinated? Tdap vaccine can prevent tetanus, diphtheria, and pertussis. Diphtheria and pertussis spread from person to person. Tetanus enters the body through cuts or wounds.  TETANUS (T) causes painful stiffening of the muscles.  Tetanus can lead to serious health problems, including being unable to open the mouth, having trouble swallowing and breathing, or death.  DIPHTHERIA (D) can lead to difficulty breathing, heart failure, paralysis, or death.  PERTUSSIS (aP), also known as "whooping cough," can cause uncontrollable, violent coughing which makes it hard to breathe, eat, or drink. Pertussis can be extremely serious in babies and young children, causing pneumonia, convulsions, brain damage, or death. In teens and adults, it can cause weight loss, loss of bladder control, passing out, and rib fractures from severe coughing. 2. Tdap vaccine Tdap is only for children 7 years and older, adolescents, and adults.  Adolescents should receive a single dose of Tdap, preferably at age 80 or 54 years. Pregnant women should get a dose of Tdap during every pregnancy, to protect the newborn from pertussis. Infants are most at risk for severe, life-threatening complications from pertussis. Adults who have never received Tdap should get a dose of Tdap. Also, adults should receive a booster dose every 10 years, or earlier in the case of a severe and dirty wound or burn. Booster doses can be either Tdap or Td (a different vaccine that protects against tetanus and diphtheria but not pertussis). Tdap may be given at the same time as other vaccines. 3. Talk with your health care provider Tell your vaccine provider if the person getting the vaccine:  Has had an allergic reaction after a previous dose of any vaccine that protects against tetanus, diphtheria, or pertussis, or has any severe, life-threatening allergies.  Has had a coma, decreased level of consciousness, or prolonged seizures within 7 days after a previous dose of any pertussis vaccine (DTP, DTaP, or Tdap).  Has seizures or another nervous system problem.  Has ever had Guillain-Barr Syndrome (also called GBS).  Has had severe pain or swelling after a previous dose of any  vaccine that protects against tetanus or diphtheria. In some cases, your health care provider may decide to postpone Tdap vaccination to a future visit.  People with minor illnesses, such as a cold, may be vaccinated. People who are moderately or severely ill should usually wait until they recover before getting Tdap vaccine.  Your health care  provider can give you more information. 4. Risks of a vaccine reaction  Pain, redness, or swelling where the shot was given, mild fever, headache, feeling tired, and nausea, vomiting, diarrhea, or stomachache sometimes happen after Tdap vaccine. People sometimes faint after medical procedures, including vaccination. Tell your provider if you feel dizzy or have vision changes or ringing in the ears.  As with any medicine, there is a very remote chance of a vaccine causing a severe allergic reaction, other serious injury, or death. 5. What if there is a serious problem? An allergic reaction could occur after the vaccinated person leaves the clinic. If you see signs of a severe allergic reaction (hives, swelling of the face and throat, difficulty breathing, a fast heartbeat, dizziness, or weakness), call 9-1-1 and get the person to the nearest hospital. For other signs that concern you, call your health care provider.  Adverse reactions should be reported to the Vaccine Adverse Event Reporting System (VAERS). Your health care provider will usually file this report, or you can do it yourself. Visit the VAERS website at www.vaers.SamedayNews.es or call 564-793-2359. VAERS is only for reporting reactions, and VAERS staff do not give medical advice. 6. The National Vaccine Injury Compensation Program The [employer] Vaccine Injury Compensation Program (VICP) is a federal program that was created to compensate people who may have been injured by certain vaccines. Visit the VICP website at GoldCloset.com.ee or call (718) 238-6704 to learn about the program and  about filing a claim. There is a time limit to file a claim for compensation. 7. How can I learn more?  Ask your health care provider.  Call your local or state health department.  Contact the Centers for Disease Control and Prevention (CDC): ? Call 224-371-7604 (1-800-CDC-INFO) or ? Visit CDC's website at http://hunter.com/ Vaccine Information Statement Tdap (Tetanus, Diphtheria, Pertussis) Vaccine (06/13/2018) This information is not intended to replace advice given to you by your health care provider. Make sure you discuss any questions you have with your health care provider. Document Revised: 06/22/2018 Document Reviewed: 06/25/2018 Elsevier Patient Education  [place].

## 2019-06-11 NOTE — Progress Notes (Signed)
Subjective:  Patient ID: Russell Hernandez, male    DOB: [birth year]  Age: 39 y.o. MRN: [id number]  CC: Hospitalization Follow-up (hypertensive urgency and CHF )   HPI Russell Hernandez  Is a 39 year old African American male that presented to the emergency room on  right-sided numbness admitted for possible TIA and hypertensive emergency. MRI negative for TIIA. He presents today to establish care and management of hypertension.  Past Medical History:  Diagnosis Date  . Hypertension     Past Surgical History:  Procedure Laterality Date  . No past surgery      Family History  Problem Relation Age of Onset  . Hypertension Mother   . Hyperlipidemia Mother   . Hypertension Maternal Grandmother   . Heart disease Maternal Grandmother     Social History   Tobacco Use  . Smoking status: Current Every Day Smoker    Packs/day: 0.50    Types: Cigarettes  . Smokeless tobacco: Never Used  Substance Use Topics  . Alcohol use: Yes    Comment: occ    ROS Review of Systems  Eyes: Positive for visual disturbance.  Respiratory: Positive for shortness of breath.   Cardiovascular: Positive for chest pain.       Especially lying down   Gastrointestinal: Positive for constipation.  All other systems reviewed and are negative.   Objective:   Today's Vitals: BP (!) 142/87 (BP Location: Right Arm, Patient Position: Sitting, Cuff Size: Normal)   Pulse 71   Temp (!) 97.3 F (36.3 C) (Temporal)   Wt 198 lb 6.4 oz (90 kg)   SpO2 99%   BMI 26.18 kg/m   Physical Exam Vitals reviewed.  Constitutional:      Appearance: Normal appearance.  HENT:     Head: Normocephalic.     Right Ear: Tympanic membrane normal.     Left Ear: Tympanic membrane normal.     Nose: Nose normal.  Eyes:     Extraocular Movements: Extraocular movements intact.     Pupils: Pupils are equal, round, and reactive to light.  Cardiovascular:     Rate and Rhythm: Normal rate and regular rhythm.   Pulmonary:     Effort: Pulmonary effort is normal.     Breath sounds: Normal breath sounds.  Musculoskeletal:        General: Normal range of motion.     Cervical back: Normal range of motion and neck supple.  Skin:    General: Skin is warm and dry.  Neurological:     Mental Status: He is alert and oriented to person, place, and time.  Psychiatric:        Mood and Affect: Mood normal.        Behavior: Behavior normal.        Thought Content: Thought content normal.        Judgment: Judgment normal.     Assessment & Plan:  Russell Hernandez was seen today for hospitalization follow-up.  Diagnoses and all orders for this visit:  Need for Tdap vaccination Tdap is recommended every 10 years for adults weekly or primary she gets tetanus.. -     Tdap vaccine greater than or equal to 7yo IM  Essential hypertension Counseled on blood pressure goal of less than 130/80, low-sodium, DASH diet, medication compliance, 150 minutes of moderate intensity exercise per week. Discussed medication compliance, adverse effects. -     Ambulatory referral to Cardiology -     CBC with Differential -  [facility] discharge follow-up Recommendations for Outpatient Follow-up:  1. Establish care with PCP 2. Referred to  Cardiology and refilled medication  3. Please obtain CBC/BMP/Mag at follow up 4.  Plasma renin and aldosterone levels reviewed normal   Encounter to establish care Juluis Mire, NP-C will be your  (PCP) she is mastered prepared . Able to diagnosed and treatment also  answer health concern as well as continuing care of varied medical conditions, not limited by cause, organ system, or diagnosis.    Need for Tdap vaccination Tdap is recommended every 10 years for adults weekly or primary she gets tetanus..  A -     Tdap vaccine greater than or equal to 7yo IM  Hypokalemia Potassium Content of Foods  The body needs potassium to control blood pressure and to keep the  muscles and nervous system healthy. Here are some healthy foods below that are high in potassium. Also you can get the white label salt of "NO SALT" salt substitute, 1/4 teaspoon of this is equivalent to 46mq potassium.  -  AKI (acute kidney injury) (HTillar Elevated in hospital repeat  -     CMP14+EGFR  Other orders -     hydrALAZINE (APRESOLINE) 25 MG tablet; Take 3 tablets (75 mg total) by mouth every 8 (eight) hours. -     carvedilol (COREG) 25 MG tablet; Take 1 tablet (25 mg total) by mouth 2 (two) times daily with a meal. -     atorvastatin (LIPITOR) 20 MG tablet; Take 1 tablet (20 mg total) by mouth daily at 6 PM. -     amLODipine (NORVASC) 10 MG tablet; Take 1 tablet (10 mg total) by mouth daily. -     cloNIDine (CATAPRES) 0.2 MG tablet; Take 1 tablet (0.2 mg total) by mouth 2 (two) times daily.    Outpatient Encounter Medications as of 06/11/2019  Medication Sig  . amLODipine (NORVASC) 10 MG tablet Take 1 tablet (10 mg total) by mouth daily.  .Marland Kitchenaspirin EC 81 MG EC tablet Take 1 tablet (81 mg total) by mouth daily.  .Marland Kitchenatorvastatin (LIPITOR) 20 MG tablet Take 1 tablet (20 mg total) by mouth daily at 6 PM.  . carvedilol (COREG) 25 MG tablet Take 1 tablet (25 mg total) by mouth 2 (two) times daily with a meal.  . cloNIDine (CATAPRES) 0.2 MG tablet Take 1 tablet (0.2 mg total) by mouth 2 (two) times daily.  . hydrALAZINE (APRESOLINE) 25 MG tablet Take 3 tablets (75 mg total) by mouth every 8 (eight) hours.  . [DISCONTINUED] amLODipine (NORVASC) 10 MG tablet Take 1 tablet (10 mg total) by mouth daily.  . [DISCONTINUED] atorvastatin (LIPITOR) 20 MG tablet Take 1 tablet (20 mg total) by mouth daily at 6 PM.  . [DISCONTINUED] carvedilol (COREG) 25 MG tablet Take 1 tablet (25 mg total) by mouth 2 (two) times daily with a meal.  . [DISCONTINUED] cloNIDine (CATAPRES) 0.2 MG tablet Take 1 tablet (0.2 mg total) by mouth 2 (two) times daily.  . [DISCONTINUED] hydrALAZINE (APRESOLINE) 25 MG tablet  Take 3 tablets (75 mg total) by mouth every 8 (eight) hours.  .Marland Kitchenalbuterol (VENTOLIN HFA) 108 (90 Base) MCG/ACT inhaler Inhale 2 puffs into the lungs every 4 (four) hours as needed for wheezing or shortness of breath. (Patient not taking: Reported on 05/20/2019)  . nicotine (NICODERM CQ - DOSED IN MG/24 HOURS) 21 mg/24hr patch Place 1 patch (21 mg total) onto the skin daily. (Patient not taking: Reported on  06/11/2019)  . nitroGLYCERIN (NITROSTAT) 0.4 MG SL tablet Place 1 tablet (0.4 mg total) under the tongue every 5 (five) minutes as needed for chest pain.  . [DISCONTINUED] hydrochlorothiazide (HYDRODIURIL) 25 MG tablet Take 1 tablet (25 mg total) by mouth daily.  . [DISCONTINUED] lisinopril (PRINIVIL,ZESTRIL) 10 MG tablet Take 1 tablet (10 mg total) by mouth daily.  . [DISCONTINUED] omeprazole (PRILOSEC) 20 MG capsule Take 1 capsule (20 mg total) by mouth daily.  . [DISCONTINUED] ranitidine (ZANTAC) 150 MG tablet Take 1 tablet (150 mg total) by mouth 2 (two) times daily.   No facility-administered encounter medications on file as of 06/11/2019.    Follow-up: Return in about 2 months (around 08/11/2019) for HTN/kidney function in person.    [name] NP

## 2019-06-12 LAB — CBC WITH DIFFERENTIAL/PLATELET
Basophils Absolute: 0.1 10*3/uL (ref 0.0–0.2)
Basos: 1 %
EOS (ABSOLUTE): 0.1 10*3/uL (ref 0.0–0.4)
Eos: 2 %
Hematocrit: 37.4 % — ABNORMAL LOW (ref 37.5–51.0)
Hemoglobin: 12.3 g/dL — ABNORMAL LOW (ref 13.0–17.7)
Immature Grans (Abs): 0 10*3/uL (ref 0.0–0.1)
Immature Granulocytes: 0 %
Lymphocytes Absolute: 1.3 10*3/uL (ref 0.7–3.1)
Lymphs: 20 %
MCH: 27.5 pg (ref 26.6–33.0)
MCHC: 32.9 g/dL (ref 31.5–35.7)
MCV: 84 fL (ref 79–97)
Monocytes Absolute: 0.8 10*3/uL (ref 0.1–0.9)
Monocytes: 12 %
Neutrophils Absolute: 4.2 10*3/uL (ref 1.4–7.0)
Neutrophils: 65 %
Platelets: 504 10*3/uL — ABNORMAL HIGH (ref 150–450)
RBC: 4.48 x10E6/uL (ref 4.14–5.80)
RDW: 16.1 % — ABNORMAL HIGH (ref 11.6–15.4)
WBC: 6.4 10*3/uL (ref 3.4–10.8)

## 2019-06-12 LAB — CMP14+EGFR
ALT: 10 IU/L (ref 0–44)
AST: 12 IU/L (ref 0–40)
Albumin/Globulin Ratio: 1.1 — ABNORMAL LOW (ref 1.2–2.2)
Albumin: 3.9 g/dL — ABNORMAL LOW (ref 4.0–5.0)
Alkaline Phosphatase: 48 IU/L (ref 39–117)
BUN/Creatinine Ratio: 10 (ref 9–20)
BUN: 16 mg/dL (ref 6–20)
Bilirubin Total: <0.2 mg/dL (ref 0.0–1.2)
CO2: 21 mmol/L (ref 20–29)
Calcium: 9.5 mg/dL (ref 8.7–10.2)
Chloride: 103 mmol/L (ref 96–106)
Creatinine, Ser: 1.68 mg/dL — ABNORMAL HIGH (ref 0.76–1.27)
GFR calc Af Amer: 58 mL/min/{1.73_m2} — ABNORMAL LOW (ref >59)
GFR calc non Af Amer: 50 mL/min/{1.73_m2} — ABNORMAL LOW (ref >59)
Globulin, Total: 3.4 g/dL (ref 1.5–4.5)
Glucose: 88 mg/dL (ref 65–99)
Potassium: 4.3 mmol/L (ref 3.5–5.2)
Sodium: 140 mmol/L (ref 134–144)
Total Protein: 7.3 g/dL (ref 6.0–8.5)

## 2019-06-13 ENCOUNTER — Ambulatory Visit (INDEPENDENT_AMBULATORY_CARE_PROVIDER_SITE_OTHER): Payer: Self-pay | Admitting: Cardiology

## 2019-06-13 ENCOUNTER — Other Ambulatory Visit: Payer: Self-pay

## 2019-06-13 ENCOUNTER — Encounter: Payer: Self-pay | Admitting: *Deleted

## 2019-06-13 VITALS — BP 130/80 | HR 69 | Ht 72.0 in | Wt 195.0 lb

## 2019-06-13 DIAGNOSIS — Z720 Tobacco use: Secondary | ICD-10-CM

## 2019-06-13 DIAGNOSIS — R931 Abnormal findings on diagnostic imaging of heart and coronary circulation: Secondary | ICD-10-CM

## 2019-06-13 DIAGNOSIS — R0989 Other specified symptoms and signs involving the circulatory and respiratory systems: Secondary | ICD-10-CM

## 2019-06-13 DIAGNOSIS — N1831 Chronic kidney disease, stage 3a: Secondary | ICD-10-CM

## 2019-06-13 DIAGNOSIS — G459 Transient cerebral ischemic attack, unspecified: Secondary | ICD-10-CM

## 2019-06-13 DIAGNOSIS — I10 Essential (primary) hypertension: Secondary | ICD-10-CM

## 2019-06-13 HISTORY — DX: Abnormal findings on diagnostic imaging of heart and coronary circulation: R93.1

## 2019-06-13 HISTORY — DX: Chronic kidney disease, stage 3a: N18.31

## 2019-06-13 HISTORY — DX: Other specified symptoms and signs involving the circulatory and respiratory systems: R09.89

## 2019-06-13 NOTE — Patient Instructions (Signed)
Medication Instructions:  No medication changes *If you need a refill on your cardiac medications before your next appointment, please call your pharmacy*   Lab Work: Your physician recommends that you have labs done today. We will do a BMET and Magnesium If you have labs (blood work) drawn today and your tests are completely normal, you will receive your results only by: Marland Kitchen MyChart Message (if you have MyChart) OR . A paper copy in the mail If you have any lab test that is abnormal or we need to change your treatment, we will call you to review the results.   Testing/Procedures: None ordered   Follow-Up: At [facility], you and your health needs are our priority.  As part of our continuing mission to provide you with exceptional heart care, we have created designated Provider Care Teams.  These Care Teams include your primary Cardiologist (physician) and Advanced Practice Providers (APPs -  Physician Assistants and Nurse Practitioners) who all work together to provide you with the care you need, when you need it.  We recommend signing up for the patient portal called "MyChart".  Sign up information is provided on this After Visit Summary.  MyChart is used to connect with patients for Virtual Visits (Telemedicine).  Patients are able to view lab/test results, encounter notes, upcoming appointments, etc.  Non-urgent messages can be sent to your provider as well.   To learn more about what you can do with MyChart, go to [email or web address].    Your next appointment:   1 month(s)  The format for your next appointment:   In Person  Provider:   Berniece Salines, DO   Other Instructions NA

## 2019-06-13 NOTE — Progress Notes (Signed)
Cardiology Office Note:    Date:  06/13/2019   ID:  Russell Hernandez, DOB May 16, 1980, MRN [id number]  PCP:  [name], NP  Cardiologist:  Berniece Salines, DO  Electrophysiologist:  None   Referring MD: [name], NP   Chief Complaint  Patient presents with  . Hypertension    History of Present Illness:    Russell Hernandez is a 39 y.o. male with a hx of hypertension, chronic kidney disease stage III a, marijuana use, tobacco use, recent TIA presents today to establish cardiac care.  The patient was admitted at the [facility] in March for concerns for right sided numbness which time he was noted to be hypertensive.  During his hospital stay there is an echocardiogram done that showed that his EF was 45 to 50%.  The patient was asked to follow-up with cardiology.  He does tell me today that he is experiencing significant stress, he has cut back on his smoking and is trying to take his medication as he was prescribed.  Past Medical History:  Diagnosis Date  . AKI (acute kidney injury) ([place]) 05/20/2019  . Hypertension   . Hypertensive urgency 05/19/2019  . Hypokalemia 05/20/2019  . TIA (transient ischemic attack) 05/21/2019    Past Surgical History:  Procedure Laterality Date  . No past surgery      Current Medications: Current Meds  Medication Sig  . amLODipine (NORVASC) 10 MG tablet Take 1 tablet (10 mg total) by mouth daily.  Marland Kitchen aspirin EC 81 MG EC tablet Take 1 tablet (81 mg total) by mouth daily.  Marland Kitchen atorvastatin (LIPITOR) 20 MG tablet Take 1 tablet (20 mg total) by mouth daily at 6 PM.  . carvedilol (COREG) 25 MG tablet Take 1 tablet (25 mg total) by mouth 2 (two) times daily with a meal.  . cloNIDine (CATAPRES) 0.2 MG tablet Take 1 tablet (0.2 mg total) by mouth 2 (two) times daily.  . hydrALAZINE (APRESOLINE) 25 MG tablet Take 3 tablets (75 mg total) by mouth every 8 (eight) hours.  . nitroGLYCERIN (NITROSTAT) 0.4 MG SL tablet Place 1 tablet  (0.4 mg total) under the tongue every 5 (five) minutes as needed for chest pain.     Allergies:   Lisinopril and Onion   Social History   Socioeconomic History  . Marital status: Legally Separated    Spouse name: Not on file  . Number of children: Not on file  . Years of education: Not on file  . Highest education level: Not on file  Occupational History  . Not on file  Tobacco Use  . Smoking status: Former Smoker    Packs/day: 0.50    Types: Cigarettes    Quit date: 06/06/2019    Years since quitting: 0.0  . Smokeless tobacco: Never Used  Substance and Sexual Activity  . Alcohol use: Yes    Comment: occ  . Drug use: Yes    Types: Marijuana  . Sexual activity: Not on file  Other Topics Concern  . Not on file  Social History Narrative  . Not on file   Social Determinants of Health   Financial Resource Strain:   . Difficulty of Paying Living Expenses:   Food Insecurity:   . Worried About Charity fundraiser in the Last Year:   . Arboriculturist in the Last Year:   Transportation Needs:   . Film/video editor (Medical):   Marland Kitchen Lack of Transportation (Non-Medical):  Physical Activity:   . Days of Exercise per Week:   . Minutes of Exercise per Session:   Stress:   . Feeling of Stress :   Social Connections:   . Frequency of Communication with Friends and Family:   . Frequency of Social Gatherings with Friends and Family:   . Attends Religious Services:   . Active Member of Clubs or Organizations:   . Attends Archivist Meetings:   Marland Kitchen Marital Status:      Family History: The patient's family history includes Heart disease in his maternal grandmother; Hyperlipidemia in his mother; Hypertension in his maternal grandmother and mother.  ROS:   Review of Systems  Constitution: Negative for decreased appetite, fever and weight gain.  HENT: Negative for congestion, ear discharge, hoarse voice and sore throat.   Eyes: Negative for discharge, redness, vision  loss in right eye and visual halos.  Cardiovascular: Negative for chest pain, dyspnea on exertion, leg swelling, orthopnea and palpitations.  Respiratory: Negative for cough, hemoptysis, shortness of breath and snoring.   Endocrine: Negative for heat intolerance and polyphagia.  Hematologic/Lymphatic: Negative for bleeding problem. Does not bruise/bleed easily.  Skin: Negative for flushing, nail changes, rash and suspicious lesions.  Musculoskeletal: Negative for arthritis, joint pain, muscle cramps, myalgias, neck pain and stiffness.  Gastrointestinal: Negative for abdominal pain, bowel incontinence, diarrhea and excessive appetite.  Genitourinary: Negative for decreased libido, genital sores and incomplete emptying.  Neurological: Negative for brief paralysis, focal weakness, headaches and loss of balance.  Psychiatric/Behavioral: Negative for altered mental status, depression and suicidal ideas.  Allergic/Immunologic: Negative for HIV exposure and persistent infections.    EKGs/Labs/Other Studies Reviewed:    The following studies were reviewed today:   EKG:  The ekg ordered today demonstrates sinus rhythm, heart rate 69 bpm, nonspecific intraventricular conduction defect.  Transthoracic echocardiogram IMPRESSIONS 05/20/19 1. Mild global reduction in LV systolic function; moderate LVH; grade 1  diastolic dysfunction.  2. Left ventricular ejection fraction, by estimation, is 45 to 50%. The  left ventricle has mildly decreased function. The left ventricle  demonstrates global hypokinesis. There is moderate left ventricular  hypertrophy. Left ventricular diastolic  parameters are consistent with Grade I diastolic dysfunction (impaired  relaxation).  3. Right ventricular systolic function is normal. The right ventricular  size is normal.  4. The mitral valve is normal in structure. Trivial mitral valve  regurgitation. No evidence of mitral stenosis.  5. The aortic valve is normal  in structure. Aortic valve regurgitation is  not visualized. No aortic stenosis is present.  6. The inferior vena cava is normal in size with greater than 50%  respiratory variability, suggesting right atrial pressure of 3 mmHg.    Renal US Right: Normal size right kidney. No evidence of right renal artery     stenosis. Normal right Resisitive Index. RRV flow present.  Left: Normal size of left kidney. No evidence of left renal artery     stenosis. Normal left Resistive Index. LRV flow present.  Mesenteric:  Normal Celiac artery and Superior Mesenteric artery findings.   CT head IMPRESSION:05/19/19 1. No intracranial abnormality. 2. Marked chronic left maxillary sinusitis.   MRI IMPRESSION: 05/20/19 1. No acute finding. 2. Mild white matter disease with lacunes and intracranial vascular tortuosity correlating with history of chronic hypertension. 3. Chronic sinusitis, most notable at the left maxillary sinus where there is an associated middle meatus and nasal cavity polyp. Recommend elective ENT referral.  Recent Labs: 05/20/2019: B Natriuretic Peptide  99.6; TSH 1.894 05/25/2019: Magnesium 2.3 06/11/2019: ALT 10; BUN 16; Creatinine, Ser 1.68; Hemoglobin 12.3; Platelets 504; Potassium 4.3; Sodium 140  Recent Lipid Panel    Component Value Date/Time   CHOL 298 (H) 05/20/2019 0627   TRIG 173 (H) 05/20/2019 0627   HDL 31 (L) 05/20/2019 0627   CHOLHDL 9.6 05/20/2019 0627   VLDL 35 05/20/2019 0627   LDLCALC 232 (H) 05/20/2019 0627    Physical Exam:    VS:  BP 130/80 (BP Location: Right Arm, Patient Position: Sitting, Cuff Size: Normal)   Pulse 69   Ht 6' (1.829 m)   Wt 195 lb (88.5 kg)   SpO2 98%   BMI 26.45 kg/m     Wt Readings from Last 3 Encounters:  06/13/19 195 lb (88.5 kg)  06/11/19 198 lb 6.4 oz (90 kg)  05/25/19 191 lb 9.3 oz (86.9 kg)     GEN: Well nourished, well developed in no acute distress HEENT: Normal NECK: No JVD; No carotid  bruits LYMPHATICS: No lymphadenopathy CARDIAC: S1S2 noted,RRR, no murmurs, rubs, gallops RESPIRATORY:  Clear to auscultation without rales, wheezing or rhonchi  ABDOMEN: Soft, non-tender, non-distended, +bowel sounds, no guarding. EXTREMITIES: No edema, No cyanosis, no clubbing MUSCULOSKELETAL:  No deformity  SKIN: Warm and dry NEUROLOGIC:  Alert and oriented x 3, non-focal PSYCHIATRIC:  Normal affect, good insight  ASSESSMENT:    1. Essential hypertension   2. TIA (transient ischemic attack)   3. Stage 3a chronic kidney disease   4. Depressed left ventricular ejection fraction   5. Tobacco use    PLAN:     1.  Today visit was with heavily on educating the patient on the importance of primary prevention as well as continuing medical care.  I explained to him the importance of taking his medication as prescribed.  The treatments of tobacco use as well as alcohol use as he tells me he does both.  All of his questions in regards to his medications were answered during his visit.  2.  We will continue the patient on his current antihypertensive medication.  Which includes Norvasc 10 mg daily, carvedilol 25 mg twice a day, clonidine 0.2 mg twice a day, hydralazine 25 mg 3 times a day.  He just recently bought a blood pressure cuff is going to take his blood pressure daily and he will bring this information at his next office visit.  3.  Recent TIA he is going to continue on aspirin and his Lipitor.  4.  Stage III kidney disease-blood work will be done today for electrolytes and renal function.  I have also referred the patient to nephrology.  5.  Mildly depressed ejection fraction have educated patient about his new diagnosis and how his compliance may affect his overall outcome.  6.  Tobacco use-the patient was counseled on tobacco cessation today for 5 minutes.  Counseling included reviewing the risks of smoking tobacco products, how it impacts the patient's current medical diagnoses and  different strategies for quitting.  Pharmacotherapy to aid in tobacco cessation was not prescribed today. The patient coordinate with  primary care provider.  The patient was also advised to call  1-800-QUIT-NOW 419-313-0401) for additional help with quitting smoking.   The patient is in agreement with the above plan. The patient left the office in stable condition.  The patient will follow up in 1 month or sooner if needed.   Medication Adjustments/Labs and Tests Ordered: Current medicines are reviewed at length with the patient  today.  Concerns regarding medicines are outlined above.  Orders Placed This Encounter  Procedures  . Basic Metabolic Panel (BMET)  . Magnesium  . EKG 12-Lead   No orders of the defined types were placed in this encounter.   Patient Instructions  Medication Instructions:  No medication changes *If you need a refill on your cardiac medications before your next appointment, please call your pharmacy*   Lab Work: Your physician recommends that you have labs done today. We will do a BMET and Magnesium If you have labs (blood work) drawn today and your tests are completely normal, you will receive your results only by: Marland Kitchen MyChart Message (if you have MyChart) OR . A paper copy in the mail If you have any lab test that is abnormal or we need to change your treatment, we will call you to review the results.   Testing/Procedures: None ordered   Follow-Up: At [facility], you and your health needs are our priority.  As part of our continuing mission to provide you with exceptional heart care, we have created designated Provider Care Teams.  These Care Teams include your primary Cardiologist (physician) and Advanced Practice Providers (APPs -  Physician Assistants and Nurse Practitioners) who all work together to provide you with the care you need, when you need it.  We recommend signing up for the patient portal called "MyChart".  Sign up information is  provided on this After Visit Summary.  MyChart is used to connect with patients for Virtual Visits (Telemedicine).  Patients are able to view lab/test results, encounter notes, upcoming appointments, etc.  Non-urgent messages can be sent to your provider as well.   To learn more about what you can do with MyChart, go to [email or web address].    Your next appointment:   1 month(s)  The format for your next appointment:   In Person  Provider:   Berniece Salines, DO   Other Instructions NA     Adopting a Healthy Lifestyle.  Know what a healthy weight is for you (roughly BMI <25) and aim to maintain this   Aim for 7+ servings of fruits and vegetables daily   65-80+ fluid ounces of water or unsweet tea for healthy kidneys   Limit to max 1 drink of alcohol per day; avoid smoking/tobacco   Limit animal fats in diet for cholesterol and heart health - choose grass fed whenever available   Avoid highly processed foods, and foods high in saturated/trans fats   Aim for low stress - take time to unwind and care for your mental health   Aim for 150 min of moderate intensity exercise weekly for heart health, and weights twice weekly for bone health   Aim for 7-9 hours of sleep daily   When it comes to diets, agreement about the perfect plan isnt easy to find, even among the experts. Experts at the [employer] [employer] developed an idea known as the Healthy Eating Plate. Just imagine a plate divided into logical, healthy portions.   The emphasis is on diet quality:   Load up on vegetables and fruits - one-half of your plate: Aim for color and variety, and remember that potatoes dont count.   Go for whole grains - one-quarter of your plate: Whole wheat, barley, wheat berries, quinoa, oats, brown rice, and foods made with them. If you want pasta, go with whole wheat pasta.   Protein power - one-quarter of your plate: Fish, chicken, beans, and nuts are all  healthy, versatile  protein sources. Limit red meat.   The diet, however, does go beyond the plate, offering a few other suggestions.   Use healthy plant oils, such as olive, canola, soy, corn, sunflower and peanut. Check the labels, and avoid partially hydrogenated oil, which have unhealthy trans fats.   If youre thirsty, drink water. Coffee and tea are good in moderation, but skip sugary drinks and limit milk and dairy products to one or two daily servings.   The type of carbohydrate in the diet is more important than the amount. Some sources of carbohydrates, such as vegetables, fruits, whole grains, and beans-are healthier than others.   Finally, stay active  Signed, Berniece Salines, DO  06/13/2019 4:56 PM    [facility]

## 2019-06-14 ENCOUNTER — Telehealth: Payer: Self-pay

## 2019-06-14 LAB — BASIC METABOLIC PANEL
BUN/Creatinine Ratio: 9 (ref 9–20)
BUN: 14 mg/dL (ref 6–20)
CO2: 21 mmol/L (ref 20–29)
Calcium: 9.1 mg/dL (ref 8.7–10.2)
Chloride: 104 mmol/L (ref 96–106)
Creatinine, Ser: 1.59 mg/dL — ABNORMAL HIGH (ref 0.76–1.27)
GFR calc Af Amer: 62 mL/min/{1.73_m2} (ref >59)
GFR calc non Af Amer: 54 mL/min/{1.73_m2} — ABNORMAL LOW (ref >59)
Glucose: 92 mg/dL (ref 65–99)
Potassium: 4.2 mmol/L (ref 3.5–5.2)
Sodium: 139 mmol/L (ref 134–144)

## 2019-06-14 LAB — MAGNESIUM: Magnesium: 2.2 mg/dL (ref 1.6–2.3)

## 2019-06-14 NOTE — Telephone Encounter (Signed)
Spoke with patient regarding results.  Patient verbalizes understanding and is agreeable to plan of care. Advised patient to call back with any issues or concerns.  

## 2019-06-14 NOTE — Telephone Encounter (Signed)
-----   Message from Berniece Salines, DO sent at 06/14/2019  8:14 AM EDT ----- Creatinine is improved.  Which I believe is his baseline due to his chronic kidney disease stage III.

## 2019-06-14 NOTE — Telephone Encounter (Signed)
See previous phone note for further documentation.  

## 2019-06-19 ENCOUNTER — Ambulatory Visit: Payer: Self-pay | Admitting: Medical

## 2019-06-19 DIAGNOSIS — Z0289 Encounter for other administrative examinations: Secondary | ICD-10-CM

## 2019-06-20 ENCOUNTER — Other Ambulatory Visit (INDEPENDENT_AMBULATORY_CARE_PROVIDER_SITE_OTHER): Payer: Self-pay | Admitting: Primary Care

## 2019-06-20 MED ORDER — SENNA 8.6 MG PO TABS: 1.0000 | ORAL_TABLET | Freq: Every day | ORAL | 0 refills | Status: DC

## 2019-06-20 MED ORDER — IRON (FERROUS SULFATE) 325 (65 FE) MG PO TABS: 325.0000 mg | ORAL_TABLET | Freq: Every day | ORAL | 6 refills | Status: DC

## 2019-06-21 ENCOUNTER — Telehealth (INDEPENDENT_AMBULATORY_CARE_PROVIDER_SITE_OTHER): Payer: Self-pay

## 2019-06-21 NOTE — Telephone Encounter (Signed)
Patient contacted. He verified date of birth. He is aware of Iron deficiency anemia present sent in supplement kidney function slightly declining increase water and decrease soda and ibuprofen. He verbalized understanding. Nat Christen, CMA

## 2019-06-21 NOTE — Telephone Encounter (Signed)
-----   Message from [name], NP sent at 06/20/2019  5:53 PM EDT ----- Iron deficiency anemia present sent in supplement kidney function slightly declining increase water and decrease soda and ibuprofen.

## 2019-06-27 ENCOUNTER — Ambulatory Visit: Payer: Self-pay

## 2019-06-28 ENCOUNTER — Ambulatory Visit: Payer: Self-pay

## 2019-07-12 ENCOUNTER — Other Ambulatory Visit: Payer: Self-pay

## 2019-07-12 ENCOUNTER — Ambulatory Visit (INDEPENDENT_AMBULATORY_CARE_PROVIDER_SITE_OTHER): Payer: Self-pay | Admitting: Cardiology

## 2019-07-12 ENCOUNTER — Encounter: Payer: Self-pay | Admitting: Cardiology

## 2019-07-12 VITALS — BP 142/98 | HR 68 | Ht 72.0 in | Wt 191.0 lb

## 2019-07-12 DIAGNOSIS — G459 Transient cerebral ischemic attack, unspecified: Secondary | ICD-10-CM

## 2019-07-12 DIAGNOSIS — N1831 Chronic kidney disease, stage 3a: Secondary | ICD-10-CM

## 2019-07-12 DIAGNOSIS — R0989 Other specified symptoms and signs involving the circulatory and respiratory systems: Secondary | ICD-10-CM

## 2019-07-12 DIAGNOSIS — I10 Essential (primary) hypertension: Secondary | ICD-10-CM

## 2019-07-12 NOTE — Patient Instructions (Signed)
Medication Instructions:  Your physician recommends that you continue on your current medications as directed. Please refer to the Current Medication list given to you today.  *If you need a refill on your cardiac medications before your next appointment, please call your pharmacy*   Lab Work: None.  If you have labs (blood work) drawn today and your tests are completely normal, you will receive your results only by: Marland Kitchen MyChart Message (if you have MyChart) OR . A paper copy in the mail If you have any lab test that is abnormal or we need to change your treatment, we will call you to review the results.   Testing/Procedures: None.    Follow-Up: At [facility], you and your health needs are our priority.  As part of our continuing mission to provide you with exceptional heart care, we have created designated Provider Care Teams.  These Care Teams include your primary Cardiologist (physician) and Advanced Practice Providers (APPs -  Physician Assistants and Nurse Practitioners) who all work together to provide you with the care you need, when you need it.  We recommend signing up for the patient portal called "MyChart".  Sign up information is provided on this After Visit Summary.  MyChart is used to connect with patients for Virtual Visits (Telemedicine).  Patients are able to view lab/test results, encounter notes, upcoming appointments, etc.  Non-urgent messages can be sent to your provider as well.   To learn more about what you can do with MyChart, go to [email or web address].    Your next appointment:   3 month(s)  The format for your next appointment:   In Person  Provider:   Berniece Salines, DO   Other Instructions  Please bring bp cuff to next visit.

## 2019-07-12 NOTE — Progress Notes (Signed)
Cardiology Office Note:    Date:  07/12/2019   ID:  Russell Hernandez, DOB June 13, 1980, MRN [id number]  PCP:  [name], NP  Cardiologist:  Berniece Salines, DO  Electrophysiologist:  None   Referring MD: [name], NP   Chief Complaint  Patient presents with   Follow-up    1 Month    History of Present Illness:    Russell Hernandez is a 39 y.o. male with a hx of Russell Hernandez is a 39 y.o. male with a hx of hypertension, chronic kidney disease stage III a, marijuana use, tobacco use, recent TIA presents today to establish cardiac care.  The patient was admitted at the [facility] in March for concerns for right sided numbness which time he was noted to be hypertensive.  During his hospital stay there is an echocardiogram done that showed that his EF was 45 to 50%.  The patient was asked to follow-up with cardiology.  I initially saw the patient June 13, 2019 at that time his blood pressure was elevated, at that time we discussed portance of taking his blood pressure medication as well as working on not smoking anymore. Today he tells me that he has quit smoking completely.  And he has been taking his blood pressure daily he shows me his home blood pressure records which show systolics averaging [id number] to [id number] and diastolic averaging in the 70s.  He says that he has been cutting back on salt and he is watching his diet.  Past Medical History:  Diagnosis Date   AKI (acute kidney injury) ([place]) 05/20/2019   Depressed left ventricular ejection fraction 06/13/2019   Hypertension    Hypertensive urgency 05/19/2019   Hypokalemia 05/20/2019   Stage 3a chronic kidney disease 06/13/2019   TIA (transient ischemic attack) 05/21/2019    Past Surgical History:  Procedure Laterality Date   No past surgery      Current Medications: Current Meds  Medication Sig   amLODipine (NORVASC) 10 MG tablet Take 1 tablet (10 mg total) by mouth daily.   aspirin EC 81  MG EC tablet Take 1 tablet (81 mg total) by mouth daily.   atorvastatin (LIPITOR) 20 MG tablet Take 1 tablet (20 mg total) by mouth daily at 6 PM.   carvedilol (COREG) 25 MG tablet Take 1 tablet (25 mg total) by mouth 2 (two) times daily with a meal.   cloNIDine (CATAPRES) 0.2 MG tablet Take 1 tablet (0.2 mg total) by mouth 2 (two) times daily.   hydrALAZINE (APRESOLINE) 25 MG tablet Take 3 tablets (75 mg total) by mouth every 8 (eight) hours.   Iron, Ferrous Sulfate, 325 (65 Fe) MG TABS Take 325 mg by mouth daily.   nitroGLYCERIN (NITROSTAT) 0.4 MG SL tablet Place 1 tablet (0.4 mg total) under the tongue every 5 (five) minutes as needed for chest pain.   senna (SENOKOT) 8.6 MG TABS tablet Take 1 tablet (8.6 mg total) by mouth daily.     Allergies:   Lisinopril and Onion   Social History   Socioeconomic History   Marital status: Legally Separated    Spouse name: Not on file   Number of children: Not on file   Years of education: Not on file   Highest education level: Not on file  Occupational History   Not on file  Tobacco Use   Smoking status: Former Smoker    Packs/day: 0.50    Types: Cigarettes    Quit  date: 06/06/2019    Years since quitting: 0.0   Smokeless tobacco: Never Used  Substance and Sexual Activity   Alcohol use: Yes    Comment: occ   Drug use: Yes    Types: Marijuana   Sexual activity: Not on file  Other Topics Concern   Not on file  Social History Narrative   Not on file   Social Determinants of Health   Financial Resource Strain:    Difficulty of Paying Living Expenses:   Food Insecurity:    Worried About Charity fundraiser in the Last Year:    Arboriculturist in the Last Year:   Transportation Needs:    Film/video editor (Medical):    Lack of Transportation (Non-Medical):   Physical Activity:    Days of Exercise per Week:    Minutes of Exercise per Session:   Stress:    Feeling of Stress :   Social Connections:      Frequency of Communication with Friends and Family:    Frequency of Social Gatherings with Friends and Family:    Attends Religious Services:    Active Member of Clubs or Organizations:    Attends Music therapist:    Marital Status:      Family History: The patient's family history includes Heart disease in his maternal grandmother; Hyperlipidemia in his mother; Hypertension in his maternal grandmother and mother.  ROS:   Review of Systems  Constitution: Negative for decreased appetite, fever and weight gain.  HENT: Negative for congestion, ear discharge, hoarse voice and sore throat.   Eyes: Negative for discharge, redness, vision loss in right eye and visual halos.  Cardiovascular: Negative for chest pain, dyspnea on exertion, leg swelling, orthopnea and palpitations.  Respiratory: Negative for cough, hemoptysis, shortness of breath and snoring.   Endocrine: Negative for heat intolerance and polyphagia.  Hematologic/Lymphatic: Negative for bleeding problem. Does not bruise/bleed easily.  Skin: Negative for flushing, nail changes, rash and suspicious lesions.  Musculoskeletal: Negative for arthritis, joint pain, muscle cramps, myalgias, neck pain and stiffness.  Gastrointestinal: Negative for abdominal pain, bowel incontinence, diarrhea and excessive appetite.  Genitourinary: Negative for decreased libido, genital sores and incomplete emptying.  Neurological: Negative for brief paralysis, focal weakness, headaches and loss of balance.  Psychiatric/Behavioral: Negative for altered mental status, depression and suicidal ideas.  Allergic/Immunologic: Negative for HIV exposure and persistent infections.    EKGs/Labs/Other Studies Reviewed:    The following studies were reviewed today:   EKG:  The ekg ordered today demonstrates    Transthoracic echocardiogram IMPRESSIONS 05/20/19 1. Mild global reduction in LV systolic function; moderate LVH; grade 1   diastolic dysfunction.  2. Left ventricular ejection fraction, by estimation, is 45 to 50%. The  left ventricle has mildly decreased function. The left ventricle  demonstrates global hypokinesis. There is moderate left ventricular  hypertrophy. Left ventricular diastolic  parameters are consistent with Grade I diastolic dysfunction (impaired  relaxation).  3. Right ventricular systolic function is normal. The right ventricular  size is normal.  4. The mitral valve is normal in structure. Trivial mitral valve  regurgitation. No evidence of mitral stenosis.  5. The aortic valve is normal in structure. Aortic valve regurgitation is  not visualized. No aortic stenosis is present.  6. The inferior vena cava is normal in size with greater than 50%  respiratory variability, suggesting right atrial pressure of 3 mmHg.    Renal US Right: Normal size right kidney. No evidence  of right renal artery     stenosis. Normal right Resisitive Index. RRV flow present.  Left: Normal size of left kidney. No evidence of left renal artery     stenosis. Normal left Resistive Index. LRV flow present.  Mesenteric:  Normal Celiac artery and Superior Mesenteric artery findings.   CT head IMPRESSION:05/19/19 1. No intracranial abnormality. 2. Marked chronic left maxillary sinusitis.   MRI IMPRESSION: 05/20/19 1. No acute finding. 2. Mild white matter disease with lacunes and intracranial vascular tortuosity correlating with history of chronic hypertension. 3. Chronic sinusitis, most notable at the left maxillary sinus where there is an associated middle meatus and nasal cavity polyp. Recommend elective ENT referral.   Recent Labs: 05/20/2019: B Natriuretic Peptide 99.6; TSH 1.894 06/11/2019: ALT 10; Hemoglobin 12.3; Platelets 504 06/13/2019: BUN 14; Creatinine, Ser 1.59; Magnesium 2.2; Potassium 4.2; Sodium 139  Recent Lipid Panel    Component Value Date/Time   CHOL 298 (H) 05/20/2019  0627   TRIG 173 (H) 05/20/2019 0627   HDL 31 (L) 05/20/2019 0627   CHOLHDL 9.6 05/20/2019 0627   VLDL 35 05/20/2019 0627   LDLCALC 232 (H) 05/20/2019 0627    Physical Exam:    VS:  BP (!) 142/98    Pulse 68    Ht 6' (1.829 m)    Wt 191 lb (86.6 kg)    SpO2 97%    BMI 25.90 kg/m     Wt Readings from Last 3 Encounters:  07/12/19 191 lb (86.6 kg)  06/13/19 195 lb (88.5 kg)  06/11/19 198 lb 6.4 oz (90 kg)     GEN: Well nourished, well developed in no acute distress HEENT: Normal NECK: No JVD; No carotid bruits LYMPHATICS: No lymphadenopathy CARDIAC: S1S2 noted,RRR, no murmurs, rubs, gallops RESPIRATORY:  Clear to auscultation without rales, wheezing or rhonchi  ABDOMEN: Soft, non-tender, non-distended, +bowel sounds, no guarding. EXTREMITIES: No edema, No cyanosis, no clubbing MUSCULOSKELETAL:  No deformity  SKIN: Warm and dry, generalized tattoos NEUROLOGIC:  Alert and oriented x 3, non-focal PSYCHIATRIC:  Normal affect, good insight  ASSESSMENT:    1. TIA (transient ischemic attack)   2. Essential hypertension   3. Stage 3a chronic kidney disease   4. Depressed left ventricular ejection fraction    PLAN:      His blood pressure manually today for me 140/92 mmHg.  He showed me his daily blood pressure records from home since his last visit which showed that he diligently takes his blood pressure daily and the systolic seems to be averaging 120s to Q000111Q and his diastolic averaging in the 70s.  At this time I really would overweight increasing his medication based on his manual blood pressure here there may be a factor of some anxiety today as well as whitecoat hypertension.  I asked him to bring his blood pressure cuff at his next visit so we can be able to compare his manual blood pressure with what his cuff will show [place].  In the meantime I have educated the patient to stay on his current medication regimen which includes carvedilol 25 mg twice a day, clonidine 0.2 mg twice  daily, hydralazine 25 mg 3 times daily and Lopid 10 mg daily.  I explained to the patient about the rebound hypertension phenomena clonidine and that he should to his possible best not to miss this medication.  Mild depressed EF-continue patient on his beta-blocker.  Chronic kidney disease-avoid nephrotoxins.  For now he is off diuretics and lisinopril.  Hyperlipidemia  continue atorvastatin  History of TIA continue aspirin and Lipitor  The patient is in agreement with the above plan. The patient left the office in stable condition.  The patient will follow up in 3 months or sooner if needed.  Medication Adjustments/Labs and Tests Ordered: Current medicines are reviewed at length with the patient today.  Concerns regarding medicines are outlined above.  No orders of the defined types were placed in this encounter.  No orders of the defined types were placed in this encounter.   There are no Patient Instructions on file for this visit.   Adopting a Healthy Lifestyle.  Know what a healthy weight is for you (roughly BMI <25) and aim to maintain this   Aim for 7+ servings of fruits and vegetables daily   65-80+ fluid ounces of water or unsweet tea for healthy kidneys   Limit to max 1 drink of alcohol per day; avoid smoking/tobacco   Limit animal fats in diet for cholesterol and heart health - choose grass fed whenever available   Avoid highly processed foods, and foods high in saturated/trans fats   Aim for low stress - take time to unwind and care for your mental health   Aim for 150 min of moderate intensity exercise weekly for heart health, and weights twice weekly for bone health   Aim for 7-9 hours of sleep daily   When it comes to diets, agreement about the perfect plan isnt easy to find, even among the experts. Experts at the [employer] developed an idea known as the Healthy Eating Plate. Just imagine a plate divided into logical, healthy portions.     The emphasis is on diet quality:   Load up on vegetables and fruits - one-half of your plate: Aim for color and variety, and remember that potatoes dont count.   Go for whole grains - one-quarter of your plate: Whole wheat, barley, wheat berries, quinoa, oats, brown rice, and foods made with them. If you want pasta, go with whole wheat pasta.   Protein power - one-quarter of your plate: Fish, chicken, beans, and nuts are all healthy, versatile protein sources. Limit red meat.   The diet, however, does go beyond the plate, offering a few other suggestions.   Use healthy plant oils, such as olive, canola, soy, corn, sunflower and peanut. Check the labels, and avoid partially hydrogenated oil, which have unhealthy trans fats.   If youre thirsty, drink water. Coffee and tea are good in moderation, but skip sugary drinks and limit milk and dairy products to one or two daily servings.   The type of carbohydrate in the diet is more important than the amount. Some sources of carbohydrates, such as vegetables, fruits, whole grains, and beans-are healthier than others.   Finally, stay active  Signed, Berniece Salines, DO  07/12/2019 9:20 AM    [place]

## 2019-08-01 ENCOUNTER — Emergency Department (HOSPITAL_BASED_OUTPATIENT_CLINIC_OR_DEPARTMENT_OTHER)
Admission: EM | Admit: 2019-08-01 | Discharge: 2019-08-01 | Disposition: A | Discharge status: Home / Self Care | Payer: Self-pay | Attending: Emergency Medicine | Admitting: Emergency Medicine

## 2019-08-01 ENCOUNTER — Encounter (HOSPITAL_BASED_OUTPATIENT_CLINIC_OR_DEPARTMENT_OTHER): Payer: Self-pay

## 2019-08-01 ENCOUNTER — Other Ambulatory Visit: Payer: Self-pay

## 2019-08-01 DIAGNOSIS — Z8673 Personal history of transient ischemic attack (TIA), and cerebral infarction without residual deficits: Secondary | ICD-10-CM | POA: Insufficient documentation

## 2019-08-01 DIAGNOSIS — Z91018 Allergy to other foods: Secondary | ICD-10-CM | POA: Insufficient documentation

## 2019-08-01 DIAGNOSIS — Z79899 Other long term (current) drug therapy: Secondary | ICD-10-CM | POA: Insufficient documentation

## 2019-08-01 DIAGNOSIS — Z888 Allergy status to other drugs, medicaments and biological substances status: Secondary | ICD-10-CM | POA: Insufficient documentation

## 2019-08-01 DIAGNOSIS — I129 Hypertensive chronic kidney disease with stage 1 through stage 4 chronic kidney disease, or unspecified chronic kidney disease: Secondary | ICD-10-CM | POA: Insufficient documentation

## 2019-08-01 DIAGNOSIS — J019 Acute sinusitis, unspecified: Secondary | ICD-10-CM | POA: Insufficient documentation

## 2019-08-01 DIAGNOSIS — N183 Chronic kidney disease, stage 3 unspecified: Secondary | ICD-10-CM | POA: Insufficient documentation

## 2019-08-01 DIAGNOSIS — Z87891 Personal history of nicotine dependence: Secondary | ICD-10-CM | POA: Insufficient documentation

## 2019-08-01 DIAGNOSIS — Z7982 Long term (current) use of aspirin: Secondary | ICD-10-CM | POA: Insufficient documentation

## 2019-08-01 MED ORDER — CETIRIZINE HCL 10 MG PO TABS: 10.0000 mg | ORAL_TABLET | Freq: Every day | ORAL | 0 refills | Status: DC

## 2019-08-01 MED ORDER — AMOXICILLIN-POT CLAVULANATE 875-125 MG PO TABS: 1.0000 | ORAL_TABLET | Freq: Two times a day (BID) | ORAL | 0 refills | Status: AC

## 2019-08-01 MED ORDER — FLUTICASONE PROPIONATE 50 MCG/ACT NA SUSP
2.0000 | Freq: Every day | NASAL | 0 refills | Status: DC
Start: 2019-08-01 — End: 2019-10-02

## 2019-08-01 NOTE — ED Provider Notes (Signed)
[facility] EMERGENCY DEPARTMENT Provider Note   CSN: [id number] Arrival date & time: 08/01/19  1115     History Chief Complaint  Patient presents with  . URI    Russell Hernandez is a 39 y.o. male.  HPI      Congestion, clear runny nose Almost 2 weeks Increasing pressure on sinuses No fever No sore throat No cough No ear pain Nasal spray Claritin Not helping   Past Medical History:  Diagnosis Date  . AKI (acute kidney injury) ([place]) 05/20/2019  . Depressed left ventricular ejection fraction 06/13/2019  . Hypertension   . Hypertensive urgency 05/19/2019  . Hypokalemia 05/20/2019  . Stage 3a chronic kidney disease 06/13/2019  . TIA (transient ischemic attack) 05/21/2019    Patient Active Problem List   Diagnosis Date Noted  . Stage 3a chronic kidney disease 06/13/2019  . Depressed left ventricular ejection fraction 06/13/2019  . Hypertension   . Hypokalemia 05/20/2019  . AKI (acute kidney injury) ([place]) 05/20/2019  . TIA (transient ischemic attack)   . Hypertensive urgency 05/19/2019    Past Surgical History:  Procedure Laterality Date  . No past surgery         Family History  Problem Relation Age of Onset  . Hypertension Mother   . Hyperlipidemia Mother   . Hypertension Maternal Grandmother   . Heart disease Maternal Grandmother     Social History   Tobacco Use  . Smoking status: Former Smoker    Packs/day: 0.50    Types: Cigarettes    Quit date: 06/06/2019    Years since quitting: 0.1  . Smokeless tobacco: Never Used  Substance Use Topics  . Alcohol use: Yes    Comment: occ  . Drug use: Yes    Types: Marijuana    Home Medications Prior to Admission medications   Medication Sig Start Date End Date Taking? Authorizing Provider  amLODipine (NORVASC) 10 MG tablet Take 1 tablet (10 mg total) by mouth daily. 06/11/19 12/08/19  [name], NP  amoxicillin-clavulanate (AUGMENTIN) 875-125 MG tablet Take 1 tablet by mouth every 12  (twelve) hours for 7 days. 08/01/19 08/08/19  Gareth Morgan, MD  aspirin EC 81 MG EC tablet Take 1 tablet (81 mg total) by mouth daily. 05/25/19   Mercy Riding, MD  atorvastatin (LIPITOR) 20 MG tablet Take 1 tablet (20 mg total) by mouth daily at 6 PM. 06/11/19   [name], NP  carvedilol (COREG) 25 MG tablet Take 1 tablet (25 mg total) by mouth 2 (two) times daily with a meal. 06/11/19   [name], NP  cetirizine (ZYRTEC ALLERGY) 10 MG tablet Take 1 tablet (10 mg total) by mouth daily. 08/01/19   Gareth Morgan, MD  cloNIDine (CATAPRES) 0.2 MG tablet Take 1 tablet (0.2 mg total) by mouth 2 (two) times daily. 06/11/19   [name], NP  fluticasone (FLONASE) 50 MCG/ACT nasal spray Place 2 sprays into both nostrils daily for 7 days. 08/01/19 08/08/19  Gareth Morgan, MD  hydrALAZINE (APRESOLINE) 25 MG tablet Take 3 tablets (75 mg total) by mouth every 8 (eight) hours. 06/11/19   [name], NP  Iron, Ferrous Sulfate, 325 (65 Fe) MG TABS Take 325 mg by mouth daily. 06/20/19   [name], NP  nitroGLYCERIN (NITROSTAT) 0.4 MG SL tablet Place 1 tablet (0.4 mg total) under the tongue every 5 (five) minutes as needed for chest pain. 06/11/19   [name], NP  senna (  SENOKOT) 8.6 MG TABS tablet Take 1 tablet (8.6 mg total) by mouth daily. 06/20/19   [name], NP  hydrochlorothiazide (HYDRODIURIL) 25 MG tablet Take 1 tablet (25 mg total) by mouth daily. 12/10/16 02/05/19  [name], MD  lisinopril (PRINIVIL,ZESTRIL) 10 MG tablet Take 1 tablet (10 mg total) by mouth daily. 12/10/16 02/05/19  [name], MD  omeprazole (PRILOSEC) 20 MG capsule Take 1 capsule (20 mg total) by mouth daily. 12/11/16 02/05/19  Doristine Devoid, PA-C  ranitidine (ZANTAC) 150 MG tablet Take 1 tablet (150 mg total) by mouth 2 (two) times daily. 12/13/16 02/05/19  [name], [name], MD    Allergies    Lisinopril and Onion  Review of Systems   Review of Systems    Constitutional: Negative for fever.  HENT: Positive for congestion and sinus pain. Negative for sore throat.   Eyes: Negative for visual disturbance.  Respiratory: Negative for cough and shortness of breath.   Cardiovascular: Negative for chest pain.  Gastrointestinal: Negative for abdominal pain, diarrhea, nausea and vomiting.  Genitourinary: Negative for difficulty urinating.  Musculoskeletal: Negative for back pain and neck stiffness.  Skin: Negative for rash.  Neurological: Negative for syncope and headaches.    Physical Exam Updated Vital Signs BP 133/89 (BP Location: Right Arm)   Pulse 67   Temp 97.9 F (36.6 C) (Oral)   Resp 18   Ht 6' (1.829 m)   Wt 89 kg   SpO2 99%   BMI 26.60 kg/m   Physical Exam Vitals and nursing note reviewed.  Constitutional:      General: He is not in acute distress.    Appearance: Normal appearance. He is not ill-appearing, toxic-appearing or diaphoretic.  HENT:     Head: Normocephalic.  Eyes:     Conjunctiva/sclera: Conjunctivae normal.  Cardiovascular:     Rate and Rhythm: Normal rate and regular rhythm.     Pulses: Normal pulses.  Pulmonary:     Effort: Pulmonary effort is normal. No respiratory distress.  Musculoskeletal:        General: No deformity or signs of injury.     Cervical back: No rigidity.  Skin:    General: Skin is warm and dry.     Coloration: Skin is not jaundiced or pale.  Neurological:     General: No focal deficit present.     Mental Status: He is alert and oriented to person, place, and time.     ED Results / Procedures / Treatments   Labs (all labs ordered are listed, but only abnormal results are displayed) Labs Reviewed - No data to display  EKG None  Radiology No results found.  Procedures Procedures (including critical care time)  Medications Ordered in ED Medications - No data to display  ED Course  I have reviewed the triage vital signs and the nursing notes.  Pertinent labs &  imaging results that were available during my care of the patient were reviewed by me and considered in my medical decision making (see chart for details).    MDM Rules/Calculators/A&P                      39yo male presents with concern for congestion.  Declines COVID testing. Possible viral etiology, allergies, however given duration of symptoms suspect superimposed bacterial sinusitis.  Given rx for augmentin, cetirizine and flonase. Patient discharged in stable condition with understanding of reasons to return.   Final Clinical Impression(s) / ED Diagnoses Final diagnoses:  Acute non-recurrent sinusitis, unspecified location    Rx / DC Orders ED Discharge Orders         Ordered    cetirizine (ZYRTEC ALLERGY) 10 MG tablet  Daily     08/01/19 1152    amoxicillin-clavulanate (AUGMENTIN) 875-125 MG tablet  Every 12 hours     08/01/19 1152    fluticasone (FLONASE) 50 MCG/ACT nasal spray  Daily     08/01/19 1152           Gareth Morgan, MD 08/02/19 2149

## 2019-08-01 NOTE — ED Triage Notes (Signed)
C/o "allergies"-c/o runny nose, sinus pressure x 1 week-NAD-steady gait

## 2019-08-13 ENCOUNTER — Ambulatory Visit (INDEPENDENT_AMBULATORY_CARE_PROVIDER_SITE_OTHER): Payer: Self-pay | Admitting: Primary Care

## 2019-08-17 ENCOUNTER — Encounter (HOSPITAL_BASED_OUTPATIENT_CLINIC_OR_DEPARTMENT_OTHER): Payer: Self-pay | Admitting: Emergency Medicine

## 2019-08-17 ENCOUNTER — Other Ambulatory Visit: Payer: Self-pay

## 2019-08-17 ENCOUNTER — Emergency Department (HOSPITAL_BASED_OUTPATIENT_CLINIC_OR_DEPARTMENT_OTHER)
Admission: EM | Admit: 2019-08-17 | Discharge: 2019-08-17 | Disposition: A | Discharge status: Home / Self Care | Payer: Self-pay | Attending: Emergency Medicine | Admitting: Emergency Medicine

## 2019-08-17 DIAGNOSIS — K029 Dental caries, unspecified: Secondary | ICD-10-CM | POA: Insufficient documentation

## 2019-08-17 MED ORDER — CHLORHEXIDINE GLUCONATE 0.12 % MT SOLN
15.0000 mL | Freq: Two times a day (BID) | OROMUCOSAL | 0 refills | Status: DC
Start: 2019-08-17 — End: 2019-11-29

## 2019-08-17 MED ORDER — OXYCODONE-ACETAMINOPHEN 5-325 MG PO TABS: 1.0000 | ORAL_TABLET | ORAL | 0 refills | Status: DC | PRN

## 2019-08-17 MED ORDER — BENZOCAINE 10 % MT GEL: 1.0000 "application " | OROMUCOSAL | 0 refills | Status: DC | PRN

## 2019-08-17 MED ORDER — PENICILLIN V POTASSIUM 500 MG PO TABS
500.0000 mg | ORAL_TABLET | Freq: Four times a day (QID) | ORAL | 0 refills | Status: AC
Start: 2019-08-17 — End: 2019-08-24

## 2019-08-17 MED ORDER — PENICILLIN V POTASSIUM 250 MG PO TABS
500.0000 mg | ORAL_TABLET | Freq: Once | ORAL | Status: AC
  Administered 2019-08-17: 500 mg via ORAL
  Filled 2019-08-17: qty 2

## 2019-08-17 NOTE — Discharge Instructions (Signed)
At this time there does not appear to be the presence of an emergent medical condition, however there is always the potential for conditions to change. Please read and follow the below instructions.  Please return to the Emergency Department immediately for any new or worsening symptoms. Please be sure to follow up with your Primary Care Provider within one week regarding your visit today; please call their office to schedule an appointment even if you are feeling better for a follow-up visit. You may use the pain medication Percocet (Oxycodone/Acetaminophen) as prescribed for severe pain.  Percocet will make you drowsy so do not drive, drink alcohol, take other sedating medications or perform any dangerous activities such as driving after taking Percocet.  Percocet contains Tylenol (acetaminophen) so do not take any other medications containing Tylenol with Percocet. Please take your antibiotic Penicillin as prescribed until complete to help with your symptoms.  Please drink enough water to avoid dehydration and get plenty of rest. You may use the medication Peridex as prescribed to help with your symptoms.  Rinse and spit Peridex out, do not swallow Peridex. You may use the medication Orajel as prescribed to help with your pain. Please call the dentist Dr. Mariel Sleet on your discharge paperwork to schedule an appointment for definitive dental care.  Get help right away if: You cannot open your mouth. You are having trouble breathing or swallowing. You have a fever. Your face, neck, or jaw is swollen. You have any new/concerning or worsening of symptoms  Please read the additional information packets attached to your discharge summary.  Do not take your medicine if  develop an itchy rash, swelling in your mouth or lips, or difficulty breathing; call 911 and seek immediate emergency medical attention if this occurs.  Note: Portions of this text may have been transcribed using voice recognition  software. Every effort was made to ensure accuracy; however, inadvertent computerized transcription errors may still be present.

## 2019-08-17 NOTE — ED Triage Notes (Signed)
R upper dental pain x 3 days. He has a broken tooth.

## 2019-08-17 NOTE — ED Provider Notes (Signed)
[facility]PART[facility] Provider Note   CSN: [id number] Arrival date & time: 08/17/19  1058     History Chief Complaint  Patient presents with   Dental Pain    Russell Hernandez is a 39 y.o. male history of hypertension, CKD, TIA  Patient presents today for dental pain, patient reports right upper and lower dental pain ongoing for 3 days, he feels he broke a tooth in this area chewing on his food.  He describes a severe constant throbbing ache nonradiating worsened with chewing on the right side no alleviating factors.  He has attempted Tylenol without relief of symptoms.  Denies fevers/chills, headache, vision changes, trismus, ear pain, swelling of the face/head/neck, sore throat, nausea/vomiting or any additional concerns.  HPI     Past Medical History:  Diagnosis Date   AKI (acute kidney injury) ([place]) 05/20/2019   Depressed left ventricular ejection fraction 06/13/2019   Hypertension    Hypertensive urgency 05/19/2019   Hypokalemia 05/20/2019   Stage 3a chronic kidney disease 06/13/2019   TIA (transient ischemic attack) 05/21/2019    Patient Active Problem List   Diagnosis Date Noted   Stage 3a chronic kidney disease 06/13/2019   Depressed left ventricular ejection fraction 06/13/2019   Hypertension    Hypokalemia 05/20/2019   AKI (acute kidney injury) ([place]) 05/20/2019   TIA (transient ischemic attack)    Hypertensive urgency 05/19/2019    Past Surgical History:  Procedure Laterality Date   No past surgery         Family History  Problem Relation Age of Onset   Hypertension Mother    Hyperlipidemia Mother    Hypertension Maternal Grandmother    Heart disease Maternal Grandmother     Social History   Tobacco Use   Smoking status: Former Smoker    Packs/day: 0.50    Types: Cigarettes    Quit date: 06/06/2019    Years since quitting: 0.1   Smokeless tobacco: Never Used  Substance Use Topics   Alcohol use: Yes   Comment: occ   Drug use: Yes    Types: Marijuana    Home Medications Prior to Admission medications   Medication Sig Start Date End Date Taking? Authorizing Provider  amLODipine (NORVASC) 10 MG tablet Take 1 tablet (10 mg total) by mouth daily. 06/11/19 12/08/19  [name], NP  aspirin EC 81 MG EC tablet Take 1 tablet (81 mg total) by mouth daily. 05/25/19   Mercy Riding, MD  atorvastatin (LIPITOR) 20 MG tablet Take 1 tablet (20 mg total) by mouth daily at 6 PM. 06/11/19   [name], NP  benzocaine (ORAJEL) 10 % mucosal gel Use as directed 1 application in the mouth or throat as needed for mouth pain. 08/17/19   Nuala Alpha A, PA-C  carvedilol (COREG) 25 MG tablet Take 1 tablet (25 mg total) by mouth 2 (two) times daily with a meal. 06/11/19   [name], NP  cetirizine (ZYRTEC ALLERGY) 10 MG tablet Take 1 tablet (10 mg total) by mouth daily. 08/01/19   Gareth Morgan, MD  chlorhexidine (PERIDEX) 0.12 % solution Use as directed 15 mLs in the mouth or throat 2 (two) times daily. Rinse and spit, do not swallow. 08/17/19   Nuala Alpha A, PA-C  cloNIDine (CATAPRES) 0.2 MG tablet Take 1 tablet (0.2 mg total) by mouth 2 (two) times daily. 06/11/19   [name], NP  fluticasone (FLONASE) 50 MCG/ACT nasal spray Place 2 sprays into  both nostrils daily for 7 days. 08/01/19 08/08/19  Gareth Morgan, MD  hydrALAZINE (APRESOLINE) 25 MG tablet Take 3 tablets (75 mg total) by mouth every 8 (eight) hours. 06/11/19   [name], NP  Iron, Ferrous Sulfate, 325 (65 Fe) MG TABS Take 325 mg by mouth daily. 06/20/19   [name], NP  nitroGLYCERIN (NITROSTAT) 0.4 MG SL tablet Place 1 tablet (0.4 mg total) under the tongue every 5 (five) minutes as needed for chest pain. 06/11/19   [name], NP  oxyCODONE-acetaminophen (PERCOCET/ROXICET) 5-325 MG tablet Take 1 tablet by mouth every 4 (four) hours as needed for severe pain. 08/17/19   Nuala Alpha A,  PA-C  penicillin v potassium (VEETID) 500 MG tablet Take 1 tablet (500 mg total) by mouth 4 (four) times daily for 7 days. 08/17/19 08/24/19  Deliah Boston, PA-C  senna (SENOKOT) 8.6 MG TABS tablet Take 1 tablet (8.6 mg total) by mouth daily. 06/20/19   [name], NP  hydrochlorothiazide (HYDRODIURIL) 25 MG tablet Take 1 tablet (25 mg total) by mouth daily. 12/10/16 02/05/19  [name], MD  lisinopril (PRINIVIL,ZESTRIL) 10 MG tablet Take 1 tablet (10 mg total) by mouth daily. 12/10/16 02/05/19  [name], MD  omeprazole (PRILOSEC) 20 MG capsule Take 1 capsule (20 mg total) by mouth daily. 12/11/16 02/05/19  Doristine Devoid, PA-C  ranitidine (ZANTAC) 150 MG tablet Take 1 tablet (150 mg total) by mouth 2 (two) times daily. 12/13/16 02/05/19  [name], [name], MD    Allergies    Lisinopril and Onion  Review of Systems   Review of Systems  Constitutional: Negative.  Negative for chills and fever.  HENT: Positive for dental problem. Negative for facial swelling, sore throat, trouble swallowing and voice change.   Eyes: Negative.  Negative for visual disturbance.  Gastrointestinal: Negative.  Negative for abdominal pain, nausea and vomiting.  Neurological: Negative.  Negative for headaches.    Physical Exam Updated Vital Signs BP (!) 142/91 (BP Location: Left Arm)    Pulse (!) 59    Temp 98.1 F (36.7 C) (Oral)    Resp 18    Ht 6\' 1"  (1.854 m)    Wt 90.7 kg    SpO2 97%    BMI 26.39 kg/m   Physical Exam Constitutional:      General: He is not in acute distress.    Appearance: Normal appearance. He is well-developed. He is not ill-appearing or diaphoretic.  HENT:     Head: Normocephalic and atraumatic.     Mouth/Throat:     Comments: Multiple dental caries throughout, poor dentition overall.  Tenderness to percussion of the right upper and lower molars with dental caries present. - The patient has normal phonation and is in control of secretions. No stridor.  Midline  uvula without edema. Soft palate rises symmetrically. No tonsillar erythema, swelling or exudates. Tongue protrusion is normal, floor of mouth is soft. No trismus. No creptius on neck palpation. No gingival erythema or fluctuance noted. Mucus membranes moist. Eyes:     General: Vision grossly intact. Gaze aligned appropriately.     Pupils: Pupils are equal, round, and reactive to light.  Neck:     Trachea: Trachea and phonation normal. No tracheal tenderness or tracheal deviation.  Pulmonary:     Effort: Pulmonary effort is normal. No respiratory distress.  Abdominal:     General: There is no distension.     Palpations: Abdomen is soft.  Tenderness: There is no abdominal tenderness. There is no guarding or rebound.  Musculoskeletal:        General: Normal range of motion.     Cervical back: Full passive range of motion without pain, normal range of motion and neck supple. No rigidity.  Skin:    General: Skin is warm and dry.  Neurological:     Mental Status: He is alert.     GCS: GCS eye subscore is 4. GCS verbal subscore is 5. GCS motor subscore is 6.     Comments: Speech is clear and goal oriented, follows commands Major Cranial nerves without deficit, no facial droop Moves extremities without ataxia, coordination intact  Psychiatric:        Behavior: Behavior normal.     ED Results / Procedures / Treatments   Labs (all labs ordered are listed, but only abnormal results are displayed) Labs Reviewed - No data to display  EKG None  Radiology No results found.  Procedures Procedures (including critical care time)  Medications Ordered in ED Medications  penicillin v potassium (VEETID) tablet 500 mg (has no administration in time range)    ED Course  I have reviewed the triage vital signs and the nursing notes.  Pertinent labs & imaging results that were available during my care of the patient were reviewed by me and considered in my medical decision making (see  chart for details).    MDM Rules/Calculators/A&P                      Additional History Obtained: 1. Nursing notes from this visit.  Patient presents with right upper and lower dental pain.  Multiple dental cavities noted with pain to percussion of the right upper and lower molars.  No signs or symptoms of dental abscess, no swelling/erythema/tenderness of the gums.  Patient is well-appearing, afebrile, nontoxic, speaking well.  Patient able to swallow without pain.  No signs of swelling or concern for Ludwig's angina/Peritonsilar abscess/Retropharyngeal abscess or other deep tissue infections.  No sign of swelling of the neck, patient has good range of motion of the neck, no trismus.  He has no additional concerns today.  We will treat with penicillin VK 500 mg 4 times daily x7 days and referred to dentist.  Additionally patient given chlorhexidine mouth rinse and Orajel to help with his symptoms.  PDMP reviewed patient without previous narcotic prescriptions, 2 (two) pills Percocet prescribed for acute dental pain, patient states understanding of narcotic precautions and has no questions.  At this time there does not appear to be any evidence of an acute emergency medical condition and the patient appears stable for discharge with appropriate outpatient follow up. Diagnosis was discussed with patient who verbalizes understanding of care plan and is agreeable to discharge. I have discussed return precautions with patient who verbalizes understanding. Patient encouraged to follow-up with their PCP and dentist. All questions answered.   Note: Portions of this report may have been transcribed using voice recognition software. Every effort was made to ensure accuracy; however, inadvertent computerized transcription errors may still be present. Final Clinical Impression(s) / ED Diagnoses Final diagnoses:  Pain due to dental caries    Rx / DC Orders ED Discharge Orders         Ordered     penicillin v potassium (VEETID) 500 MG tablet  4 times daily     08/17/19 1425    chlorhexidine (PERIDEX) 0.12 % solution  2 times daily  08/17/19 1425    oxyCODONE-acetaminophen (PERCOCET/ROXICET) 5-325 MG tablet  Every 4 hours PRN     08/17/19 1425    benzocaine (ORAJEL) 10 % mucosal gel  As needed     08/17/19 1425           Deliah Boston, PA-C 08/17/19 1427    Lucrezia Starch, MD 08/18/19 [phone]

## 2019-08-20 ENCOUNTER — Ambulatory Visit (INDEPENDENT_AMBULATORY_CARE_PROVIDER_SITE_OTHER): Payer: Self-pay | Admitting: Primary Care

## 2019-08-20 ENCOUNTER — Other Ambulatory Visit: Payer: Self-pay

## 2019-08-20 ENCOUNTER — Encounter (INDEPENDENT_AMBULATORY_CARE_PROVIDER_SITE_OTHER): Payer: Self-pay | Admitting: Primary Care

## 2019-08-20 VITALS — BP 131/84 | HR 54 | Temp 97.3°F | Ht 73.0 in | Wt 202.0 lb

## 2019-08-20 DIAGNOSIS — I10 Essential (primary) hypertension: Secondary | ICD-10-CM

## 2019-08-20 NOTE — Patient Instructions (Signed)
Allergic Rhinitis, Adult Allergic rhinitis is a reaction to allergens in the air. Allergens are tiny specks (particles) in the air that cause your body to have an allergic reaction. This condition cannot be passed from person to person (is not contagious). Allergic rhinitis cannot be cured, but it can be controlled. There are two types of allergic rhinitis:  Seasonal. This type is also called hay fever. It happens only during certain times of the year.  Perennial. This type can happen at any time of the year. What are the causes? This condition may be caused by:  Pollen from grasses, trees, and weeds.  House dust mites.  Pet dander.  Mold. What are the signs or symptoms? Symptoms of this condition include:  Sneezing.  Runny or stuffy nose (nasal congestion).  A lot of mucus in the back of the throat (postnasal drip).  Itchy nose.  Tearing of the eyes.  Trouble sleeping.  Being sleepy during day. How is this treated? There is no cure for this condition. You should avoid things that trigger your symptoms (allergens). Treatment can help to relieve symptoms. This may include:  Medicines that block allergy symptoms, such as antihistamines. These may be given as a shot, nasal spray, or pill.  Shots that are given until your body becomes less sensitive to the allergen (desensitization).  Stronger medicines, if all other treatments have not worked. Follow these instructions at home: Avoiding allergens   Find out what you are allergic to. Common allergens include smoke, dust, and pollen.  Avoid them if you can. These are some of the things that you can do to avoid allergens: ? Replace carpet with wood, tile, or vinyl flooring. Carpet can trap dander and dust. ? Clean any mold found in the home. ? Do not smoke. Do not allow smoking in your home. ? Change your heating and air conditioning filter at least once a month. ? During allergy season:  Keep windows closed as much as  you can. If possible, use air conditioning when there is a lot of pollen in the air.  Use a special filter for allergies with your furnace and air conditioner.  Plan outdoor activities when pollen counts are lowest. This is usually during the early morning or evening hours.  If you do go outdoors when pollen count is high, wear a special mask for people with allergies.  When you come indoors, take a shower and change your clothes before sitting on furniture or bedding. General instructions  Do not use fans in your home.  Do not hang clothes outside to dry.  Wear sunglasses to keep pollen out of your eyes.  Wash your hands right away after you touch household pets.  Take over-the-counter and prescription medicines only as told by your doctor.  Keep all follow-up visits as told by your doctor. This is important. Contact a doctor if:  You have a fever.  You have a cough that does not go away (is persistent).  You start to make whistling sounds when you breathe (wheeze).  Your symptoms do not get better with treatment.  You have thick fluid coming from your nose.  You start to have nosebleeds. Get help right away if:  Your tongue or your lips are swollen.  You have trouble breathing.  You feel dizzy or you feel like you are going to pass out (faint).  You have cold sweats. Summary  Allergic rhinitis is a reaction to allergens in the air.  This condition may be   caused by allergens. These include pollen, dust mites, pet dander, and mold.  Symptoms include a runny, itchy nose, sneezing, or tearing eyes. You may also have trouble sleeping or feel sleepy during the day.  Treatment includes taking medicines and avoiding allergens. You may also get shots or take stronger medicines.  Get help if you have a fever or a cough that does not stop. Get help right away if you are short of breath. This information is not intended to replace advice given to you by your health care  provider. Make sure you discuss any questions you have with your health care provider. Document Revised: 06/19/2018 Document Reviewed: 09/19/2017 Elsevier Patient Education  2020 [employer]  

## 2019-08-20 NOTE — Progress Notes (Signed)
Mr.Russell Hernandez presents for  hypertension evaluation, on previous visit medication was adjusted to include hydralazine 75 mg three times a day. Amlodipine 10 mg, Coreg 25mg  twice daily Patient reports adherence with medications. Current Medication List Current Outpatient Medications on File Prior to Visit  Medication Sig Dispense Refill   amLODipine (NORVASC) 10 MG tablet Take 1 tablet (10 mg total) by mouth daily. 90 tablet 1   aspirin EC 81 MG EC tablet Take 1 tablet (81 mg total) by mouth daily. 90 tablet 1   atorvastatin (LIPITOR) 20 MG tablet Take 1 tablet (20 mg total) by mouth daily at 6 PM. 90 tablet 1   benzocaine (ORAJEL) 10 % mucosal gel Use as directed 1 application in the mouth or throat as needed for mouth pain. 5.3 g 0   carvedilol (COREG) 25 MG tablet Take 1 tablet (25 mg total) by mouth 2 (two) times daily with a meal. 180 tablet 1   cetirizine (ZYRTEC ALLERGY) 10 MG tablet Take 1 tablet (10 mg total) by mouth daily. 30 tablet 0   chlorhexidine (PERIDEX) 0.12 % solution Use as directed 15 mLs in the mouth or throat 2 (two) times daily. Rinse and spit, do not swallow. 120 mL 0   cloNIDine (CATAPRES) 0.2 MG tablet Take 1 tablet (0.2 mg total) by mouth 2 (two) times daily. 180 tablet 1   hydrALAZINE (APRESOLINE) 25 MG tablet Take 3 tablets (75 mg total) by mouth every 8 (eight) hours. 270 tablet 1   Iron, Ferrous Sulfate, 325 (65 Fe) MG TABS Take 325 mg by mouth daily. 30 tablet 6   nitroGLYCERIN (NITROSTAT) 0.4 MG SL tablet Place 1 tablet (0.4 mg total) under the tongue every 5 (five) minutes as needed for chest pain. 50 tablet 3   penicillin v potassium (VEETID) 500 MG tablet Take 1 tablet (500 mg total) by mouth 4 (four) times daily for 7 days. 28 tablet 0   senna (SENOKOT) 8.6 MG TABS tablet Take 1 tablet (8.6 mg total) by mouth daily. 120 tablet 0   fluticasone (FLONASE) 50 MCG/ACT nasal spray Place 2 sprays into both nostrils daily for 7 days. 1 g 0    [DISCONTINUED] hydrochlorothiazide (HYDRODIURIL) 25 MG tablet Take 1 tablet (25 mg total) by mouth daily. 30 tablet 2   [DISCONTINUED] lisinopril (PRINIVIL,ZESTRIL) 10 MG tablet Take 1 tablet (10 mg total) by mouth daily. 30 tablet 0   [DISCONTINUED] omeprazole (PRILOSEC) 20 MG capsule Take 1 capsule (20 mg total) by mouth daily. 30 capsule 0   [DISCONTINUED] ranitidine (ZANTAC) 150 MG tablet Take 1 tablet (150 mg total) by mouth 2 (two) times daily. 60 tablet 0   No current facility-administered medications on file prior to visit.   Past Medical History  Past Medical History:  Diagnosis Date   AKI (acute kidney injury) ([place]) 05/20/2019   Depressed left ventricular ejection fraction 06/13/2019   Hypertension    Hypertensive urgency 05/19/2019   Hypokalemia 05/20/2019   Stage 3a chronic kidney disease 06/13/2019   TIA (transient ischemic attack) 05/21/2019   Dietary habits include: low sodium diet  Exercise habits include: walking daily  Family / Social history: No ASCVD risk factors include- [place]  O:  BP 131/84 (BP Location: Right Arm, Patient Position: Sitting, Cuff Size: Normal)    Pulse (!) 54    Temp (!) 97.3 F (36.3 C) (Temporal)    Ht 6\' 1"  (1.854 m)    Wt 202 lb (91.6 kg)    SpO2 95%  BMI 26.65 kg/m    Physical Exam General: Vital signs reviewed.  Patient is well-developed and well-nourished, in no acute distress and cooperative with exam.  Head: Normocephalic and atraumatic. Eyes: EOMI, conjunctivae normal, no scleral icterus.  Neck: Supple, trachea midline, normal ROM, no JVD, masses, thyromegaly, or carotid bruit present.  Cardiovascular: RRR, S1 normal, S2 normal, no murmurs, gallops, or rubs. Pulmonary/Chest: Clear to auscultation bilaterally, no wheezes, rales, or rhonchi. Abdominal: Soft, non-tender, non-distended, BS +, no masses, organomegaly, or guarding present.  Musculoskeletal: No joint deformities, erythema, or stiffness, ROM full and  nontender. Extremities: No lower extremity edema bilaterally,  pulses symmetric and intact bilaterally. No cyanosis or clubbing. Neurological: A&O x3, Strength is normal and symmetric bilaterally,  Skin: Warm, dry and intact. No rashes or erythema. Psychiatric: Normal mood and affect. speech and behavior is normal. Cognition and memory are normal.  Review of Systems  HENT: Positive for congestion.   Eyes:       Watery eyes   Respiratory: Positive for cough.   Endo/Heme/Allergies:       Seasonal allergies  All other systems reviewed and are negative.   Last 3 Office BP readings: BP Readings from Last 3 Encounters:  08/20/19 131/84  08/17/19 (!) 189/69  08/01/19 133/89    BMET    Component Value Date/Time   NA 139 06/13/2019 1632   K 4.2 06/13/2019 1632   CL 104 06/13/2019 1632   CO2 21 06/13/2019 1632   GLUCOSE 92 06/13/2019 1632   GLUCOSE 111 (H) 05/25/2019 0235   BUN 14 06/13/2019 1632   CREATININE 1.59 (H) 06/13/2019 1632   CALCIUM 9.1 06/13/2019 1632   GFRNONAA 54 (L) 06/13/2019 1632   GFRAA 62 06/13/2019 1632    Renal function: CrCl cannot be calculated (Patient's most recent lab result is older than the maximum 21 days allowed.).  Clinical ASCVD: No  The ASCVD Risk score [name] DC Jr., et al., 2013) failed to calculate for the following reasons:   The 2013 ASCVD risk score is only valid for ages 15 to 58   A/P: Essential hypertension Hypertension longstanding/ currently 130/80 on current medications hydralazine 75 mg three times, . BP Goal  MmHg . Patient is adherent with current medications.  -Continued  hydralazine 75 mg three times a day. Amlodipine 10 mg, Coreg 25mg  twice daily -F/u labs ordered -  -Counseled on lifestyle modifications for blood pressure control including reduced dietary sodium, increased exercise, adequate sleep

## 2019-10-02 ENCOUNTER — Telehealth (INDEPENDENT_AMBULATORY_CARE_PROVIDER_SITE_OTHER): Payer: Self-pay | Admitting: Primary Care

## 2019-10-02 ENCOUNTER — Other Ambulatory Visit: Payer: Self-pay

## 2019-10-02 ENCOUNTER — Encounter (HOSPITAL_BASED_OUTPATIENT_CLINIC_OR_DEPARTMENT_OTHER): Payer: Self-pay

## 2019-10-02 ENCOUNTER — Ambulatory Visit (INDEPENDENT_AMBULATORY_CARE_PROVIDER_SITE_OTHER): Payer: Self-pay

## 2019-10-02 ENCOUNTER — Emergency Department (HOSPITAL_BASED_OUTPATIENT_CLINIC_OR_DEPARTMENT_OTHER)
Admission: EM | Admit: 2019-10-02 | Discharge: 2019-10-02 | Disposition: A | Discharge status: Home / Self Care | Payer: Self-pay | Attending: Emergency Medicine | Admitting: Emergency Medicine

## 2019-10-02 ENCOUNTER — Emergency Department (HOSPITAL_BASED_OUTPATIENT_CLINIC_OR_DEPARTMENT_OTHER): Payer: Self-pay

## 2019-10-02 DIAGNOSIS — I10 Essential (primary) hypertension: Secondary | ICD-10-CM

## 2019-10-02 DIAGNOSIS — R0981 Nasal congestion: Secondary | ICD-10-CM | POA: Insufficient documentation

## 2019-10-02 DIAGNOSIS — N183 Chronic kidney disease, stage 3 unspecified: Secondary | ICD-10-CM | POA: Insufficient documentation

## 2019-10-02 DIAGNOSIS — I129 Hypertensive chronic kidney disease with stage 1 through stage 4 chronic kidney disease, or unspecified chronic kidney disease: Secondary | ICD-10-CM | POA: Insufficient documentation

## 2019-10-02 DIAGNOSIS — Z87891 Personal history of nicotine dependence: Secondary | ICD-10-CM | POA: Insufficient documentation

## 2019-10-02 LAB — CBC WITH DIFFERENTIAL/PLATELET
Abs Immature Granulocytes: 0.02 10*3/uL (ref 0.00–0.07)
Basophils Absolute: 0.1 10*3/uL (ref 0.0–0.1)
Basophils Relative: 1 %
Eosinophils Absolute: 0.5 10*3/uL (ref 0.0–0.5)
Eosinophils Relative: 5 %
HCT: 43.6 % (ref 39.0–52.0)
Hemoglobin: 14.4 g/dL (ref 13.0–17.0)
Immature Granulocytes: 0 %
Lymphocytes Relative: 24 %
Lymphs Abs: 2.4 10*3/uL (ref 0.7–4.0)
MCH: 27.9 pg (ref 26.0–34.0)
MCHC: 33 g/dL (ref 30.0–36.0)
MCV: 84.5 fL (ref 80.0–100.0)
Monocytes Absolute: 0.9 10*3/uL (ref 0.1–1.0)
Monocytes Relative: 9 %
Neutro Abs: 6 10*3/uL (ref 1.7–7.7)
Neutrophils Relative %: 61 %
Platelets: 399 10*3/uL (ref 150–400)
RBC: 5.16 MIL/uL (ref 4.22–5.81)
RDW: 16.3 % — ABNORMAL HIGH (ref 11.5–15.5)
WBC: 9.9 10*3/uL (ref 4.0–10.5)
nRBC: 0 % (ref 0.0–0.2)

## 2019-10-02 LAB — BASIC METABOLIC PANEL
Anion gap: 14 (ref 5–15)
BUN: 19 mg/dL (ref 6–20)
CO2: 23 mmol/L (ref 22–32)
Calcium: 9.3 mg/dL (ref 8.9–10.3)
Chloride: 98 mmol/L (ref 98–111)
Creatinine, Ser: 1.9 mg/dL — ABNORMAL HIGH (ref 0.61–1.24)
GFR calc Af Amer: 50 mL/min — ABNORMAL LOW (ref >60)
GFR calc non Af Amer: 43 mL/min — ABNORMAL LOW (ref >60)
Glucose, Bld: 115 mg/dL — ABNORMAL HIGH (ref 70–99)
Potassium: 3.2 mmol/L — ABNORMAL LOW (ref 3.5–5.1)
Sodium: 135 mmol/L (ref 135–145)

## 2019-10-02 LAB — TROPONIN I (HIGH SENSITIVITY): Troponin I (High Sensitivity): 9 ng/L (ref <18)

## 2019-10-02 MED ORDER — FLUTICASONE PROPIONATE 50 MCG/ACT NA SUSP: 2.0000 | Freq: Every day | NASAL | 0 refills | Status: DC

## 2019-10-02 NOTE — Telephone Encounter (Signed)
Patient will call back after UC visit.

## 2019-10-02 NOTE — Telephone Encounter (Signed)
Patient was triaged for hypertension Will go to UC for evaluation tonight and will call for follow up in office appointment tomorrow. No appointment available within triage disposition 24 hours.

## 2019-10-02 NOTE — Telephone Encounter (Signed)
  Called patient and he states that his BP is running high  Today 170/100 and 180/107.  He states that he has doubled medication trying to get his BP down. Patient was advised to take only as prescribe. He states he has no other symptoms No headache, blurry vision, weakness. He just does not like his BP so high.  S/S of stroke reviewed Care advice read to patient. No appointment at office until 7/28. Patient was advised to seek care at [facility] today. Call back for follow up appointment after being seen at [facility]. He agreed with plan. Reason for Disposition . Systolic BP  >= 193 OR Diastolic >= 790  Answer Assessment - Initial Assessment Questions 1. BLOOD PRESSURE: "What is the blood pressure?" "Did you take at least two measurements 5 minutes apart?"     170/100, 180/107 2. ONSET: "When did you take your blood pressure?"    About 1 week 3. HOW: "How did you obtain the blood pressure?" (e.g., visiting nurse, automatic home BP monitor)     Home monitor 4. HISTORY: "Do you have a history of high blood pressure?"     yes 5. MEDICATIONS: "Are you taking any medications for blood pressure?" "Have you missed any doses recently?"    States doubling up medication  6. OTHER SYMPTOMS: "Do you have any symptoms?" (e.g., headache, chest pain, blurred vision, difficulty breathing, weakness)    no 7. PREGNANCY: "Is there any chance you are pregnant?" "When was your last menstrual period?"     N/A  Protocols used: BLOOD PRESSURE - HIGH-A-AH

## 2019-10-02 NOTE — Discharge Instructions (Signed)
You were seen in the emergency room today with elevated blood pressures.  Please continue your blood pressure medications and follow closely with your primary care doctor.  If feeling new or worsening symptoms please return to the emergency department.  I have filled a nasal spray prescription which she can use in addition to saline nasal spray for your congestion.  Please avoid daytime cold and flu medicines as these can raise your blood pressure significantly.  You can go to the pharmacy and asked the pharmacist appoint you the direction of cold and flu medicines safe to take with elevated blood pressure.

## 2019-10-02 NOTE — ED Triage Notes (Signed)
Pt c/o elevated BP x 4-5 days-denies pain-NAD-steady gait

## 2019-10-02 NOTE — Telephone Encounter (Signed)
Patient states blood pressure medication isnt working as it should. States blood pressure had been fluctuating for about a week.

## 2019-10-02 NOTE — Telephone Encounter (Signed)
Routed to PCP. Please advice patient.

## 2019-10-02 NOTE — ED Provider Notes (Signed)
Emergency Department Provider Note   I have reviewed the triage vital signs and the nursing notes.   HISTORY  Chief Complaint Hypertension   HPI Russell Hernandez is a 39 y.o. male with past medical history including prior TIA, CKD, poorly controlled hypertension presents to the emergency department with elevated blood pressures at home.  Patient has had sinus congestion symptoms and has been taking over-the-counter decongestants and daytime cold/flu medications.  He has been compliant with his medications but has noted worsening BP. He is not experiencing symptoms with his elevated BP such as CP, SOB, HA, blurry vision, weakness/numbness.   Past Medical History:  Diagnosis Date  . AKI (acute kidney injury) ([place]) 05/20/2019  . Depressed left ventricular ejection fraction 06/13/2019  . Hypertension   . Hypertensive urgency 05/19/2019  . Hypokalemia 05/20/2019  . Stage 3a chronic kidney disease 06/13/2019  . TIA (transient ischemic attack) 05/21/2019    Patient Active Problem List   Diagnosis Date Noted  . Stage 3a chronic kidney disease 06/13/2019  . Depressed left ventricular ejection fraction 06/13/2019  . Hypertension   . Hypokalemia 05/20/2019  . AKI (acute kidney injury) ([place]) 05/20/2019  . TIA (transient ischemic attack)   . Hypertensive urgency 05/19/2019    Past Surgical History:  Procedure Laterality Date  . No past surgery      Allergies Lisinopril and Onion  Family History  Problem Relation Age of Onset  . Hypertension Mother   . Hyperlipidemia Mother   . Hypertension Maternal Grandmother   . Heart disease Maternal Grandmother     Social History Social History   Tobacco Use  . Smoking status: Former Smoker    Packs/day: 0.50    Types: Cigarettes    Quit date: 06/06/2019    Years since quitting: 0.3  . Smokeless tobacco: Never Used  Vaping Use  . Vaping Use: Never used  Substance Use Topics  . Alcohol use: Yes    Comment: occ  . Drug use: Yes     Types: Marijuana    Review of Systems  Constitutional: No fever/chills Eyes: No visual changes. ENT: No sore throat. Positive nasal congestion.  Cardiovascular: Denies chest pain. Elevated BP.  Respiratory: Denies shortness of breath. Gastrointestinal: No abdominal pain.  No nausea, no vomiting.  No diarrhea.  No constipation. Genitourinary: Negative for dysuria. Musculoskeletal: Negative for back pain. Skin: Negative for rash. Neurological: Negative for headaches, focal weakness or numbness.  10-point ROS otherwise negative.  ____________________________________________   PHYSICAL EXAM:  VITAL SIGNS: ED Triage Vitals  Enc Vitals Group     BP 10/02/19 1751 (!) 188/116     Pulse Rate 10/02/19 1751 63     Resp 10/02/19 1751 18     Temp 10/02/19 1751 98 F (36.7 C)     Temp Source 10/02/19 1751 Oral     SpO2 10/02/19 1751 100 %   Constitutional: Alert and oriented. Well appearing and in no acute distress. Eyes: Conjunctivae are normal.  Head: Atraumatic. Nose: Positive congestion/rhinnorhea. Mouth/Throat: Mucous membranes are moist.   Neck: No stridor.  Cardiovascular: Normal rate, regular rhythm. Good peripheral circulation. Grossly normal heart sounds.   Respiratory: Normal respiratory effort.  No retractions. Lungs CTAB. Gastrointestinal: Soft and nontender. No distention.  Musculoskeletal: No lower extremity tenderness nor edema. No gross deformities of extremities. Neurologic:  Normal speech and language. No gross focal neurologic deficits are appreciated.  Skin:  Skin is warm, dry and intact. No rash noted.  ____________________________________________  LABS (all labs ordered are listed, but only abnormal results are displayed)  Labs Reviewed  BASIC METABOLIC PANEL - Abnormal; Notable for the following components:      Result Value   Potassium 3.2 (*)    Glucose, Bld 115 (*)    Creatinine, Ser 1.90 (*)    GFR calc non Af Amer 43 (*)    GFR calc Af Amer  50 (*)    All other components within normal limits  CBC WITH DIFFERENTIAL/PLATELET - Abnormal; Notable for the following components:   RDW 16.3 (*)    All other components within normal limits  TROPONIN I (HIGH SENSITIVITY)  TROPONIN I (HIGH SENSITIVITY)   ____________________________________________  RADIOLOGY  No results found.  ____________________________________________   PROCEDURES  Procedure(s) performed:   Procedures  None  ____________________________________________   INITIAL IMPRESSION / ASSESSMENT AND PLAN / ED COURSE  Pertinent labs & imaging results that were available during my care of the patient were reviewed by me and considered in my medical decision making (see chart for details).   Patient presents emergency department for evaluation of asymptomatic hypertension.  He is compliant with his home medications.  His initial blood pressure in triage is elevated but he is completely asymptomatic.  I do not see any evidence of endorgan damage or hypertensive emergency.  Suspect that he has been taking daytime cold and flu medicines which may be increasing his blood pressure higher than normal.  Counseled him on medication safety take for cold and flu symptoms with hypertension. Discussed ED return precautions and PCP follow up plan.   ____________________________________________  FINAL CLINICAL IMPRESSION(S) / ED DIAGNOSES  Final diagnoses:  Essential hypertension  Nasal congestion    Note:  This document was prepared using Dragon voice recognition software and may include unintentional dictation errors.  Nanda Quinton, MD, [facility] Emergency Medicine    Jaidah Lomax, [facility], MD 10/03/19 2202

## 2019-10-03 ENCOUNTER — Other Ambulatory Visit (INDEPENDENT_AMBULATORY_CARE_PROVIDER_SITE_OTHER): Payer: Self-pay | Admitting: Primary Care

## 2019-10-03 DIAGNOSIS — I10 Essential (primary) hypertension: Secondary | ICD-10-CM

## 2019-10-04 ENCOUNTER — Ambulatory Visit (INDEPENDENT_AMBULATORY_CARE_PROVIDER_SITE_OTHER): Payer: Self-pay | Admitting: *Deleted

## 2019-10-04 NOTE — Telephone Encounter (Signed)
°  C/o elevated bp. 180/111 taken in left arm. Denies any neurological deficits at this time. C/o nasal stuffiness. Reports he is taking all prescribed medications for hypertension. Rechecked bp after 5- 10 min. For 173/106. Reports he has ben to ED and urgent care multiple times for the past 2 weeks. instructed patient to stop smoking. Only check bp > than 1 hour after smoking if he can not stop smoking. appt made for PCP 10/14/19. Patient refused to go back to ED or urgent care at this time due to being sent home for same bp issues. Wants to speak with PCP before August appt. Instructed patient if symptoms worsen to seek help at ED or urgent care immediately. Care advise given. Patient verbalized understanding of care advise and reports if symptoms worsen or bp gets higher he will seek treatment at ED or urgent care.   Reason for Disposition  [6] Systolic BP  >= 389 OR Diastolic >= 373 AND [4] missed most recent dose of blood pressure medication  Answer Assessment - Initial Assessment Questions 1. BLOOD PRESSURE: "What is the blood pressure?" "Did you take at least two measurements 5 minutes apart?"    180/111 lying  Rechecked for 173/106. 2. ONSET: "When did you take your blood pressure?"     Now  3. HOW: "How did you obtain the blood pressure?" (e.g., visiting nurse, automatic home BP monitor)     Automatic home bp monitor 4. HISTORY: "Do you have a history of high blood pressure?"     yes 5. MEDICATIONS: "Are you taking any medications for blood pressure?" "Have you missed any doses recently?"     Yes  Has not missed any doses. 6. OTHER SYMPTOMS: "Do you have any symptoms?" (e.g., headache, chest pain, blurred vision, difficulty breathing, weakness)     Nasal stuffiness only.  Protocols used: BLOOD PRESSURE - HIGH-A-AH

## 2019-10-07 NOTE — Telephone Encounter (Signed)
Please advice patient.

## 2019-10-08 NOTE — Telephone Encounter (Signed)
Called patient to discussed Bp has not been taking hydralazine 75mg  TID and he has been taking 25mg  TID. If Bp remains elevated will refer back to cardiology. Difficult controlling with stage 3 kidney disease.

## 2019-10-14 ENCOUNTER — Telehealth (INDEPENDENT_AMBULATORY_CARE_PROVIDER_SITE_OTHER): Payer: Self-pay | Admitting: Primary Care

## 2019-10-23 ENCOUNTER — Encounter: Payer: Self-pay | Admitting: Cardiology

## 2019-10-23 ENCOUNTER — Ambulatory Visit (INDEPENDENT_AMBULATORY_CARE_PROVIDER_SITE_OTHER): Payer: Self-pay | Admitting: Cardiology

## 2019-10-23 ENCOUNTER — Other Ambulatory Visit: Payer: Self-pay

## 2019-10-23 VITALS — BP 130/92 | HR 62 | Ht 73.0 in | Wt 202.0 lb

## 2019-10-23 DIAGNOSIS — G459 Transient cerebral ischemic attack, unspecified: Secondary | ICD-10-CM

## 2019-10-23 DIAGNOSIS — R931 Abnormal findings on diagnostic imaging of heart and coronary circulation: Secondary | ICD-10-CM

## 2019-10-23 DIAGNOSIS — N1831 Chronic kidney disease, stage 3a: Secondary | ICD-10-CM

## 2019-10-23 DIAGNOSIS — E782 Mixed hyperlipidemia: Secondary | ICD-10-CM | POA: Insufficient documentation

## 2019-10-23 DIAGNOSIS — R0989 Other specified symptoms and signs involving the circulatory and respiratory systems: Secondary | ICD-10-CM

## 2019-10-23 DIAGNOSIS — I10 Essential (primary) hypertension: Secondary | ICD-10-CM

## 2019-10-23 HISTORY — DX: Mixed hyperlipidemia: E78.2

## 2019-10-23 NOTE — Progress Notes (Signed)
Cardiology Office Note:    Date:  10/23/2019   ID:  Russell Hernandez, DOB 1981/02/16, MRN [id number]  PCP:  [name], NP  Cardiologist:  Berniece Salines, DO  Electrophysiologist:  None   Referring MD: [name], NP   Chief Complaint  Patient presents with  . Follow-up    History of Present Illness:    Russell Hernandez is a 39 y.o. male with a hx of  of hypertension, chronic kidney disease stage III a, marijuana use, tobacco use, recent TIA presents today to establish cardiac care. The patient was admitted at the [facility] in March for concerns for right sided numbness which time he was noted to be hypertensive.  During his hospital stay there is an echocardiogram done that showed that his EF was 45 to 50%. The patient was asked to follow-up with cardiology.  I initially saw the patient June 13, 2019 at that time his blood pressure was elevated, at that time we discussed portance of taking his blood pressure medication as well as working on not smoking anymore.  I saw the patient on July 12, 2019 at that time his blood pressure was improving.  I continue patient on his medication regimen.   Today he is here for follow-up visit.  The patient is frustrated he is noting that he was told by his PCP he has chronic kidney disease but we will not get any letter from his PCP stating he has chronic kidney disease to give to his employer.  He was requesting that I gave him paperwork stating he has chronic kidney disease unfortunately I deferred to his PCP as I would rather stick with taking care of matters to have to do with cardiovascular diseases.  I explained to him how elevated creatinine from my standpoint can affect the medication choices to treat his blood pressure.  I also explained to the patient that uncontrolled hypertension as well as diabetes could cause worsening kidney function.  Past Medical History:  Diagnosis Date  . AKI (acute kidney  injury) ([place]) 05/20/2019  . Depressed left ventricular ejection fraction 06/13/2019  . Hypertension   . Hypertensive urgency 05/19/2019  . Hypokalemia 05/20/2019  . Stage 3a chronic kidney disease 06/13/2019  . TIA (transient ischemic attack) 05/21/2019    Past Surgical History:  Procedure Laterality Date  . No past surgery      Current Medications: Current Meds  Medication Sig  . amLODipine (NORVASC) 10 MG tablet Take 1 tablet (10 mg total) by mouth daily.  Marland Kitchen aspirin EC 81 MG EC tablet Take 1 tablet (81 mg total) by mouth daily.  Marland Kitchen atorvastatin (LIPITOR) 20 MG tablet Take 1 tablet (20 mg total) by mouth daily at 6 PM.  . benzocaine (ORAJEL) 10 % mucosal gel Use as directed 1 application in the mouth or throat as needed for mouth pain.  . carvedilol (COREG) 25 MG tablet Take 1 tablet (25 mg total) by mouth 2 (two) times daily with a meal.  . cetirizine (ZYRTEC ALLERGY) 10 MG tablet Take 1 tablet (10 mg total) by mouth daily.  . chlorhexidine (PERIDEX) 0.12 % solution Use as directed 15 mLs in the mouth or throat 2 (two) times daily. Rinse and spit, do not swallow.  . cloNIDine (CATAPRES) 0.2 MG tablet Take 1 tablet (0.2 mg total) by mouth 2 (two) times daily.  . fluticasone (FLONASE) 50 MCG/ACT nasal spray Place 2 sprays into both nostrils daily for 7 days.  Marland Kitchen  hydrALAZINE (APRESOLINE) 25 MG tablet Take 3 tablets (75 mg total) by mouth every 8 (eight) hours.  . Iron, Ferrous Sulfate, 325 (65 Fe) MG TABS Take 325 mg by mouth daily.  . nitroGLYCERIN (NITROSTAT) 0.4 MG SL tablet Place 1 tablet (0.4 mg total) under the tongue every 5 (five) minutes as needed for chest pain.  Marland Kitchen senna (SENOKOT) 8.6 MG TABS tablet Take 1 tablet (8.6 mg total) by mouth daily.     Allergies:   Lisinopril and Onion   Social History   Socioeconomic History  . Marital status: Legally Separated    Spouse name: Not on file  . Number of children: Not on file  . Years of education: Not on file  . Highest education  level: Not on file  Occupational History  . Not on file  Tobacco Use  . Smoking status: Current Some Day Smoker    Packs/day: 0.50    Types: Cigarettes    Last attempt to quit: 06/06/2019    Years since quitting: 0.3  . Smokeless tobacco: Never Used  Vaping Use  . Vaping Use: Never used  Substance and Sexual Activity  . Alcohol use: Yes    Comment: occ  . Drug use: Yes    Types: Marijuana  . Sexual activity: Not on file  Other Topics Concern  . Not on file  Social History Narrative  . Not on file   Social Determinants of Health   Financial Resource Strain:   . Difficulty of Paying Living Expenses:   Food Insecurity:   . Worried About Charity fundraiser in the Last Year:   . Arboriculturist in the Last Year:   Transportation Needs:   . Film/video editor (Medical):   Marland Kitchen Lack of Transportation (Non-Medical):   Physical Activity:   . Days of Exercise per Week:   . Minutes of Exercise per Session:   Stress:   . Feeling of Stress :   Social Connections:   . Frequency of Communication with Friends and Family:   . Frequency of Social Gatherings with Friends and Family:   . Attends Religious Services:   . Active Member of Clubs or Organizations:   . Attends Archivist Meetings:   Marland Kitchen Marital Status:      Family History: The patient's family history includes Heart disease in his maternal grandmother; Hyperlipidemia in his mother; Hypertension in his maternal grandmother and mother.  ROS:   Review of Systems  Constitution: Negative for decreased appetite, fever and weight gain.  HENT: Negative for congestion, ear discharge, hoarse voice and sore throat.   Eyes: Negative for discharge, redness, vision loss in right eye and visual halos.  Cardiovascular: Negative for chest pain, dyspnea on exertion, leg swelling, orthopnea and palpitations.  Respiratory: Negative for cough, hemoptysis, shortness of breath and snoring.   Endocrine: Negative for heat intolerance  and polyphagia.  Hematologic/Lymphatic: Negative for bleeding problem. Does not bruise/bleed easily.  Skin: Negative for flushing, nail changes, rash and suspicious lesions.  Musculoskeletal: Negative for arthritis, joint pain, muscle cramps, myalgias, neck pain and stiffness.  Gastrointestinal: Negative for abdominal pain, bowel incontinence, diarrhea and excessive appetite.  Genitourinary: Negative for decreased libido, genital sores and incomplete emptying.  Neurological: Negative for brief paralysis, focal weakness, headaches and loss of balance.  Psychiatric/Behavioral: Negative for altered mental status, depression and suicidal ideas.  Allergic/Immunologic: Negative for HIV exposure and persistent infections.    EKGs/Labs/Other Studies Reviewed:    The following  studies were reviewed today:   EKG: None today  Transthoracic echocardiogramIMPRESSIONS3/8/21 1. Mild global reduction in LV systolic function; moderate LVH; grade 1  diastolic dysfunction.  2. Left ventricular ejection fraction, by estimation, is 45 to 50%. The  left ventricle has mildly decreased function. The left ventricle  demonstrates global hypokinesis. There is moderate left ventricular  hypertrophy. Left ventricular diastolic  parameters are consistent with Grade I diastolic dysfunction (impaired  relaxation).  3. Right ventricular systolic function is normal. The right ventricular  size is normal.  4. The mitral valve is normal in structure. Trivial mitral valve  regurgitation. No evidence of mitral stenosis.  5. The aortic valve is normal in structure. Aortic valve regurgitation is  not visualized. No aortic stenosis is present.  6. The inferior vena cava is normal in size with greater than 50%  respiratory variability, suggesting right atrial pressure of 3 mmHg.    Renal US Right: Normal size right kidney. No evidence of right renal artery     stenosis. Normal right Resisitive Index. RRV  flow present.  Left: Normal size of left kidney. No evidence of left renal artery     stenosis. Normal left Resistive Index. LRV flow present.  Mesenteric:  Normal Celiac artery and Superior Mesenteric artery findings.   CT headIMPRESSION:05/19/19 1. No intracranial abnormality. 2. Marked chronic left maxillary sinusitis.   MRIIMPRESSION:05/20/19 1. No acute finding. 2. Mild white matter disease with lacunes and intracranial vascular tortuosity correlating with history of chronic hypertension. 3. Chronic sinusitis, most notable at the left maxillary sinus where there is an associated middle meatus and nasal cavity polyp. Recommend elective ENT referral.  Recent Labs: 05/20/2019: B Natriuretic Peptide 99.6; TSH 1.894 06/11/2019: ALT 10 06/13/2019: Magnesium 2.2 10/02/2019: BUN 19; Creatinine, Ser 1.90; Hemoglobin 14.4; Platelets 399; Potassium 3.2; Sodium 135  Recent Lipid Panel    Component Value Date/Time   CHOL 298 (H) 05/20/2019 0627   TRIG 173 (H) 05/20/2019 0627   HDL 31 (L) 05/20/2019 0627   CHOLHDL 9.6 05/20/2019 0627   VLDL 35 05/20/2019 0627   LDLCALC 232 (H) 05/20/2019 0627    Physical Exam:    VS:  BP (!) 130/92 (BP Location: Right Arm, Patient Position: Sitting, Cuff Size: Normal)   Pulse 62   Ht 6\' 1"  (1.854 m)   Wt 202 lb (91.6 kg)   SpO2 96%   BMI 26.65 kg/m     Wt Readings from Last 3 Encounters:  10/23/19 202 lb (91.6 kg)  08/20/19 202 lb (91.6 kg)  08/17/19 200 lb (90.7 kg)     GEN: Well nourished, well developed in no acute distress HEENT: Normal NECK: No JVD; No carotid bruits LYMPHATICS: No lymphadenopathy CARDIAC: S1S2 noted,RRR, no murmurs, rubs, gallops RESPIRATORY:  Clear to auscultation without rales, wheezing or rhonchi  ABDOMEN: Soft, non-tender, non-distended, +bowel sounds, no guarding. EXTREMITIES: No edema, No cyanosis, no clubbing MUSCULOSKELETAL:  No deformity  SKIN: Warm and dry NEUROLOGIC:  Alert and oriented x 3,  non-focal PSYCHIATRIC:  Normal affect, good insight  ASSESSMENT:    1. TIA (transient ischemic attack)   2. Essential hypertension   3. Depressed left ventricular ejection fraction   4. Stage 3a chronic kidney disease   5. Mixed hyperlipidemia    PLAN:     His blood pressure is improving he is does have some slight diastolic hypertension.  Educated the patient to watch his salt intake.  He expresses understanding.  For now he will stay on his  current medication regimen which includes amlodipine 10 mg daily, carvedilol 25 mg twice daily, clonidine 0.2 mg twice daily, hydralazine 75 mg 3 times a day.  He has gone back to smoking half expressed to the patient that this is not advisable given his history of TIA as well as his hypertension.   TIA-continue patient aspirin 81 mg daily and atorvastatin 20 mg a day.  Chronic kidney disease-recent creatinine 1.9.  He was requesting that I gave him paperwork stating he has chronic kidney disease unfortunately I deferred to his PCP as I would rather stick with taking care of matters to have to do with cardiovascular diseases.  I explained to him how elevated creatinine from my standpoint can affect the medication choices to treat his blood pressure.  I also explained to the patient that uncontrolled hypertension as well as diabetes could cause worsening kidney function.   The patient is in agreement with the above plan. The patient left the office in stable condition.  The patient will follow up in 3 months or sooner if needed.   Medication Adjustments/Labs and Tests Ordered: Current medicines are reviewed at length with the patient today.  Concerns regarding medicines are outlined above.  No orders of the defined types were placed in this encounter.  No orders of the defined types were placed in this encounter.   Patient Instructions  Medication Instructions:  Your physician recommends that you continue on your current medications as  directed. Please refer to the Current Medication list given to you today.  *If you need a refill on your cardiac medications before your next appointment, please call your pharmacy*   Lab Work: None If you have labs (blood work) drawn today and your tests are completely normal, you will receive your results only by: Marland Kitchen MyChart Message (if you have MyChart) OR . A paper copy in the mail If you have any lab test that is abnormal or we need to change your treatment, we will call you to review the results.   Testing/Procedures: None   Follow-Up: At [facility], you and your health needs are our priority.  As part of our continuing mission to provide you with exceptional heart care, we have created designated Provider Care Teams.  These Care Teams include your primary Cardiologist (physician) and Advanced Practice Providers (APPs -  Physician Assistants and Nurse Practitioners) who all work together to provide you with the care you need, when you need it.  We recommend signing up for the patient portal called "MyChart".  Sign up information is provided on this After Visit Summary.  MyChart is used to connect with patients for Virtual Visits (Telemedicine).  Patients are able to view lab/test results, encounter notes, upcoming appointments, etc.  Non-urgent messages can be sent to your provider as well.   To learn more about what you can do with MyChart, go to [email or web address].    Your next appointment:   3 month(s)  The format for your next appointment:   In Person  Provider:   Berniece Salines, DO   Other Instructions      Adopting a Healthy Lifestyle.  Know what a healthy weight is for you (roughly BMI <25) and aim to maintain this   Aim for 7+ servings of fruits and vegetables daily   65-80+ fluid ounces of water or unsweet tea for healthy kidneys   Limit to max 1 drink of alcohol per day; avoid smoking/tobacco   Limit animal fats in diet for cholesterol and  heart  health - choose grass fed whenever available   Avoid highly processed foods, and foods high in saturated/trans fats   Aim for low stress - take time to unwind and care for your mental health   Aim for 150 min of moderate intensity exercise weekly for heart health, and weights twice weekly for bone health   Aim for 7-9 hours of sleep daily   When it comes to diets, agreement about the perfect plan isnt easy to find, even among the experts. Experts at the [employer] developed an idea known as the Healthy Eating Plate. Just imagine a plate divided into logical, healthy portions.   The emphasis is on diet quality:   Load up on vegetables and fruits - one-half of your plate: Aim for color and variety, and remember that potatoes dont count.   Go for whole grains - one-quarter of your plate: Whole wheat, barley, wheat berries, quinoa, oats, brown rice, and foods made with them. If you want pasta, go with whole wheat pasta.   Protein power - one-quarter of your plate: Fish, chicken, beans, and nuts are all healthy, versatile protein sources. Limit red meat.   The diet, however, does go beyond the plate, offering a few other suggestions.   Use healthy plant oils, such as olive, canola, soy, corn, sunflower and peanut. Check the labels, and avoid partially hydrogenated oil, which have unhealthy trans fats.   If youre thirsty, drink water. Coffee and tea are good in moderation, but skip sugary drinks and limit milk and dairy products to one or two daily servings.   The type of carbohydrate in the diet is more important than the amount. Some sources of carbohydrates, such as vegetables, fruits, whole grains, and beans-are healthier than others.   Finally, stay active  Signed, Berniece Salines, DO  10/23/2019 10:00 AM    [place]

## 2019-10-23 NOTE — Patient Instructions (Signed)

## 2019-11-11 ENCOUNTER — Emergency Department (HOSPITAL_BASED_OUTPATIENT_CLINIC_OR_DEPARTMENT_OTHER)
Admission: EM | Admit: 2019-11-11 | Discharge: 2019-11-11 | Disposition: A | Discharge status: Home / Self Care | Payer: Self-pay | Attending: Emergency Medicine | Admitting: Emergency Medicine

## 2019-11-11 ENCOUNTER — Encounter (HOSPITAL_BASED_OUTPATIENT_CLINIC_OR_DEPARTMENT_OTHER): Payer: Self-pay | Admitting: *Deleted

## 2019-11-11 ENCOUNTER — Other Ambulatory Visit: Payer: Self-pay

## 2019-11-11 DIAGNOSIS — K0889 Other specified disorders of teeth and supporting structures: Secondary | ICD-10-CM | POA: Insufficient documentation

## 2019-11-11 DIAGNOSIS — Z79899 Other long term (current) drug therapy: Secondary | ICD-10-CM | POA: Insufficient documentation

## 2019-11-11 DIAGNOSIS — Z7982 Long term (current) use of aspirin: Secondary | ICD-10-CM | POA: Insufficient documentation

## 2019-11-11 DIAGNOSIS — N1831 Chronic kidney disease, stage 3a: Secondary | ICD-10-CM | POA: Insufficient documentation

## 2019-11-11 DIAGNOSIS — I129 Hypertensive chronic kidney disease with stage 1 through stage 4 chronic kidney disease, or unspecified chronic kidney disease: Secondary | ICD-10-CM | POA: Insufficient documentation

## 2019-11-11 DIAGNOSIS — F17210 Nicotine dependence, cigarettes, uncomplicated: Secondary | ICD-10-CM | POA: Insufficient documentation

## 2019-11-11 MED ORDER — PENICILLIN V POTASSIUM 500 MG PO TABS
500.0000 mg | ORAL_TABLET | Freq: Four times a day (QID) | ORAL | 0 refills | Status: AC
Start: 2019-11-11 — End: 2019-11-18

## 2019-11-11 NOTE — ED Triage Notes (Signed)
C/o dental pain x 2 days

## 2019-11-11 NOTE — Discharge Instructions (Addendum)
You have been seen here for dental pain.  Exam looks reassuring.  Please use over-the-counter pain medications like Tylenol every 6 hours as needed.  Please follow dosing on the back of bottle.  You may also apply ice to the area as this can decrease inflammation and swelling.  I prescribed you an antibiotic please take as prescribed.  You may also pick up benzocaine a mouth gel that can help numb the area please use as prescribed.  I have given the contact information for a dentist please call them at your convenience as I feel you need further evaluation management from them.  I want to come back to emergency department if you develop difficulty swallowing, swelling of your tongue or throat, difficulty breathing, chest pain, abdominal pain, nausea, vomiting, diarrhea disease as these require further evaluation management.

## 2019-11-11 NOTE — ED Provider Notes (Signed)
[facility] EMERGENCY DEPARTMENT Provider Note   CSN: [id number] Arrival date & time: 11/11/19  1240     History Chief Complaint  Patient presents with  . Dental Pain    Russell Hernandez is a 39 y.o. male.  HPI   Patient with significant medical history of AKI, hypertension, CKD stage IIIA presents to the emergency department with chief complaint of right upper dental pain that started 2 days ago.  Patient states he was chewing ice and felt his tooth crack.  Since then he has had increasing pain on that right upper side, denies facial swelling, fever, chills, difficulty swallowing or opening of his jaw.  He states he does not have a dentist and has not seen them in a long time.  Has never experienced this pain before.  Denies any alleviating or aggravating factors.  Denies headache, fever, chills, sore throat, chest pain, dominant, abdominal pain, pedal edema.  Past Medical History:  Diagnosis Date  . AKI (acute kidney injury) ([place]) 05/20/2019  . Depressed left ventricular ejection fraction 06/13/2019  . Hypertension   . Hypertensive urgency 05/19/2019  . Hypokalemia 05/20/2019  . Stage 3a chronic kidney disease 06/13/2019  . TIA (transient ischemic attack) 05/21/2019    Patient Active Problem List   Diagnosis Date Noted  . Mixed hyperlipidemia 10/23/2019  . Stage 3a chronic kidney disease 06/13/2019  . Depressed left ventricular ejection fraction 06/13/2019  . Hypertension   . Hypokalemia 05/20/2019  . AKI (acute kidney injury) ([place]) 05/20/2019  . TIA (transient ischemic attack)   . Hypertensive urgency 05/19/2019    Past Surgical History:  Procedure Laterality Date  . No past surgery         Family History  Problem Relation Age of Onset  . Hypertension Mother   . Hyperlipidemia Mother   . Hypertension Maternal Grandmother   . Heart disease Maternal Grandmother     Social History   Tobacco Use  . Smoking status: Current Some Day Smoker    Packs/day:  0.50    Types: Cigarettes    Last attempt to quit: 06/06/2019    Years since quitting: 0.4  . Smokeless tobacco: Never Used  Vaping Use  . Vaping Use: Never used  Substance Use Topics  . Alcohol use: Yes    Comment: occ  . Drug use: Yes    Types: Marijuana    Home Medications Prior to Admission medications   Medication Sig Start Date End Date Taking? Authorizing Provider  amLODipine (NORVASC) 10 MG tablet Take 1 tablet (10 mg total) by mouth daily. 06/11/19 12/08/19  [name], NP  aspirin EC 81 MG EC tablet Take 1 tablet (81 mg total) by mouth daily. 05/25/19   Mercy Riding, MD  atorvastatin (LIPITOR) 20 MG tablet Take 1 tablet (20 mg total) by mouth daily at 6 PM. 06/11/19   [name], NP  benzocaine (ORAJEL) 10 % mucosal gel Use as directed 1 application in the mouth or throat as needed for mouth pain. 08/17/19   Nuala Alpha A, PA-C  carvedilol (COREG) 25 MG tablet Take 1 tablet (25 mg total) by mouth 2 (two) times daily with a meal. 06/11/19   [name], NP  cetirizine (ZYRTEC ALLERGY) 10 MG tablet Take 1 tablet (10 mg total) by mouth daily. 08/01/19   Gareth Morgan, MD  chlorhexidine (PERIDEX) 0.12 % solution Use as directed 15 mLs in the mouth or throat 2 (two) times daily. Rinse and spit,  do not swallow. 08/17/19   Nuala Alpha A, PA-C  cloNIDine (CATAPRES) 0.2 MG tablet Take 1 tablet (0.2 mg total) by mouth 2 (two) times daily. 06/11/19   [name], NP  fluticasone (FLONASE) 50 MCG/ACT nasal spray Place 2 sprays into both nostrils daily for 7 days. 10/02/19 10/23/19  Long, Wonda Olds, MD  hydrALAZINE (APRESOLINE) 25 MG tablet Take 3 tablets (75 mg total) by mouth every 8 (eight) hours. 06/11/19   [name], NP  Iron, Ferrous Sulfate, 325 (65 Fe) MG TABS Take 325 mg by mouth daily. 06/20/19   [name], NP  nitroGLYCERIN (NITROSTAT) 0.4 MG SL tablet Place 1 tablet (0.4 mg total) under the tongue every 5 (five) minutes as needed  for chest pain. 06/11/19   [name], NP  penicillin v potassium (VEETID) 500 MG tablet Take 1 tablet (500 mg total) by mouth 4 (four) times daily for 7 days. 11/11/19 11/18/19  Marcello Fennel, PA-C  senna (SENOKOT) 8.6 MG TABS tablet Take 1 tablet (8.6 mg total) by mouth daily. 06/20/19   [name], NP  hydrochlorothiazide (HYDRODIURIL) 25 MG tablet Take 1 tablet (25 mg total) by mouth daily. 12/10/16 02/05/19  [name], MD  lisinopril (PRINIVIL,ZESTRIL) 10 MG tablet Take 1 tablet (10 mg total) by mouth daily. 12/10/16 02/05/19  [name], MD  omeprazole (PRILOSEC) 20 MG capsule Take 1 capsule (20 mg total) by mouth daily. 12/11/16 02/05/19  Doristine Devoid, PA-C  ranitidine (ZANTAC) 150 MG tablet Take 1 tablet (150 mg total) by mouth 2 (two) times daily. 12/13/16 02/05/19  [name], [name], MD    Allergies    Lisinopril and Onion  Review of Systems   Review of Systems  Constitutional: Negative for chills and fever.  HENT: Positive for dental problem. Negative for congestion, ear pain, facial swelling, tinnitus and trouble swallowing.   Eyes: Negative for visual disturbance.  Respiratory: Negative for cough and shortness of breath.   Cardiovascular: Negative for chest pain.  Gastrointestinal: Negative for abdominal pain, diarrhea, nausea and vomiting.  Genitourinary: Negative for enuresis.  Musculoskeletal: Negative for back pain.  Skin: Negative for rash.  Neurological: Negative for dizziness and headaches.  Hematological: Does not bruise/bleed easily.    Physical Exam Updated Vital Signs BP (!) 147/88 (BP Location: Right Arm)   Pulse (!) 52   Temp 98.1 F (36.7 C) (Oral)   Resp 16   Ht $R'6\' 1"'YO$  (1.854 m)   Wt 95.3 kg   SpO2 95%   BMI 27.71 kg/m   Physical Exam Vitals and nursing note reviewed.  Constitutional:      General: He is not in acute distress.    Appearance: He is not ill-appearing.  HENT:     Head: Normocephalic and atraumatic.      Nose: No congestion.     Mouth/Throat:     Mouth: Mucous membranes are moist.     Pharynx: Oropharynx is clear. No oropharyngeal exudate or posterior oropharyngeal erythema.     Comments: Patient's mouth visualized, uvula and tongue were both midline, controlling oral secretions with difficulty.  Right upper back molar had a crack in it, no abscess noted, no fluctuance or indurations felt along patient's gumline.  Patient is able to open his mouth out difficulty.  No facial swelling noted. Eyes:     General: No scleral icterus. Cardiovascular:     Rate and Rhythm: Normal rate and regular rhythm.     Pulses: Normal pulses.  Heart sounds: No murmur heard.  No friction rub. No gallop.   Pulmonary:     Effort: No respiratory distress.     Breath sounds: No wheezing, rhonchi or rales.  Abdominal:     General: There is no distension.     Tenderness: There is no abdominal tenderness. There is no right CVA tenderness, left CVA tenderness or guarding.  Musculoskeletal:        General: No swelling.  Skin:    General: Skin is warm and dry.     Capillary Refill: Capillary refill takes less than 2 seconds.     Findings: No rash.  Neurological:     Mental Status: He is alert.  Psychiatric:        Mood and Affect: Mood normal.     ED Results / Procedures / Treatments   Labs (all labs ordered are listed, but only abnormal results are displayed) Labs Reviewed - No data to display  EKG None  Radiology No results found.  Procedures Procedures (including critical care time)  Medications Ordered in ED Medications - No data to display  ED Course  I have reviewed the triage vital signs and the nursing notes.  Pertinent labs & imaging results that were available during my care of the patient were reviewed by me and considered in my medical decision making (see chart for details).    MDM Rules/Calculators/A&P                          I have personally reviewed all imaging, labs and  have interpreted them.  Patient presents to the emergency department with chief complaint of right upper dental pain.  Patient was alert and oriented did not appear in acute distress, vital signs reassuring.  On exam no facial swelling noted, no trismus present, oral cavity was visualized no sign of impending airway compromise noted, right upper molar had a crack in it no abscess noted.  I have low suspicion for periodontal abscess, retropharyngeal abscess, Ludwig angina as tongue and uvula are both midline, patient was denying torticollis, patient was controlling his own secretions out difficulty, no wheezing or stridor heard on exam.  Low suspicion for overlying cellulitis as there is no facial swelling, not warm or erythematous on exam.  Low suspicion for dental abscess as gumline was palpated no fluctuance or indurations felt.  Patient was provided antibiotics for coverage of bacterial infection and referred to a dentist for further evaluation.  Due to well-appearing patient benign physical exam further lab work and imaging were not warranted.  Patient appears to be resting calmly in bed showing no acute signs chest.  Vital signs remained stable does not meet criteria be met the hospital.  Patient has a chipped molar on the right upper jaw.  Will prescribe antibiotics and refer him to a dentist for further evaluation.  Patient discussed with attending who agrees assessment plan.  Patient is given at home care and strict return precautions.  Patient verbalized understood agree with said plan. Final Clinical Impression(s) / ED Diagnoses Final diagnoses:  Pain, dental    Rx / DC Orders ED Discharge Orders         Ordered    penicillin v potassium (VEETID) 500 MG tablet  4 times daily        11/11/19 1543           Carroll Sage, PA-C 11/11/19 1607    Tegeler, Canary Brim, MD 11/11/19  2233  

## 2019-11-20 ENCOUNTER — Ambulatory Visit (INDEPENDENT_AMBULATORY_CARE_PROVIDER_SITE_OTHER): Payer: Self-pay | Admitting: Primary Care

## 2019-11-29 ENCOUNTER — Other Ambulatory Visit: Payer: Self-pay

## 2019-11-29 ENCOUNTER — Ambulatory Visit (INDEPENDENT_AMBULATORY_CARE_PROVIDER_SITE_OTHER): Payer: Self-pay | Admitting: Primary Care

## 2019-11-29 ENCOUNTER — Encounter (INDEPENDENT_AMBULATORY_CARE_PROVIDER_SITE_OTHER): Payer: Self-pay | Admitting: Primary Care

## 2019-11-29 VITALS — BP 124/82 | HR 66 | Temp 98.3°F | Ht 73.0 in | Wt 204.2 lb

## 2019-11-29 DIAGNOSIS — I10 Essential (primary) hypertension: Secondary | ICD-10-CM

## 2019-11-29 DIAGNOSIS — J320 Chronic maxillary sinusitis: Secondary | ICD-10-CM

## 2019-11-29 MED ORDER — PREDNISONE 10 MG (21) PO TBPK
ORAL_TABLET | ORAL | 0 refills | Status: DC
Start: 2019-11-29 — End: 2019-12-31

## 2019-11-29 MED ORDER — FEXOFENADINE-PSEUDOEPHED ER 180-240 MG PO TB24: 1.0000 | ORAL_TABLET | Freq: Every day | ORAL | 1 refills | Status: DC

## 2019-11-29 NOTE — Patient Instructions (Signed)

## 2019-11-29 NOTE — Progress Notes (Signed)
[facility] for  hypertension evaluation, on previous visit medication was Coreg 25 mg twice daily, amlodipine 10 mg daily.  and clonidine 0.2 mg twice daily.  Today's blood pressure unremarkable 124/82.  Current Medication List Current Outpatient Medications on File Prior to Visit  Medication Sig Dispense Refill   amLODipine (NORVASC) 10 MG tablet Take 1 tablet (10 mg total) by mouth daily. 90 tablet 1   aspirin EC 81 MG EC tablet Take 1 tablet (81 mg total) by mouth daily. 90 tablet 1   atorvastatin (LIPITOR) 20 MG tablet Take 1 tablet (20 mg total) by mouth daily at 6 PM. 90 tablet 1   carvedilol (COREG) 25 MG tablet Take 1 tablet (25 mg total) by mouth 2 (two) times daily with a meal. 180 tablet 1   cloNIDine (CATAPRES) 0.2 MG tablet Take 1 tablet (0.2 mg total) by mouth 2 (two) times daily. 180 tablet 1   Iron, Ferrous Sulfate, 325 (65 Fe) MG TABS Take 325 mg by mouth daily. 30 tablet 6   nitroGLYCERIN (NITROSTAT) 0.4 MG SL tablet Place 1 tablet (0.4 mg total) under the tongue every 5 (five) minutes as needed for chest pain. 50 tablet 3   fluticasone (FLONASE) 50 MCG/ACT nasal spray Place 2 sprays into both nostrils daily for 7 days. 1 g 0   [DISCONTINUED] hydrochlorothiazide (HYDRODIURIL) 25 MG tablet Take 1 tablet (25 mg total) by mouth daily. 30 tablet 2   [DISCONTINUED] lisinopril (PRINIVIL,ZESTRIL) 10 MG tablet Take 1 tablet (10 mg total) by mouth daily. 30 tablet 0   [DISCONTINUED] omeprazole (PRILOSEC) 20 MG capsule Take 1 capsule (20 mg total) by mouth daily. 30 capsule 0   [DISCONTINUED] ranitidine (ZANTAC) 150 MG tablet Take 1 tablet (150 mg total) by mouth 2 (two) times daily. 60 tablet 0   No current facility-administered medications on file prior to visit.   Past Medical History  Past Medical History:  Diagnosis Date   AKI (acute kidney injury) ([place]) 05/20/2019   Depressed left ventricular ejection fraction 06/13/2019    Hypertension    Hypertensive urgency 05/19/2019   Hypokalemia 05/20/2019   Stage 3a chronic kidney disease 06/13/2019   TIA (transient ischemic attack) 05/21/2019   Dietary habits include: decreasing sodium in diet Exercise habits include:walking  Family / Social history: none  ASCVD risk factors include- [place] The ASCVD Risk score [name] DC Jr., et al., 2013) failed to calculate for the following reasons:   The 2013 ASCVD risk score is only valid for ages 26 to 10 O:  Physical Exam  Review of Systems  All other systems reviewed and are negative.   Last 3 Office BP readings: BP Readings from Last 3 Encounters:  11/29/19 124/82  11/11/19 132/78  10/23/19 (!) 130/92    BMET    Component Value Date/Time   NA 135 10/02/2019 1946   NA 139 06/13/2019 1632   K 3.2 (L) 10/02/2019 1946   CL 98 10/02/2019 1946   CO2 23 10/02/2019 1946   GLUCOSE 115 (H) 10/02/2019 1946   BUN 19 10/02/2019 1946   BUN 14 06/13/2019 1632   CREATININE 1.90 (H) 10/02/2019 1946   CALCIUM 9.3 10/02/2019 1946   GFRNONAA 43 (L) 10/02/2019 1946   GFRAA 50 (L) 10/02/2019 1946    Renal function: CrCl cannot be calculated (Patient's most recent lab result is older than the maximum 21 days allowed.).  Clinical ASCVD: No  The ASCVD Risk score [name] DC Brooke Bonito., et  al., 2013) failed to calculate for the following reasons:   The 2013 ASCVD risk score is only valid for ages 77 to 91   [place] was seen today for blood pressure check and sinusitis.  Diagnoses and all orders for this visit:  Essential hypertension Hypertension longstanding diagnosed currently  on current medications.Coreg 25 mg twice daily, amlodipine 10 mg daily.  and clonidine 0.2 mg twice daily. BP Goal = 130/80  mmHg. Patient is adherent with current medications.  -Continued .  -F/u labs ordered - none -Counseled on lifestyle modifications for blood pressure control including reduced dietary sodium, increased exercise, adequate  sleep  Chronic maxillary sinusitis Acute sinusitis  related to a viral infection and will resolve on their own within 10 days. Sometimes medicines are prescribed to help relieve symptoms (pain medicine, decongestants, nasal steroid sprays, or saline sprays).   Other orders -     predniSONE (STERAPRED UNI-PAK 21 TAB) 10 MG (21) TBPK tablet; Day 1 take 6 pills by mouth once daily; Day 2 take 5 pills once daily; Day 3 take 4 pills once daily; Day 4 take 3 pills once daily; Day 5 take 2 tablets once daily; Day 6 take 1 tablet daily -     fexofenadine-pseudoephedrine (ALLEGRA-D 24) 180-240 MG 24 hr tablet; Take 1 tablet by mouth daily.    [name]

## 2019-12-23 ENCOUNTER — Other Ambulatory Visit (INDEPENDENT_AMBULATORY_CARE_PROVIDER_SITE_OTHER): Payer: Self-pay | Admitting: Primary Care

## 2019-12-23 MED ORDER — CARVEDILOL 25 MG PO TABS: 25.0000 mg | ORAL_TABLET | Freq: Two times a day (BID) | ORAL | 1 refills | Status: DC

## 2019-12-23 MED ORDER — CLONIDINE HCL 0.2 MG PO TABS: 0.2000 mg | ORAL_TABLET | Freq: Two times a day (BID) | ORAL | 1 refills | Status: DC

## 2019-12-23 MED ORDER — AMLODIPINE BESYLATE 10 MG PO TABS: 10.0000 mg | ORAL_TABLET | Freq: Every day | ORAL | 1 refills | Status: DC

## 2019-12-23 NOTE — Telephone Encounter (Signed)
Medication: carvedilol (COREG) 25 MG tablet [[id number]] , Iron, Ferrous Sulfate, 325 (65 Fe) MG TABS [[id number]] , amLODipine (NORVASC) 10 MG tablet [[id number]]  ENDED, atorvastatin (LIPITOR) 20 MG tablet [[id number]] , cloNIDine (CATAPRES) 0.2 MG tablet [[id number]] , predniSONE (STERAPRED UNI-PAK 21 TAB) 10 MG (21) TBPK tablet [[id number]]   Has the patient contacted their pharmacy? YES  (Agent: If no, request that the patient contact the pharmacy for the refill.) (Agent: If yes, when and what did the pharmacy advise?)  Preferred Pharmacy (with phone number or street name): [facility] [address] [place] [address] Phone: [phone] Fax: [phone] Hours: Not open 24 hours    Agent: Please be advised that RX refills may take up to 3 business days. We ask that you follow-up with your pharmacy.

## 2019-12-23 NOTE — Telephone Encounter (Signed)
Requested medication (s) are due for refill today: yes  Requested medication (s) are on the active medication list: yes  Last refill:  multiple  Future visit scheduled:yes  Notes to clinic: Iron ferritin and Iron level never checked, atorvastatin labs out of date, Prednisone was a dose pack    Requested Prescriptions  Pending Prescriptions Disp Refills   Iron, Ferrous Sulfate, 325 (65 Fe) MG TABS 30 tablet 6    Sig: Take 325 mg by mouth daily.      Endocrinology:  Minerals - Iron Supplementation Failed - 12/23/2019  4:20 PM      Failed - Fe (serum) in normal range and within 360 days    No results found for: IRON, IRONPCTSAT        Failed - Ferritin in normal range and within 360 days    No results found for: FERRITIN        Passed - HGB in normal range and within 360 days    Hemoglobin  Date Value Ref Range Status  10/02/2019 14.4 13.0 - 17.0 g/dL Final  06/11/2019 12.3 (L) 13.0 - 17.7 g/dL Final          Passed - HCT in normal range and within 360 days    HCT  Date Value Ref Range Status  10/02/2019 43.6 39 - 52 % Final   Hematocrit  Date Value Ref Range Status  06/11/2019 37.4 (L) 37.5 - 51.0 % Final          Passed - RBC in normal range and within 360 days    RBC  Date Value Ref Range Status  10/02/2019 5.16 4.22 - 5.81 MIL/uL Final          Passed - Valid encounter within last 12 months    Recent Outpatient Visits           3 weeks ago Essential hypertension   [place], Michelle P, NP   4 months ago Essential hypertension   [place], Michelle P, NP   6 months ago Need for Tdap vaccination   [place], Michelle P, NP       Future Appointments             In 3 weeks Tobb, Kardie, DO [facility]   In 2 months [place], Milford Cage, NP [facility]              atorvastatin (LIPITOR) 20 MG tablet 90 tablet  1    Sig: Take 1 tablet (20 mg total) by mouth daily at 6 PM.      Cardiovascular:  Antilipid - Statins Failed - 12/23/2019  4:20 PM      Failed - Total Cholesterol in normal range and within 360 days    Cholesterol  Date Value Ref Range Status  05/20/2019 298 (H) 0 - 200 mg/dL Final          Failed - LDL in normal range and within 360 days    LDL Cholesterol  Date Value Ref Range Status  05/20/2019 232 (H) 0 - 99 mg/dL Final    Comment:           Total Cholesterol/HDL:CHD Risk Coronary Heart Disease Risk Table                     Men   Women  1/2 Average Risk   3.4  3.3  Average Risk       5.0   4.4  2 X Average Risk   9.6   7.1  3 X Average Risk  23.4   11.0        Use the calculated Patient Ratio above and the CHD Risk Table to determine the patient's CHD Risk.        ATP III CLASSIFICATION (LDL):  <100     mg/dL   Optimal  100-129  mg/dL   Near or Above                    Optimal  130-159  mg/dL   Borderline  160-189  mg/dL   High  >190     mg/dL   Very High Performed at [facility] [address] [place], [place] [address]           Failed - HDL in normal range and within 360 days    HDL  Date Value Ref Range Status  05/20/2019 31 (L) >40 mg/dL Final          Failed - Triglycerides in normal range and within 360 days    Triglycerides  Date Value Ref Range Status  05/20/2019 173 (H) <150 mg/dL Final          Passed - Patient is not pregnant      Passed - Valid encounter within last 12 months    Recent Outpatient Visits           3 weeks ago Essential hypertension   [place], Michelle P, NP   4 months ago Essential hypertension   [place], Michelle P, NP   6 months ago Need for Tdap vaccination   [place] [name], NP       Future Appointments             In 3 weeks Tobb, Kardie, DO [facility]   In 2 months  [place], Milford Cage, NP [facility]              predniSONE (STERAPRED UNI-PAK 21 TAB) 10 MG (21) TBPK tablet 21 tablet 0    Sig: Day 1 take 6 pills by mouth once daily; Day 2 take 5 pills once daily; Day 3 take 4 pills once daily; Day 4 take 3 pills once daily; Day 5 take 2 tablets once daily; Day 6 take 1 tablet daily      Not Delegated - Endocrinology:  Oral Corticosteroids Failed - 12/23/2019  4:20 PM      Failed - This refill cannot be delegated      Passed - Last BP in normal range    BP Readings from Last 1 Encounters:  11/29/19 124/82          Passed - Valid encounter within last 6 months    Recent Outpatient Visits           3 weeks ago Essential hypertension   [place], Michelle P, NP   4 months ago Essential hypertension   [place] [name], NP   6 months ago Need for Tdap vaccination   [place], Michelle P, NP       Future Appointments             In 3 weeks  Tobb, Kardie, DO [facility]   In 2 months Edwards, Milford Cage, NP [facility]             Signed Prescriptions Disp Refills   carvedilol (COREG) 25 MG tablet 180 tablet 1    Sig: Take 1 tablet (25 mg total) by mouth 2 (two) times daily with a meal.      Cardiovascular:  Beta Blockers Passed - 12/23/2019  4:20 PM      Passed - Last BP in normal range    BP Readings from Last 1 Encounters:  11/29/19 124/82          Passed - Last Heart Rate in normal range    Pulse Readings from Last 1 Encounters:  11/29/19 66          Passed - Valid encounter within last 6 months    Recent Outpatient Visits           3 weeks ago Essential hypertension   [place] [name], NP   4 months ago Essential hypertension   [place] Juluis Mire P, NP   6 months ago Need for Tdap  vaccination   [place], Michelle P, NP       Future Appointments             In 3 weeks Tobb, Kardie, DO [facility]   In 2 months [place], Milford Cage, NP [facility]              cloNIDine (CATAPRES) 0.2 MG tablet 180 tablet 1    Sig: Take 1 tablet (0.2 mg total) by mouth 2 (two) times daily.      Cardiovascular:  Alpha-2 Agonists Passed - 12/23/2019  4:20 PM      Passed - Last BP in normal range    BP Readings from Last 1 Encounters:  11/29/19 124/82          Passed - Last Heart Rate in normal range    Pulse Readings from Last 1 Encounters:  11/29/19 66          Passed - Valid encounter within last 6 months    Recent Outpatient Visits           3 weeks ago Essential hypertension   [place] [name], NP   4 months ago Essential hypertension   [place] [name], NP   6 months ago Need for Tdap vaccination   [place], Michelle P, NP       Future Appointments             In 3 weeks Tobb, Kardie, DO [facility]   In 2 months [place], Milford Cage, NP [facility]              amLODipine (NORVASC) 10 MG tablet 90 tablet 1    Sig: Take 1 tablet (10 mg total) by mouth daily.      Cardiovascular:  Calcium Channel Blockers Passed - 12/23/2019  4:20 PM      Passed - Last BP in normal range    BP Readings from Last 1 Encounters:  11/29/19 124/82          Passed - Valid encounter within last 6 months    Recent Outpatient Visits  3 weeks ago Essential hypertension   [facility] [name], NP   4 months ago Essential hypertension   [place] [name], NP   6 months ago Need for Tdap vaccination   [place], Michelle P, NP        Future Appointments             In 3 weeks Tobb, Godfrey Pick, DO [facility]   In 2 months Oletta Lamas, Milford Cage, NP [place]

## 2019-12-23 NOTE — Telephone Encounter (Signed)
Requested Prescriptions  Pending Prescriptions Disp Refills  . carvedilol (COREG) 25 MG tablet 180 tablet 1    Sig: Take 1 tablet (25 mg total) by mouth 2 (two) times daily with a meal.     Cardiovascular:  Beta Blockers Passed - 12/23/2019  4:20 PM      Passed - Last BP in normal range    BP Readings from Last 1 Encounters:  11/29/19 124/82         Passed - Last Heart Rate in normal range    Pulse Readings from Last 1 Encounters:  11/29/19 66         Passed - Valid encounter within last 6 months    Recent Outpatient Visits          3 weeks ago Essential hypertension   [facility], Michelle P, NP   4 months ago Essential hypertension   [place], Michelle P, NP   6 months ago Need for Tdap vaccination   [place], [place], NP      Future Appointments            In 3 weeks Tobb, Godfrey Pick, DO [facility]   In 2 months Oletta Lamas, Milford Cage, NP [place]           . cloNIDine (CATAPRES) 0.2 MG tablet 180 tablet 1    Sig: Take 1 tablet (0.2 mg total) by mouth 2 (two) times daily.     Cardiovascular:  Alpha-2 Agonists Passed - 12/23/2019  4:20 PM      Passed - Last BP in normal range    BP Readings from Last 1 Encounters:  11/29/19 124/82         Passed - Last Heart Rate in normal range    Pulse Readings from Last 1 Encounters:  11/29/19 66         Passed - Valid encounter within last 6 months    Recent Outpatient Visits          3 weeks ago Essential hypertension   [place] [name], NP   4 months ago Essential hypertension   [place] [name], NP   6 months ago Need for Tdap vaccination   [place], [place], NP      Future Appointments            In 3 weeks Tobb, Godfrey Pick, DO [facility]   In 2 months  Oletta Lamas, Milford Cage, NP [place]           . Iron, Ferrous Sulfate, 325 (65 Fe) MG TABS 30 tablet 6    Sig: Take 325 mg by mouth daily.     Endocrinology:  Minerals - Iron Supplementation Failed - 12/23/2019  4:20 PM      Failed - Fe (serum) in normal range and within 360 days    No results found for: IRON, IRONPCTSAT       Failed - Ferritin in normal range and within 360 days    No results found for: FERRITIN       Passed - HGB in normal range and within 360 days    Hemoglobin  Date Value Ref Range Status  10/02/2019 14.4 13.0 - 17.0 g/dL Final  06/11/2019 12.3 (L) 13.0 - 17.7 g/dL Final  Passed - HCT in normal range and within 360 days    HCT  Date Value Ref Range Status  10/02/2019 43.6 39 - 52 % Final   Hematocrit  Date Value Ref Range Status  06/11/2019 37.4 (L) 37.5 - 51.0 % Final         Passed - RBC in normal range and within 360 days    RBC  Date Value Ref Range Status  10/02/2019 5.16 4.22 - 5.81 MIL/uL Final         Passed - Valid encounter within last 12 months    Recent Outpatient Visits          3 weeks ago Essential hypertension   [place], Michelle P, NP   4 months ago Essential hypertension   [place], Michelle P, NP   6 months ago Need for Tdap vaccination   [place], Michelle P, NP      Future Appointments            In 3 weeks Tobb, Kardie, DO [facility]   In 2 months [place], Milford Cage, NP [facility]           . amLODipine (NORVASC) 10 MG tablet 90 tablet 1    Sig: Take 1 tablet (10 mg total) by mouth daily.     Cardiovascular:  Calcium Channel Blockers Passed - 12/23/2019  4:20 PM      Passed - Last BP in normal range    BP Readings from Last 1 Encounters:  11/29/19 124/82         Passed - Valid encounter within last 6 months    Recent Outpatient Visits           3 weeks ago Essential hypertension   [place] [name], NP   4 months ago Essential hypertension   [place] [name], NP   6 months ago Need for Tdap vaccination   [place], [place], NP      Future Appointments            In 3 weeks Tobb, Godfrey Pick, DO [facility]   In 2 months Oletta Lamas, Milford Cage, NP [place]           . atorvastatin (LIPITOR) 20 MG tablet 90 tablet 1    Sig: Take 1 tablet (20 mg total) by mouth daily at 6 PM.     Cardiovascular:  Antilipid - Statins Failed - 12/23/2019  4:20 PM      Failed - Total Cholesterol in normal range and within 360 days    Cholesterol  Date Value Ref Range Status  05/20/2019 298 (H) 0 - 200 mg/dL Final         Failed - LDL in normal range and within 360 days    LDL Cholesterol  Date Value Ref Range Status  05/20/2019 232 (H) 0 - 99 mg/dL Final    Comment:           Total Cholesterol/HDL:CHD Risk Coronary Heart Disease Risk Table                     Men   Women  1/2 Average Risk   3.4   3.3  Average Risk       5.0   4.4  2 X Average Risk   9.6   7.1  3 X Average Risk  23.4   11.0        Use the calculated Patient Ratio above and the CHD Risk Table to determine the patient's CHD Risk.        ATP III CLASSIFICATION (LDL):  <100     mg/dL   Optimal  100-129  mg/dL   Near or Above                    Optimal  130-159  mg/dL   Borderline  160-189  mg/dL   High  >190     mg/dL   Very High Performed at [facility] [address], [place],  [id number]          Failed - HDL in normal range and within 360 days    HDL  Date Value Ref Range Status  05/20/2019 31 (L) >40 mg/dL Final         Failed - Triglycerides in normal range and within 360 days    Triglycerides  Date Value Ref Range Status  05/20/2019 173 (H) <150 mg/dL Final         Passed -  Patient is not pregnant      Passed - Valid encounter within last 12 months    Recent Outpatient Visits          3 weeks ago Essential hypertension   [place], Michelle P, NP   4 months ago Essential hypertension   [place], Michelle P, NP   6 months ago Need for Tdap vaccination   [place], [place], NP      Future Appointments            In 3 weeks Tobb, Godfrey Pick, DO [facility]   In 2 months Oletta Lamas, Milford Cage, NP [place]           . predniSONE (STERAPRED UNI-PAK 21 TAB) 10 MG (21) TBPK tablet 21 tablet 0    Sig: Day 1 take 6 pills by mouth once daily; Day 2 take 5 pills once daily; Day 3 take 4 pills once daily; Day 4 take 3 pills once daily; Day 5 take 2 tablets once daily; Day 6 take 1 tablet daily     Not Delegated - Endocrinology:  Oral Corticosteroids Failed - 12/23/2019  4:20 PM      Failed - This refill cannot be delegated      Passed - Last BP in normal range    BP Readings from Last 1 Encounters:  11/29/19 124/82         Passed - Valid encounter within last 6 months    Recent Outpatient Visits          3 weeks ago Essential hypertension   [place], Michelle P, NP   4 months ago Essential hypertension   [place], Michelle P, NP   6 months ago Need for Tdap vaccination   [place], Michelle P, NP      Future Appointments            In 3 weeks Tobb, Godfrey Pick, DO [facility]   In 2 months Oletta Lamas, Milford Cage, NP [place]

## 2019-12-24 MED ORDER — IRON (FERROUS SULFATE) 325 (65 FE) MG PO TABS: 325.0000 mg | ORAL_TABLET | Freq: Every day | ORAL | 6 refills | Status: AC

## 2019-12-24 MED ORDER — ATORVASTATIN CALCIUM 20 MG PO TABS: 20.0000 mg | ORAL_TABLET | Freq: Every day | ORAL | 1 refills | Status: DC

## 2019-12-27 ENCOUNTER — Telehealth (INDEPENDENT_AMBULATORY_CARE_PROVIDER_SITE_OTHER): Payer: Self-pay

## 2019-12-27 NOTE — Telephone Encounter (Signed)
Patient called stating he still has a sinus infection. He is requesting a refill of prednisone. He requested refill on Monday. All other scripts were sent. Advised patient that he would need an appointment. Can Rx be refilled without patient having an appointment? He does not want to go through the weekend feeling bad and unable to sleep. Please advise. Nat Christen, CMA

## 2019-12-31 ENCOUNTER — Other Ambulatory Visit: Payer: Self-pay

## 2019-12-31 ENCOUNTER — Encounter (INDEPENDENT_AMBULATORY_CARE_PROVIDER_SITE_OTHER): Payer: Self-pay | Admitting: Primary Care

## 2019-12-31 ENCOUNTER — Ambulatory Visit (INDEPENDENT_AMBULATORY_CARE_PROVIDER_SITE_OTHER): Payer: Self-pay | Admitting: Primary Care

## 2019-12-31 VITALS — BP 157/97 | HR 68 | Temp 97.5°F | Ht 73.0 in | Wt 207.8 lb

## 2019-12-31 DIAGNOSIS — I10 Essential (primary) hypertension: Secondary | ICD-10-CM

## 2019-12-31 DIAGNOSIS — F17200 Nicotine dependence, unspecified, uncomplicated: Secondary | ICD-10-CM

## 2019-12-31 DIAGNOSIS — J320 Chronic maxillary sinusitis: Secondary | ICD-10-CM

## 2019-12-31 MED ORDER — DM-GUAIFENESIN ER 30-600 MG PO TB12: 1.0000 | ORAL_TABLET | Freq: Two times a day (BID) | ORAL | 1 refills | Status: DC

## 2019-12-31 MED ORDER — AZITHROMYCIN 250 MG PO TABS: ORAL_TABLET | ORAL | 0 refills | Status: DC

## 2019-12-31 MED ORDER — FLUTICASONE PROPIONATE 50 MCG/ACT NA SUSP: 2.0000 | Freq: Every day | NASAL | 0 refills | Status: DC

## 2019-12-31 NOTE — Patient Instructions (Signed)

## 2019-12-31 NOTE — Progress Notes (Signed)
Established Patient Office Visit  Subjective:  Patient ID: Russell Hernandez, male    DOB: 1980/12/12  Age: 39 y.o. MRN: [id number]  CC:  Chief Complaint  Patient presents with  . Sinusitis    HPI [name] is a 39 year old male who presents for sinus congestion.  He felt better when he was on the Z-Pak now that is completed he feels like the nasal congestion is worse at night and it is interfering with working-he works at night and the nights he does not work he is unable to sleep   Past Medical History:  Diagnosis Date  . AKI (acute kidney injury) ([place]) 05/20/2019  . Depressed left ventricular ejection fraction 06/13/2019  . Hypertension   . Hypertensive urgency 05/19/2019  . Hypokalemia 05/20/2019  . Stage 3a chronic kidney disease ([place]) 06/13/2019  . TIA (transient ischemic attack) 05/21/2019    Past Surgical History:  Procedure Laterality Date  . No past surgery      Family History  Problem Relation Age of Onset  . Hypertension Mother   . Hyperlipidemia Mother   . Hypertension Maternal Grandmother   . Heart disease Maternal Grandmother     Social History   Socioeconomic History  . Marital status: Legally Separated    Spouse name: Not on file  . Number of children: Not on file  . Years of education: Not on file  . Highest education level: Not on file  Occupational History  . Not on file  Tobacco Use  . Smoking status: Current Some Day Smoker    Packs/day: 0.50    Types: Cigarettes    Last attempt to quit: 06/06/2019    Years since quitting: 0.5  . Smokeless tobacco: Never Used  Vaping Use  . Vaping Use: Never used  Substance and Sexual Activity  . Alcohol use: Yes    Comment: occ  . Drug use: Yes    Types: Marijuana  . Sexual activity: Not on file  Other Topics Concern  . Not on file  Social History Narrative  . Not on file   Social Determinants of Health   Financial Resource Strain:   . Difficulty of Paying Living Expenses: Not on  file  Food Insecurity:   . Worried About Charity fundraiser in the Last Year: Not on file  . Ran Out of Food in the Last Year: Not on file  Transportation Needs:   . Lack of Transportation (Medical): Not on file  . Lack of Transportation (Non-Medical): Not on file  Physical Activity:   . Days of Exercise per Week: Not on file  . Minutes of Exercise per Session: Not on file  Stress:   . Feeling of Stress : Not on file  Social Connections:   . Frequency of Communication with Friends and Family: Not on file  . Frequency of Social Gatherings with Friends and Family: Not on file  . Attends Religious Services: Not on file  . Active Member of Clubs or Organizations: Not on file  . Attends Archivist Meetings: Not on file  . Marital Status: Not on file  Intimate Partner Violence:   . Fear of Current or Ex-Partner: Not on file  . Emotionally Abused: Not on file  . Physically Abused: Not on file  . Sexually Abused: Not on file    Outpatient Medications Prior to Visit  Medication Sig Dispense Refill  . amLODipine (NORVASC) 10 MG tablet Take 1 tablet (10 mg total) by  mouth daily. 90 tablet 1  . aspirin EC 81 MG EC tablet Take 1 tablet (81 mg total) by mouth daily. 90 tablet 1  . atorvastatin (LIPITOR) 20 MG tablet Take 1 tablet (20 mg total) by mouth daily at 6 PM. 90 tablet 1  . carvedilol (COREG) 25 MG tablet Take 1 tablet (25 mg total) by mouth 2 (two) times daily with a meal. 180 tablet 1  . cloNIDine (CATAPRES) 0.2 MG tablet Take 1 tablet (0.2 mg total) by mouth 2 (two) times daily. 180 tablet 1  . fexofenadine-pseudoephedrine (ALLEGRA-D 24) 180-240 MG 24 hr tablet Take 1 tablet by mouth daily. 30 tablet 1  . Iron, Ferrous Sulfate, 325 (65 Fe) MG TABS Take 325 mg by mouth daily. 30 tablet 6  . nitroGLYCERIN (NITROSTAT) 0.4 MG SL tablet Place 1 tablet (0.4 mg total) under the tongue every 5 (five) minutes as needed for chest pain. 50 tablet 3  . fluticasone (FLONASE) 50 MCG/ACT  nasal spray Place 2 sprays into both nostrils daily for 7 days. 1 g 0  . predniSONE (STERAPRED UNI-PAK 21 TAB) 10 MG (21) TBPK tablet Day 1 take 6 pills by mouth once daily; Day 2 take 5 pills once daily; Day 3 take 4 pills once daily; Day 4 take 3 pills once daily; Day 5 take 2 tablets once daily; Day 6 take 1 tablet daily 21 tablet 0   No facility-administered medications prior to visit.    Allergies  Allergen Reactions  . Lisinopril Anaphylaxis    angioedema  . Onion Other (See Comments)    Sinus congestion    ROS Review of Systems  HENT: Positive for congestion, sinus pressure and sinus pain.   All other systems reviewed and are negative.     Objective:    Physical Exam Vitals reviewed.  Constitutional:      Appearance: Normal appearance.  HENT:     Head: Normocephalic.     Right Ear: Tympanic membrane normal.     Left Ear: Tympanic membrane normal.  Eyes:     Extraocular Movements: Extraocular movements intact.  Cardiovascular:     Rate and Rhythm: Normal rate and regular rhythm.  Pulmonary:     Effort: Pulmonary effort is normal.     Breath sounds: Normal breath sounds.  Abdominal:     General: Bowel sounds are normal.     Palpations: Abdomen is soft.  Musculoskeletal:     Cervical back: Normal range of motion.  Skin:    General: Skin is warm and dry.  Neurological:     Mental Status: He is alert and oriented to person, place, and time.  Psychiatric:        Mood and Affect: Mood normal.        Thought Content: Thought content normal.        Judgment: Judgment normal.     BP (!) 157/97 (BP Location: Right Arm, Patient Position: Sitting, Cuff Size: Normal)   Pulse 68   Temp (!) 97.5 F (36.4 C) (Temporal)   Ht 6\' 1"  (1.854 m)   Wt 207 lb 12.8 oz (94.3 kg)   SpO2 97%   BMI 27.42 kg/m  Wt Readings from Last 3 Encounters:  12/31/19 207 lb 12.8 oz (94.3 kg)  11/29/19 204 lb 3.2 oz (92.6 kg)  11/11/19 210 lb (95.3 kg)     Health Maintenance Due   Topic Date Due  . Hepatitis C Screening  Never done  . COVID-19 Vaccine (1) Never  done  . HIV Screening  Never done    There are no preventive care reminders to display for this patient.  Lab Results  Component Value Date   TSH 1.894 05/20/2019   Lab Results  Component Value Date   WBC 9.9 10/02/2019   HGB 14.4 10/02/2019   HCT 43.6 10/02/2019   MCV 84.5 10/02/2019   PLT 399 10/02/2019   Lab Results  Component Value Date   NA 135 10/02/2019   K 3.2 (L) 10/02/2019   CO2 23 10/02/2019   GLUCOSE 115 (H) 10/02/2019   BUN 19 10/02/2019   CREATININE 1.90 (H) 10/02/2019   BILITOT <0.2 06/11/2019   ALKPHOS 48 06/11/2019   AST 12 06/11/2019   ALT 10 06/11/2019   PROT 7.3 06/11/2019   ALBUMIN 3.9 (L) 06/11/2019   CALCIUM 9.3 10/02/2019   ANIONGAP 14 10/02/2019   Lab Results  Component Value Date   CHOL 298 (H) 05/20/2019   Lab Results  Component Value Date   HDL 31 (L) 05/20/2019   Lab Results  Component Value Date   LDLCALC 232 (H) 05/20/2019   Lab Results  Component Value Date   TRIG 173 (H) 05/20/2019   Lab Results  Component Value Date   CHOLHDL 9.6 05/20/2019   Lab Results  Component Value Date   HGBA1C 6.2 (H) 05/20/2019      Assessment & Plan:  Russell Hernandez was seen today for sinusitis.  Diagnoses and all orders for this visit:  Chronic maxillary sinusitis Felt better after prednisone treatment than a week afterwards s/s returned- stuffiness, difficulty breathing , mucous production and sinus pressure. -     azithromycin (ZITHROMAX) 250 MG tablet; Take 2 tablet day 1 than take 1 tablet until gone -     dextromethorphan-guaiFENesin (MUCINEX DM) 30-600 MG 12hr tablet; Take 1 tablet by mouth 2 (two) times daily. -     fluticasone (FLONASE) 50 MCG/ACT nasal spray; Place 2 sprays into both nostrils daily for 7 days.  Essential hypertension Admits to smoking and counseled on blood pressure goal of less than 130/80, low-sodium, DASH diet, medication  compliance, . Stated he was upset before he got here arguing with baby' mommy. Discussed medication compliance, adverse effects.  Tobacco dependence He is well aware of increased risk for lung cancer and other respiratory diseases recommend cessation.  This will be reminded at each clinical visit.   Meds ordered this encounter  Medications  . azithromycin (ZITHROMAX) 250 MG tablet    Sig: Take 2 tablet day 1 than take 1 tablet until gone    Dispense:  6 tablet    Refill:  0  . dextromethorphan-guaiFENesin (MUCINEX DM) 30-600 MG 12hr tablet    Sig: Take 1 tablet by mouth 2 (two) times daily.    Dispense:  60 tablet    Refill:  1  . fluticasone (FLONASE) 50 MCG/ACT nasal spray    Sig: Place 2 sprays into both nostrils daily for 7 days.    Dispense:  1 g    Refill:  0    Follow-up: Return in about 4 weeks (around 01/28/2020) for Bp check.    [name], NP

## 2020-01-17 ENCOUNTER — Ambulatory Visit (INDEPENDENT_AMBULATORY_CARE_PROVIDER_SITE_OTHER): Payer: Self-pay | Admitting: Cardiology

## 2020-01-17 ENCOUNTER — Other Ambulatory Visit: Payer: Self-pay

## 2020-01-17 ENCOUNTER — Encounter: Payer: Self-pay | Admitting: Cardiology

## 2020-01-17 VITALS — BP 144/76 | HR 64 | Ht 73.0 in | Wt 206.0 lb

## 2020-01-17 DIAGNOSIS — I10 Essential (primary) hypertension: Secondary | ICD-10-CM

## 2020-01-17 DIAGNOSIS — Z720 Tobacco use: Secondary | ICD-10-CM

## 2020-01-17 DIAGNOSIS — N1831 Chronic kidney disease, stage 3a: Secondary | ICD-10-CM

## 2020-01-17 HISTORY — DX: Tobacco use: Z72.0

## 2020-01-17 MED ORDER — CARVEDILOL 25 MG PO TABS: 25.0000 mg | ORAL_TABLET | Freq: Two times a day (BID) | ORAL | 1 refills | Status: DC

## 2020-01-17 MED ORDER — CLONIDINE HCL 0.3 MG PO TABS: 0.3000 mg | ORAL_TABLET | Freq: Two times a day (BID) | ORAL | 1 refills | Status: DC

## 2020-01-17 MED ORDER — ATORVASTATIN CALCIUM 20 MG PO TABS: 20.0000 mg | ORAL_TABLET | Freq: Every day | ORAL | 1 refills | Status: DC

## 2020-01-17 MED ORDER — AMLODIPINE BESYLATE 10 MG PO TABS: 10.0000 mg | ORAL_TABLET | Freq: Every day | ORAL | 1 refills | Status: DC

## 2020-01-17 NOTE — Patient Instructions (Addendum)
Medication Instructions:  Your physician has recommended you make the following change in your medication:  -- INCREASE Clonidine - Take 1 tablet (0.3 mg) by mouth TWICE daily. -- NEW RX SENT *If you need a refill on your cardiac medications before your next appointment, please call your pharmacy*  Follow-Up: At [facility], you and your health needs are our priority.  As part of our continuing mission to provide you with exceptional heart care, we have created designated Provider Care Teams.  These Care Teams include your primary Cardiologist (physician) and Advanced Practice Providers (APPs -  Physician Assistants and Nurse Practitioners) who all work together to provide you with the care you need, when you need it.  We recommend signing up for the patient portal called "MyChart".  Sign up information is provided on this After Visit Summary.  MyChart is used to connect with patients for Virtual Visits (Telemedicine).  Patients are able to view lab/test results, encounter notes, upcoming appointments, etc.  Non-urgent messages can be sent to your provider as well.   To learn more about what you can do with MyChart, go to [email or web address].    Your next appointment:   Your physician recommends that you schedule a follow-up appointment in: 3 MONTHS with Dr. Harriet Masson. -- Monday 04/27/20 at 8:20 am  The format for your next appointment:   In Person with Godfrey Pick Tobb, DO

## 2020-01-17 NOTE — Progress Notes (Signed)
Cardiology Office Note:    Date:  01/17/2020   ID:  Russell Hernandez, DOB 07/06/1980, MRN [id number]  PCP:  [name], NP  Cardiologist:  Berniece Salines, DO  Electrophysiologist:  None   Referring MD: [name], NP   I am doing fine  History of Present Illness:    Russell Hernandez is a 39 y.o. male with a hx of hypertension, chronic kidney disease stage IIIa, tobacco use and history of TIA presents today for follow-up visit.  Last saw the patient on October 23, 2019 at that time his blood pressure was appropriate.  He did tell me he was watching his salt intake we therefore continued his amlodipine 10 mg daily carvedilol 25 mg twice daily, clonidine 0.25 mg twice daily and hydralazine 75 mg 3 times a day.  The patient is here for follow-up he has been taken off his hydralazine.  Here for no complaints at this time.  Past Medical History:  Diagnosis Date  . AKI (acute kidney injury) ([place]) 05/20/2019  . Depressed left ventricular ejection fraction 06/13/2019  . Hypertension   . Hypertensive urgency 05/19/2019  . Hypokalemia 05/20/2019  . Stage 3a chronic kidney disease ([place]) 06/13/2019  . TIA (transient ischemic attack) 05/21/2019    Past Surgical History:  Procedure Laterality Date  . No past surgery      Current Medications: Current Meds  Medication Sig  . amLODipine (NORVASC) 10 MG tablet Take 1 tablet (10 mg total) by mouth daily.  Marland Kitchen aspirin EC 81 MG EC tablet Take 1 tablet (81 mg total) by mouth daily.  Marland Kitchen atorvastatin (LIPITOR) 20 MG tablet Take 1 tablet (20 mg total) by mouth daily at 6 PM.  . azithromycin (ZITHROMAX) 250 MG tablet Take 2 tablet day 1 than take 1 tablet until gone  . carvedilol (COREG) 25 MG tablet Take 1 tablet (25 mg total) by mouth 2 (two) times daily with a meal.  . dextromethorphan-guaiFENesin (MUCINEX DM) 30-600 MG 12hr tablet Take 1 tablet by mouth 2 (two) times daily.  . fexofenadine-pseudoephedrine (ALLEGRA-D 24) 180-240 MG 24 hr  tablet Take 1 tablet by mouth daily.  . Iron, Ferrous Sulfate, 325 (65 Fe) MG TABS Take 325 mg by mouth daily.  . nitroGLYCERIN (NITROSTAT) 0.4 MG SL tablet Place 1 tablet (0.4 mg total) under the tongue every 5 (five) minutes as needed for chest pain.  . [DISCONTINUED] amLODipine (NORVASC) 10 MG tablet Take 1 tablet (10 mg total) by mouth daily.  . [DISCONTINUED] atorvastatin (LIPITOR) 20 MG tablet Take 1 tablet (20 mg total) by mouth daily at 6 PM.  . [DISCONTINUED] carvedilol (COREG) 25 MG tablet Take 1 tablet (25 mg total) by mouth 2 (two) times daily with a meal.  . [DISCONTINUED] cloNIDine (CATAPRES) 0.2 MG tablet Take 1 tablet (0.2 mg total) by mouth 2 (two) times daily.     Allergies:   Lisinopril and Onion   Social History   Socioeconomic History  . Marital status: Legally Separated    Spouse name: Not on file  . Number of children: Not on file  . Years of education: Not on file  . Highest education level: Not on file  Occupational History  . Not on file  Tobacco Use  . Smoking status: Former Smoker    Packs/day: 0.50    Types: Cigarettes    Quit date: 06/06/2019    Years since quitting: 0.6  . Smokeless tobacco: Never Used  Vaping Use  . Vaping  Use: Never used  Substance and Sexual Activity  . Alcohol use: Yes    Comment: occ  . Drug use: Yes    Types: Marijuana  . Sexual activity: Not on file  Other Topics Concern  . Not on file  Social History Narrative  . Not on file   Social Determinants of Health   Financial Resource Strain:   . Difficulty of Paying Living Expenses: Not on file  Food Insecurity:   . Worried About Charity fundraiser in the Last Year: Not on file  . Ran Out of Food in the Last Year: Not on file  Transportation Needs:   . Lack of Transportation (Medical): Not on file  . Lack of Transportation (Non-Medical): Not on file  Physical Activity:   . Days of Exercise per Week: Not on file  . Minutes of Exercise per Session: Not on file    Stress:   . Feeling of Stress : Not on file  Social Connections:   . Frequency of Communication with Friends and Family: Not on file  . Frequency of Social Gatherings with Friends and Family: Not on file  . Attends Religious Services: Not on file  . Active Member of Clubs or Organizations: Not on file  . Attends Archivist Meetings: Not on file  . Marital Status: Not on file     Family History: The patient's family history includes Heart disease in his maternal grandmother; Hyperlipidemia in his mother; Hypertension in his maternal grandmother and mother.  ROS:   Review of Systems  Constitution: Negative for decreased appetite, fever and weight gain.  HENT: Negative for congestion, ear discharge, hoarse voice and sore throat.   Eyes: Negative for discharge, redness, vision loss in right eye and visual halos.  Cardiovascular: Negative for chest pain, dyspnea on exertion, leg swelling, orthopnea and palpitations.  Respiratory: Negative for cough, hemoptysis, shortness of breath and snoring.   Endocrine: Negative for heat intolerance and polyphagia.  Hematologic/Lymphatic: Negative for bleeding problem. Does not bruise/bleed easily.  Skin: Negative for flushing, nail changes, rash and suspicious lesions.  Musculoskeletal: Negative for arthritis, joint pain, muscle cramps, myalgias, neck pain and stiffness.  Gastrointestinal: Negative for abdominal pain, bowel incontinence, diarrhea and excessive appetite.  Genitourinary: Negative for decreased libido, genital sores and incomplete emptying.  Neurological: Negative for brief paralysis, focal weakness, headaches and loss of balance.  Psychiatric/Behavioral: Negative for altered mental status, depression and suicidal ideas.  Allergic/Immunologic: Negative for HIV exposure and persistent infections.    EKGs/Labs/Other Studies Reviewed:    The following studies were reviewed today:   EKG: None today  Recent Labs: 05/20/2019:  B Natriuretic Peptide 99.6; TSH 1.894 06/11/2019: ALT 10 06/13/2019: Magnesium 2.2 10/02/2019: BUN 19; Creatinine, Ser 1.90; Hemoglobin 14.4; Platelets 399; Potassium 3.2; Sodium 135  Recent Lipid Panel    Component Value Date/Time   CHOL 298 (H) 05/20/2019 0627   TRIG 173 (H) 05/20/2019 0627   HDL 31 (L) 05/20/2019 0627   CHOLHDL 9.6 05/20/2019 0627   VLDL 35 05/20/2019 0627   LDLCALC 232 (H) 05/20/2019 0627    Physical Exam:    VS:  BP (!) 144/76   Pulse 64   Ht 6\' 1"  (1.854 m)   Wt 206 lb (93.4 kg)   SpO2 97%   BMI 27.18 kg/m     Wt Readings from Last 3 Encounters:  01/17/20 206 lb (93.4 kg)  12/31/19 207 lb 12.8 oz (94.3 kg)  11/29/19 204 lb 3.2 oz (  92.6 kg)     GEN: Well nourished, well developed in no acute distress HEENT: Normal NECK: No JVD; No carotid bruits LYMPHATICS: No lymphadenopathy CARDIAC: S1S2 noted,RRR, no murmurs, rubs, gallops RESPIRATORY:  Clear to auscultation without rales, wheezing or rhonchi  ABDOMEN: Soft, non-tender, non-distended, +bowel sounds, no guarding. EXTREMITIES: No edema, No cyanosis, no clubbing MUSCULOSKELETAL:  No deformity  SKIN: Warm and dry NEUROLOGIC:  Alert and oriented x 3, non-focal PSYCHIATRIC:  Normal affect, good insight  ASSESSMENT:    1. Primary hypertension   2. Stage 3a chronic kidney disease ([place])   3. Tobacco use    PLAN:      His blood pressure is above target, I like to increase his clonidine to 1 3 mg twice daily, he will remain on carvedilol as well as his amlodipine.  Smoking cessation advised.  Avoid nephrotoxins.  The patient is in agreement with the above plan. The patient left the office in stable condition.  The patient will follow up in 3 months or sooner if needed.   Medication Adjustments/Labs and Tests Ordered: Current medicines are reviewed at length with the patient today.  Concerns regarding medicines are outlined above.  No orders of the defined types were placed in this  encounter.  Meds ordered this encounter  Medications  . amLODipine (NORVASC) 10 MG tablet    Sig: Take 1 tablet (10 mg total) by mouth daily.    Dispense:  90 tablet    Refill:  1  . atorvastatin (LIPITOR) 20 MG tablet    Sig: Take 1 tablet (20 mg total) by mouth daily at 6 PM.    Dispense:  90 tablet    Refill:  1  . carvedilol (COREG) 25 MG tablet    Sig: Take 1 tablet (25 mg total) by mouth 2 (two) times daily with a meal.    Dispense:  180 tablet    Refill:  1  . cloNIDine (CATAPRES) 0.3 MG tablet    Sig: Take 1 tablet (0.3 mg total) by mouth 2 (two) times daily.    Dispense:  180 tablet    Refill:  1    Patient Instructions  Medication Instructions:  Your physician has recommended you make the following change in your medication:  -- INCREASE Clonidine - Take 1 tablet (0.3 mg) by mouth TWICE daily. -- NEW RX SENT *If you need a refill on your cardiac medications before your next appointment, please call your pharmacy*  Follow-Up: At [facility], you and your health needs are our priority.  As part of our continuing mission to provide you with exceptional heart care, we have created designated Provider Care Teams.  These Care Teams include your primary Cardiologist (physician) and Advanced Practice Providers (APPs -  Physician Assistants and Nurse Practitioners) who all work together to provide you with the care you need, when you need it.  We recommend signing up for the patient portal called "MyChart".  Sign up information is provided on this After Visit Summary.  MyChart is used to connect with patients for Virtual Visits (Telemedicine).  Patients are able to view lab/test results, encounter notes, upcoming appointments, etc.  Non-urgent messages can be sent to your provider as well.   To learn more about what you can do with MyChart, go to [email or web address].    Your next appointment:   Your physician recommends that you schedule a follow-up appointment in: 3 MONTHS  with Dr. Harriet Masson. -- Monday 04/27/20 at 8:20 am  The format  for your next appointment:   In Person with Berniece Salines, DO        Adopting a Healthy Lifestyle.  Know what a healthy weight is for you (roughly BMI <25) and aim to maintain this   Aim for 7+ servings of fruits and vegetables daily   65-80+ fluid ounces of water or unsweet tea for healthy kidneys   Limit to max 1 drink of alcohol per day; avoid smoking/tobacco   Limit animal fats in diet for cholesterol and heart health - choose grass fed whenever available   Avoid highly processed foods, and foods high in saturated/trans fats   Aim for low stress - take time to unwind and care for your mental health   Aim for 150 min of moderate intensity exercise weekly for heart health, and weights twice weekly for bone health   Aim for 7-9 hours of sleep daily   When it comes to diets, agreement about the perfect plan isnt easy to find, even among the experts. Experts at the [employer] developed an idea known as the Healthy Eating Plate. Just imagine a plate divided into logical, healthy portions.   The emphasis is on diet quality:   Load up on vegetables and fruits - one-half of your plate: Aim for color and variety, and remember that potatoes dont count.   Go for whole grains - one-quarter of your plate: Whole wheat, barley, wheat berries, quinoa, oats, brown rice, and foods made with them. If you want pasta, go with whole wheat pasta.   Protein power - one-quarter of your plate: Fish, chicken, beans, and nuts are all healthy, versatile protein sources. Limit red meat.   The diet, however, does go beyond the plate, offering a few other suggestions.   Use healthy plant oils, such as olive, canola, soy, corn, sunflower and peanut. Check the labels, and avoid partially hydrogenated oil, which have unhealthy trans fats.   If youre thirsty, drink water. Coffee and tea are good in moderation, but skip sugary  drinks and limit milk and dairy products to one or two daily servings.   The type of carbohydrate in the diet is more important than the amount. Some sources of carbohydrates, such as vegetables, fruits, whole grains, and beans-are healthier than others.   Finally, stay active  Signed, Berniece Salines, DO  01/17/2020 10:07 AM    [place]

## 2020-01-28 ENCOUNTER — Ambulatory Visit (INDEPENDENT_AMBULATORY_CARE_PROVIDER_SITE_OTHER): Payer: Self-pay | Admitting: Primary Care

## 2020-02-27 ENCOUNTER — Telehealth: Payer: Self-pay | Admitting: Cardiology

## 2020-02-27 NOTE — Telephone Encounter (Signed)
New message:      Jeani Hawking from [facility] calling stating that this patient did not show for his apt.

## 2020-02-28 ENCOUNTER — Ambulatory Visit (INDEPENDENT_AMBULATORY_CARE_PROVIDER_SITE_OTHER): Payer: Self-pay | Admitting: Primary Care

## 2020-03-05 ENCOUNTER — Ambulatory Visit (INDEPENDENT_AMBULATORY_CARE_PROVIDER_SITE_OTHER): Payer: Self-pay | Admitting: Primary Care

## 2020-03-12 ENCOUNTER — Encounter (INDEPENDENT_AMBULATORY_CARE_PROVIDER_SITE_OTHER): Payer: Self-pay | Admitting: Primary Care

## 2020-03-12 ENCOUNTER — Other Ambulatory Visit: Payer: Self-pay

## 2020-03-12 ENCOUNTER — Telehealth (INDEPENDENT_AMBULATORY_CARE_PROVIDER_SITE_OTHER): Payer: Self-pay | Admitting: Primary Care

## 2020-03-12 DIAGNOSIS — Z125 Encounter for screening for malignant neoplasm of prostate: Secondary | ICD-10-CM

## 2020-03-12 DIAGNOSIS — I10 Essential (primary) hypertension: Secondary | ICD-10-CM

## 2020-03-12 DIAGNOSIS — G459 Transient cerebral ischemic attack, unspecified: Secondary | ICD-10-CM

## 2020-03-12 DIAGNOSIS — N179 Acute kidney failure, unspecified: Secondary | ICD-10-CM

## 2020-03-12 NOTE — Progress Notes (Signed)
Telephone Note  I connected with Russell Hernandez on 03/12/20 at  4:10 PM EST by telephone and verified that I am speaking with the correct person using two identifiers.  Location: Patient: home Provider: [name] visit done from home   I discussed the limitations, risks, security and privacy concerns of performing an evaluation and management service by telephone and the availability of in person appointments. I also discussed with the patient that there may be a patient responsible charge related to this service. The patient expressed understanding and agreed to proceed.   History of Present Illness: Mr.Russell Hernandez is a 39 year old male who is having a visit for hypertension which is managed by cardiology last visit adjustment was made and Dr. Godfrey Pick Tobb increased his clonidine to .$RemoveBefor'3mg'TcKipTBOieDX$  twice daily.   BP 134/81 today . He does check his bp twice daily   Systolic not gone over 762GBT Diastolic has not gone over 80  Past Medical History:  Diagnosis Date  . AKI (acute kidney injury) ([place]) 05/20/2019  . Depressed left ventricular ejection fraction 06/13/2019  . Hypertension   . Hypertensive urgency 05/19/2019  . Hypokalemia 05/20/2019  . Stage 3a chronic kidney disease ([place]) 06/13/2019  . TIA (transient ischemic attack) 05/21/2019   Observations/Objective: Current Outpatient Medications on File Prior to Visit  Medication Sig Dispense Refill  . amLODipine (NORVASC) 10 MG tablet Take 1 tablet (10 mg total) by mouth daily. 90 tablet 1  . aspirin EC 81 MG EC tablet Take 1 tablet (81 mg total) by mouth daily. 90 tablet 1  . atorvastatin (LIPITOR) 20 MG tablet Take 1 tablet (20 mg total) by mouth daily at 6 PM. 90 tablet 1  . azithromycin (ZITHROMAX) 250 MG tablet Take 2 tablet day 1 than take 1 tablet until gone 6 tablet 0  . carvedilol (COREG) 25 MG tablet Take 1 tablet (25 mg total) by mouth 2 (two) times daily with a meal. 180 tablet 1  . cloNIDine (CATAPRES) 0.3 MG tablet  Take 1 tablet (0.3 mg total) by mouth 2 (two) times daily. 180 tablet 1  . dextromethorphan-guaiFENesin (MUCINEX DM) 30-600 MG 12hr tablet Take 1 tablet by mouth 2 (two) times daily. 60 tablet 1  . fexofenadine-pseudoephedrine (ALLEGRA-D 24) 180-240 MG 24 hr tablet Take 1 tablet by mouth daily. 30 tablet 1  . Iron, Ferrous Sulfate, 325 (65 Fe) MG TABS Take 325 mg by mouth daily. 30 tablet 6  . nitroGLYCERIN (NITROSTAT) 0.4 MG SL tablet Place 1 tablet (0.4 mg total) under the tongue every 5 (five) minutes as needed for chest pain. 50 tablet 3  . fluticasone (FLONASE) 50 MCG/ACT nasal spray Place 2 sprays into both nostrils daily for 7 days. 1 g 0  . [DISCONTINUED] hydrochlorothiazide (HYDRODIURIL) 25 MG tablet Take 1 tablet (25 mg total) by mouth daily. 30 tablet 2  . [DISCONTINUED] lisinopril (PRINIVIL,ZESTRIL) 10 MG tablet Take 1 tablet (10 mg total) by mouth daily. 30 tablet 0  . [DISCONTINUED] omeprazole (PRILOSEC) 20 MG capsule Take 1 capsule (20 mg total) by mouth daily. 30 capsule 0  . [DISCONTINUED] ranitidine (ZANTAC) 150 MG tablet Take 1 tablet (150 mg total) by mouth 2 (two) times daily. 60 tablet 0   No current facility-administered medications on file prior to visit.   Assessment and Plan: Diagnoses and all orders for this visit:  AKI (acute kidney injury) ([place]) -     CBC with Differential/Platelet; Future -     CMP14+EGFR; Future  Primary hypertension Controlled and managed by cardiology. Patient would like to f/u with them every 6 months  -     CBC with Differential/Platelet; Future -     CMP14+EGFR; Future  TIA (transient ischemic attack) -     Lipid Panel; Future  Screening for prostate cancer -     PSA; Future    Follow Up Instructions:    I discussed the assessment and treatment plan with the patient. The patient was provided an opportunity to ask questions and all were answered. The patient agreed with the plan and demonstrated an understanding of the  instructions.   The patient was advised to call back or seek an in-person evaluation if the symptoms worsen or if the condition fails to improve as anticipated.  I provided 22 minutes of non-face-to-face time during this encounter.   [name], NP

## 2020-03-12 NOTE — Progress Notes (Signed)
BP 134/81 today  Pt checks BP a couple times a day  Systolic number  has not gone over 151 Diastolic has not gone over 80   Pt has received COVID vaccine but did not have card available at time of call.   I connected with  Russell Hernandez on 03/12/20 by a video enabled telemedicine application and verified that I am speaking with the correct person using two identifiers.   I discussed the limitations of evaluation and management by telemedicine. The patient expressed understanding and agreed to proceed.

## 2020-04-27 ENCOUNTER — Ambulatory Visit: Payer: Self-pay | Admitting: Cardiology

## 2020-06-10 ENCOUNTER — Ambulatory Visit (INDEPENDENT_AMBULATORY_CARE_PROVIDER_SITE_OTHER): Payer: Self-pay | Admitting: Primary Care

## 2020-07-20 ENCOUNTER — Other Ambulatory Visit (INDEPENDENT_AMBULATORY_CARE_PROVIDER_SITE_OTHER): Payer: Self-pay | Admitting: Primary Care

## 2020-07-20 NOTE — Telephone Encounter (Signed)
Notes to clinic:  Medication filled by a different provider Review for continued use and review for refill     Requested Prescriptions  Pending Prescriptions Disp Refills   amLODipine (NORVASC) 10 MG tablet 90 tablet 1    Sig: Take 1 tablet (10 mg total) by mouth daily.      Cardiovascular:  Calcium Channel Blockers Failed - 07/20/2020  9:26 AM      Failed - Last BP in normal range    BP Readings from Last 1 Encounters:  01/17/20 (!) 144/76          Passed - Valid encounter within last 6 months    Recent Outpatient Visits           4 months ago AKI (acute kidney injury) ([place])   [place] [name], NP   6 months ago Chronic maxillary sinusitis   [place] [facility] [name], NP   7 months ago Essential hypertension   [place] [facility] [name], NP   11 months ago Essential hypertension   [place] [name], NP   1 year ago Need for Tdap vaccination   [place] Juluis Mire P, NP                  atorvastatin (LIPITOR) 20 MG tablet 90 tablet 1    Sig: Take 1 tablet (20 mg total) by mouth daily at 6 PM.      Cardiovascular:  Antilipid - Statins Failed - 07/20/2020  9:26 AM      Failed - Total Cholesterol in normal range and within 360 days    Cholesterol  Date Value Ref Range Status  05/20/2019 298 (H) 0 - 200 mg/dL Final          Failed - LDL in normal range and within 360 days    LDL Cholesterol  Date Value Ref Range Status  05/20/2019 232 (H) 0 - 99 mg/dL Final    Comment:           Total Cholesterol/HDL:CHD Risk Coronary Heart Disease Risk Table                     Men   Women  1/2 Average Risk   3.4   3.3  Average Risk       5.0   4.4  2 X Average Risk   9.6   7.1  3 X Average Risk  23.4   11.0        Use the calculated Patient Ratio above and the CHD Risk Table to determine the patient's CHD  Risk.        ATP III CLASSIFICATION (LDL):  <100     mg/dL   Optimal  100-129  mg/dL   Near or Above                    Optimal  130-159  mg/dL   Borderline  160-189  mg/dL   High  >190     mg/dL   Very High Performed at [facility] [address] [place], [place] [address]           Failed - HDL in normal range and within 360 days    HDL  Date Value Ref Range Status  05/20/2019 31 (L) >40 mg/dL Final  Failed - Triglycerides in normal range and within 360 days    Triglycerides  Date Value Ref Range Status  05/20/2019 173 (H) <150 mg/dL Final          Passed - Patient is not pregnant      Passed - Valid encounter within last 12 months    Recent Outpatient Visits           4 months ago AKI (acute kidney injury) ([place])   [place], [place], NP   6 months ago Chronic maxillary sinusitis   [place] [name], NP   7 months ago Essential hypertension   [place], [place], NP   11 months ago Essential hypertension   [place], Michelle P, NP   1 year ago Need for Tdap vaccination   [place] Juluis Mire P, NP                  carvedilol (COREG) 25 MG tablet 180 tablet 1    Sig: Take 1 tablet (25 mg total) by mouth 2 (two) times daily with a meal.      Cardiovascular:  Beta Blockers Failed - 07/20/2020  9:26 AM      Failed - Last BP in normal range    BP Readings from Last 1 Encounters:  01/17/20 (!) 144/76          Passed - Last Heart Rate in normal range    Pulse Readings from Last 1 Encounters:  01/17/20 64          Passed - Valid encounter within last 6 months    Recent Outpatient Visits           4 months ago AKI (acute kidney injury) ([place])   [place], Michelle P, NP   6 months ago Chronic maxillary sinusitis   [facility] [name], NP   7 months ago Essential hypertension   [place], Michelle P, NP   11 months ago Essential hypertension   [place] [name], NP   1 year ago Need for Tdap vaccination   [place] Juluis Mire P, NP                  cloNIDine (CATAPRES) 0.3 MG tablet 180 tablet 1    Sig: Take 1 tablet (0.3 mg total) by mouth 2 (two) times daily.      Cardiovascular:  Alpha-2 Agonists Failed - 07/20/2020  9:26 AM      Failed - Last BP in normal range    BP Readings from Last 1 Encounters:  01/17/20 (!) 144/76          Passed - Last Heart Rate in normal range    Pulse Readings from Last 1 Encounters:  01/17/20 64          Passed - Valid encounter within last 6 months    Recent Outpatient Visits           4 months ago AKI (acute kidney injury) ([place])   [place] [name], NP   6 months ago Chronic maxillary sinusitis   [place] [name], NP   7 months ago Essential hypertension   [place]  [facility] [name], NP   11 months ago Essential hypertension   [place] [name], NP   1 year ago Need for Tdap vaccination   [place] [name], NP

## 2020-07-20 NOTE — Telephone Encounter (Signed)
Medication Refill - Medication: amLODipine (NORVASC) 10 MG tablet atorvastatin (LIPITOR) 20 MG tablet carvedilol (COREG) 25 MG tablet  cloNIDine (CATAPRES) 0.3 MG tablet     Preferred Pharmacy (with phone number or street name):  [facility] Phone:  [phone]  Fax:  [phone]       Agent: Please be advised that RX refills may take up to 3 business days. We ask that you follow-up with your pharmacy.

## 2020-07-21 ENCOUNTER — Other Ambulatory Visit: Payer: Self-pay | Admitting: Cardiology

## 2020-07-21 ENCOUNTER — Telehealth: Payer: Self-pay | Admitting: Cardiology

## 2020-07-21 ENCOUNTER — Telehealth (INDEPENDENT_AMBULATORY_CARE_PROVIDER_SITE_OTHER): Payer: Self-pay | Admitting: Primary Care

## 2020-07-21 MED ORDER — CLONIDINE HCL 0.3 MG PO TABS: 0.3000 mg | ORAL_TABLET | Freq: Two times a day (BID) | ORAL | 0 refills | Status: DC

## 2020-07-21 MED ORDER — CARVEDILOL 25 MG PO TABS: 25.0000 mg | ORAL_TABLET | Freq: Two times a day (BID) | ORAL | 0 refills | Status: DC

## 2020-07-21 MED ORDER — AMLODIPINE BESYLATE 10 MG PO TABS: 10.0000 mg | ORAL_TABLET | Freq: Every day | ORAL | 0 refills | Status: DC

## 2020-07-21 MED ORDER — ATORVASTATIN CALCIUM 20 MG PO TABS: 20.0000 mg | ORAL_TABLET | Freq: Every day | ORAL | 0 refills | Status: DC

## 2020-07-21 NOTE — Telephone Encounter (Signed)
Pt called back upset and a bit confused about what he was told. I advised pt that Rx refills were denied due to being prescribed by Dr. Harriet Masson and I was able to get Phil from [facility] to send refills to preferred  [facility] for the pt/

## 2020-07-21 NOTE — Telephone Encounter (Signed)
Pt c/o medication issue:  1. Name of Medication: amLODipine (NORVASC) 10 MG tablet(Expired); atorvastatin (LIPITOR) 20 MG tablet; carvedilol (COREG) 25 MG tablet; cloNIDine (CATAPRES) 0.3 MG tablet  2. How are you currently taking this medication (dosage and times per day)? As written   3. Are you having a reaction (difficulty breathing--STAT)?  No   4. What is your medication issue? Patient currently is out of all medication. Went to PCP to get filled but was denied due to prescription filled by Dr. Harriet Masson. Please send in prescription for patient asap due to patient having none left.  Please send to [place]

## 2020-07-21 NOTE — Telephone Encounter (Signed)
FYI- [facility] sent in patient rx.

## 2020-07-21 NOTE — Telephone Encounter (Signed)
**  DISREGARD** pt declined appt offer and is upset that the office will not see him because he has not been since October of 2021.

## 2020-07-21 NOTE — Telephone Encounter (Signed)
Medication Refill - Medication: atorvastatin (LIPITOR) 20 MG tablet  carvedilol (COREG) 25 MG tablet cloNIDine (CATAPRES) 0.3 MG tablet  Pt is requesting a refill   Has the patient contacted their pharmacy? No. (Agent: If no, request that the patient contact the pharmacy for the refill.) (Agent: If yes, when and what did the pharmacy advise?)  Preferred Pharmacy (with phone number or street name):  [facility] [address]  [address] [place] [address]  Phone: [phone] Fax: [phone]     Agent: Please be advised that RX refills may take up to 3 business days. We ask that you follow-up with your pharmacy.

## 2020-07-21 NOTE — Telephone Encounter (Signed)
Refills sent in for a 30 day supply with note to have patient schedule follow up visit for further refills.

## 2020-07-22 ENCOUNTER — Telehealth (INDEPENDENT_AMBULATORY_CARE_PROVIDER_SITE_OTHER): Payer: Self-pay

## 2020-07-22 NOTE — Telephone Encounter (Signed)
Copied from [place] [id number]. Topic: General - Call Back - No Documentation >> Jul 21, 2020 12:14 PM Loma Boston wrote: Reason for CRM  Pls FU by nurse for call to Pt. 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 He does not have name of all meds that he wants, so could someone pls return the call at [phone], office at lunch

## 2020-08-11 ENCOUNTER — Other Ambulatory Visit (INDEPENDENT_AMBULATORY_CARE_PROVIDER_SITE_OTHER): Payer: Self-pay | Admitting: Primary Care

## 2020-08-11 NOTE — Telephone Encounter (Signed)
Notes to clinic: Patient will run out of medications before appointment on 09/01/2020  Medications were last filled by a different provider     Requested Prescriptions  Pending Prescriptions Disp Refills   amLODipine (NORVASC) 10 MG tablet 30 tablet 0    Sig: Take 1 tablet (10 mg total) by mouth daily.      Cardiovascular:  Calcium Channel Blockers Failed - 08/11/2020 11:23 AM      Failed - Last BP in normal range    BP Readings from Last 1 Encounters:  01/17/20 (!) 144/76          Passed - Valid encounter within last 6 months    Recent Outpatient Visits           5 months ago AKI (acute kidney injury) ([place])   [place] [name], NP   7 months ago Chronic maxillary sinusitis   [place] [name], NP   8 months ago Essential hypertension   [place] [name], NP   11 months ago Essential hypertension   [place] [name], NP   1 year ago Need for Tdap vaccination   [place] [name], NP       Future Appointments             In 3 weeks Oletta Lamas, Milford Cage, NP [facility]               atorvastatin (LIPITOR) 20 MG tablet 30 tablet 0    Sig: Take 1 tablet (20 mg total) by mouth daily at 6 PM.      Cardiovascular:  Antilipid - Statins Failed - 08/11/2020 11:23 AM      Failed - Total Cholesterol in normal range and within 360 days    Cholesterol  Date Value Ref Range Status  05/20/2019 298 (H) 0 - 200 mg/dL Final          Failed - LDL in normal range and within 360 days    LDL Cholesterol  Date Value Ref Range Status  05/20/2019 232 (H) 0 - 99 mg/dL Final    Comment:           Total Cholesterol/HDL:CHD Risk Coronary Heart Disease Risk Table                     Men   Women  1/2 Average Risk   3.4   3.3  Average Risk       5.0   4.4  2 X Average Risk   9.6    7.1  3 X Average Risk  23.4   11.0        Use the calculated Patient Ratio above and the CHD Risk Table to determine the patient's CHD Risk.        ATP III CLASSIFICATION (LDL):  <100     mg/dL   Optimal  100-129  mg/dL   Near or Above                    Optimal  130-159  mg/dL   Borderline  160-189  mg/dL   High  >190     mg/dL   Very High Performed at [facility] [address], [place], [place] [address]           Failed - HDL in normal  range and within 360 days    HDL  Date Value Ref Range Status  05/20/2019 31 (L) >40 mg/dL Final          Failed - Triglycerides in normal range and within 360 days    Triglycerides  Date Value Ref Range Status  05/20/2019 173 (H) <150 mg/dL Final          Passed - Patient is not pregnant      Passed - Valid encounter within last 12 months    Recent Outpatient Visits           5 months ago AKI (acute kidney injury) ([place])   [place], [place], NP   7 months ago Chronic maxillary sinusitis   [place] Juluis Mire P, NP   8 months ago Essential hypertension   [place], [place], NP   11 months ago Essential hypertension   [place], Michelle P, NP   1 year ago Need for Tdap vaccination   [place], Michelle P, NP       Future Appointments             In 3 weeks Oletta Lamas, Milford Cage, NP [facility]               carvedilol (COREG) 25 MG tablet 60 tablet 0    Sig: Take 1 tablet (25 mg total) by mouth 2 (two) times daily with a meal.      Cardiovascular:  Beta Blockers Failed - 08/11/2020 11:23 AM      Failed - Last BP in normal range    BP Readings from Last 1 Encounters:  01/17/20 (!) 144/76          Passed - Last Heart Rate in normal range    Pulse Readings from Last 1 Encounters:  01/17/20 64          Passed  - Valid encounter within last 6 months    Recent Outpatient Visits           5 months ago AKI (acute kidney injury) ([place])   [place], Michelle P, NP   7 months ago Chronic maxillary sinusitis   [facility] [name], NP   8 months ago Essential hypertension   [place], Michelle P, NP   11 months ago Essential hypertension   [place], Michelle P, NP   1 year ago Need for Tdap vaccination   [place] [name], NP       Future Appointments             In 3 weeks Oletta Lamas, Milford Cage, NP [facility]               cloNIDine (CATAPRES) 0.3 MG tablet 60 tablet 0    Sig: Take 1 tablet (0.3 mg total) by mouth 2 (two) times daily. Please schedule office visit for further refills      Cardiovascular:  Alpha-2 Agonists Failed - 08/11/2020 11:23 AM      Failed - Last BP in normal range    BP Readings from Last 1 Encounters:  01/17/20 (!) 144/76          Passed - Last Heart Rate in normal range  Pulse Readings from Last 1 Encounters:  01/17/20 64          Passed - Valid encounter within last 6 months    Recent Outpatient Visits           5 months ago AKI (acute kidney injury) ([place])   [place] [name], NP   7 months ago Chronic maxillary sinusitis   [facility] [name], NP   8 months ago Essential hypertension   [place], Michelle P, NP   11 months ago Essential hypertension   [place] [name], NP   1 year ago Need for Tdap vaccination   [place], NP       Future Appointments             In 3 weeks Oletta Lamas Milford Cage, NP [place]

## 2020-08-11 NOTE — Telephone Encounter (Signed)
Pt scheduled appt for the next available slot. He says he will run out these medications listed below by the end of the week. He says he may have enough until Friday but that is all. Please advise, he is requesting enough refills to last him until scheduled time.   amLODipine (NORVASC) 10 MG tablet atorvastatin (LIPITOR) 20 MG tablet carvedilol (COREG) 25 MG tablet cloNIDine (CATAPRES) 0.3 MG tablet   [place], [place] [address]  Phone: [phone] Fax: [phone]

## 2020-08-12 MED ORDER — ATORVASTATIN CALCIUM 20 MG PO TABS: 20.0000 mg | ORAL_TABLET | Freq: Every day | ORAL | 0 refills | Status: DC

## 2020-08-12 MED ORDER — CLONIDINE HCL 0.3 MG PO TABS: 0.3000 mg | ORAL_TABLET | Freq: Two times a day (BID) | ORAL | 0 refills | Status: DC

## 2020-08-12 MED ORDER — AMLODIPINE BESYLATE 10 MG PO TABS: 10.0000 mg | ORAL_TABLET | Freq: Every day | ORAL | 0 refills | Status: DC

## 2020-08-12 MED ORDER — CARVEDILOL 25 MG PO TABS: 25.0000 mg | ORAL_TABLET | Freq: Two times a day (BID) | ORAL | 0 refills | Status: DC

## 2020-09-01 ENCOUNTER — Telehealth (INDEPENDENT_AMBULATORY_CARE_PROVIDER_SITE_OTHER): Payer: Self-pay | Admitting: Primary Care

## 2020-09-01 ENCOUNTER — Other Ambulatory Visit: Payer: Self-pay

## 2020-09-01 DIAGNOSIS — E876 Hypokalemia: Secondary | ICD-10-CM

## 2020-09-01 DIAGNOSIS — I10 Essential (primary) hypertension: Secondary | ICD-10-CM

## 2020-09-01 DIAGNOSIS — F17200 Nicotine dependence, unspecified, uncomplicated: Secondary | ICD-10-CM

## 2020-09-01 NOTE — Progress Notes (Signed)
[facility]  Telephone Note  I connected with Russell Hernandez, on 09/01/2020 at 9:38 AM by telephone  and verified that I am speaking with the correct person using two identifiers.   Consent: I discussed the limitations, risks, security and privacy concerns of performing an evaluation and management service by telephone and the availability of in person appointments. I also discussed with the patient that there may be a patient responsible charge related to this service. The patient expressed understanding and agreed to proceed.   Location of Patient: Home   Location of Provider: [facility]   Persons participating in Telemedicine visit: Lenise Herald,  NP  History of Present Illness: [name]. [place] hypertension evaluation, Denies shortness of breath, headaches, chest pain  or lower extremity edema, sudden onset, vision changes, unilateral weakness, dizziness, paresthesias . Sometimes has chest discomfort but no s/s of MI. 08/28/20 patient was seen at [facility]  EMS transported BP was 180/110. Patient said he also went ahead and took his morning BP meds after noticing BP was high. NAD on arrival. Patient reports adherence with medications.   Past Medical History:  Diagnosis Date   AKI (acute kidney injury) ([place]) 05/20/2019   Depressed left ventricular ejection fraction 06/13/2019   Hypertension    Hypertensive urgency 05/19/2019   Hypokalemia 05/20/2019   Stage 3a chronic kidney disease ([place]) 06/13/2019   TIA (transient ischemic attack) 05/21/2019   Allergies  Allergen Reactions   Lisinopril Anaphylaxis    angioedema   Onion Other (See Comments)    Sinus congestion    Current Outpatient Medications on File Prior to Visit  Medication Sig Dispense Refill   amLODipine (NORVASC) 10 MG tablet Take 1 tablet (10 mg total) by mouth daily. 30 tablet 0   aspirin EC 81 MG  EC tablet Take 1 tablet (81 mg total) by mouth daily. 90 tablet 1   atorvastatin (LIPITOR) 20 MG tablet Take 1 tablet (20 mg total) by mouth daily at 6 PM. 30 tablet 0   azithromycin (ZITHROMAX) 250 MG tablet Take 2 tablet day 1 than take 1 tablet until gone 6 tablet 0   carvedilol (COREG) 25 MG tablet Take 1 tablet (25 mg total) by mouth 2 (two) times daily with a meal. 60 tablet 0   cloNIDine (CATAPRES) 0.3 MG tablet Take 1 tablet (0.3 mg total) by mouth 2 (two) times daily. Please schedule office visit for further refills 60 tablet 0   dextromethorphan-guaiFENesin (MUCINEX DM) 30-600 MG 12hr tablet Take 1 tablet by mouth 2 (two) times daily. 60 tablet 1   fexofenadine-pseudoephedrine (ALLEGRA-D 24) 180-240 MG 24 hr tablet Take 1 tablet by mouth daily. 30 tablet 1   fluticasone (FLONASE) 50 MCG/ACT nasal spray Place 2 sprays into both nostrils daily for 7 days. 1 g 0   Iron, Ferrous Sulfate, 325 (65 Fe) MG TABS Take 325 mg by mouth daily. 30 tablet 6   nitroGLYCERIN (NITROSTAT) 0.4 MG SL tablet Place 1 tablet (0.4 mg total) under the tongue every 5 (five) minutes as needed for chest pain. 50 tablet 3   [DISCONTINUED] hydrochlorothiazide (HYDRODIURIL) 25 MG tablet Take 1 tablet (25 mg total) by mouth daily. 30 tablet 2   [DISCONTINUED] lisinopril (PRINIVIL,ZESTRIL) 10 MG tablet Take 1 tablet (10 mg total) by mouth daily. 30 tablet 0   [DISCONTINUED] omeprazole (PRILOSEC) 20 MG capsule Take 1 capsule (20 mg total) by mouth daily.  30 capsule 0   [DISCONTINUED] ranitidine (ZANTAC) 150 MG tablet Take 1 tablet (150 mg total) by mouth 2 (two) times daily. 60 tablet 0   No current facility-administered medications on file prior to visit.    Observations/Objective: There were no vitals taken for this visit.  Review of Systems  All other systems reviewed and are negative.  Assessment and Plan: Diagnoses and all orders for this visit:  Tobacco dependence - I have recommended complete cessation of  tobacco use. I have discussed various options available for assistance with tobacco cessation including over the counter methods (Nicotine gum, patch and lozenges). We also discussed prescription options (Chantix, Nicotine Inhaler / Nasal Spray). The patient is not interested in pursuing any prescription tobacco cessation options at this time. - Patient declines at this time.  Essential hypertension Counseled on blood pressure goal of less than 130/80, low-sodium, DASH diet, medication compliance, 150 minutes of moderate intensity exercise per week. Remains uncontrolled referred back to cardiologist. Refilled HTN medication for 30 days.  Discussed medication compliance, adverse effects.   Hypokalemia  K+ 3.4 in the hospital- reck at follow up increase potassium in diet and sent in K+ 17meq for 30 days   Follow Up Instructions:    I discussed the assessment and treatment plan with the patient. The patient was provided an opportunity to ask questions and all were answered. The patient agreed with the plan and demonstrated an understanding of the instructions.   The patient was advised to call back or seek an in-person evaluation if the symptoms worsen or if the condition fails to improve as anticipated.     I provided 12  minutes total of non-face-to-face time during this encounter including median intraservice time, reviewing previous notes, investigations, ordering medications, medical decision making, coordinating care and patient verbalized understanding at the end of the visit.    This note has been created with Surveyor, quantity. Any transcriptional errors are unintentional.   [name], NP 09/01/2020, 9:38 AM

## 2020-09-04 ENCOUNTER — Ambulatory Visit: Payer: Self-pay

## 2020-09-04 NOTE — Telephone Encounter (Signed)
Pt. Reports he had a visit with PCP this week and "she was going to call me back about what medicine to take for my BP." States he called yesterday and "no one called me back." Called EMS today because "I had chest pain. My BP was 170/101. EKG was fine and they said I didn't need to go to ED." No answer in the practice. Instructed to go to ED for worsening of symptoms. Please advise pt.

## 2020-09-04 NOTE — Telephone Encounter (Signed)
Answer Assessment - Initial Assessment Questions 1. NAME of MEDICATION: "What medicine are you calling about?"     N/a 2. QUESTION: "What is your question?" (e.g., double dose of medicine, side effect)     Pt. States he had a visit this week and has questions about medications 3. PRESCRIBING HCP: "Who prescribed it?" Reason: if prescribed by specialist, call should be referred to that group.     [place] 4. SYMPTOMS: "Do you have any symptoms?"     Elevated BP 170/101 5. SEVERITY: If symptoms are present, ask "Are they mild, moderate or severe?"     170/101 6. PREGNANCY:  "Is there any chance that you are pregnant?" "When was your last menstrual period?"     N/a  Protocols used: Medication Question Call-A-AH

## 2020-09-07 ENCOUNTER — Encounter (INDEPENDENT_AMBULATORY_CARE_PROVIDER_SITE_OTHER): Payer: Self-pay | Admitting: Primary Care

## 2020-09-07 ENCOUNTER — Telehealth (INDEPENDENT_AMBULATORY_CARE_PROVIDER_SITE_OTHER): Payer: Self-pay

## 2020-09-07 ENCOUNTER — Telehealth: Payer: Self-pay | Admitting: Cardiology

## 2020-09-07 ENCOUNTER — Telehealth (INDEPENDENT_AMBULATORY_CARE_PROVIDER_SITE_OTHER): Payer: Self-pay | Admitting: Primary Care

## 2020-09-07 MED ORDER — CARVEDILOL 25 MG PO TABS: 25.0000 mg | ORAL_TABLET | Freq: Two times a day (BID) | ORAL | 0 refills | Status: DC

## 2020-09-07 MED ORDER — POTASSIUM CHLORIDE CRYS ER 20 MEQ PO TBCR: 20.0000 meq | EXTENDED_RELEASE_TABLET | Freq: Every day | ORAL | 0 refills | Status: AC

## 2020-09-07 MED ORDER — CLONIDINE HCL 0.3 MG PO TABS: 0.3000 mg | ORAL_TABLET | Freq: Two times a day (BID) | ORAL | 0 refills | Status: DC

## 2020-09-07 MED ORDER — AMLODIPINE BESYLATE 10 MG PO TABS
10.0000 mg | ORAL_TABLET | Freq: Every day | ORAL | 0 refills | Status: DC
Start: 2020-09-07 — End: 2020-10-12

## 2020-09-07 NOTE — Telephone Encounter (Signed)
Copied from [place] [id number]. Topic: General - Other >> Sep 03, 2020  3:19 PM Leward Quan A wrote: Reason for CRM: Patient called in asking to speak to Juluis Mire say that he have been waiting on a call for the past 2 days about his medication and now he does not know what to do. Please call patient at Ph# [phone]

## 2020-09-07 NOTE — Telephone Encounter (Signed)
Spoke to patient. Informed him that he is overdue for a visit, he asked me to schedule him he wants to discuss his meds with her. Appointment scheduled.

## 2020-09-07 NOTE — Telephone Encounter (Signed)
Pt is calling to see if Dr. Harriet Masson is going to change his meds. Pt is cursing on the phone and hollering at me telling me that I can not do my job. Please advise pt further

## 2020-09-07 NOTE — Telephone Encounter (Signed)
Pt is calling again and would like  michelle to return his call today. Pt is aware office is close for lunch. Pt states if he does not hear from [facility] he will consult a lawyer

## 2020-09-07 NOTE — Telephone Encounter (Signed)
Pt is calling to speak to [place]. Pt is using extreme vulgar language with the agent and states that he will get an attorney and file a Geophysicist/field seismologist. Pt is upset that his medication was not sent last week. Advised that he medication was sent today.   And -pt reports that the medications that amLODipine (NORVASC) 10 MG tablet [[id number]] ,carvedilol (COREG) 25 MG tablet [[id number]] , cloNIDine (CATAPRES) 0.3 MG tablet [[id number]] are in effective.   Patient is stating that there is a communication problem.  That office staff is not on the same page with Sharyn Lull. And all staff need to sit next to each other to sync each other computers.   Pt asked for agents First and last name when last name was declined to be be given to the patient. He stated when he was done he would know everyone's first and last name at the office. And is wanting answers. Please advise CB- [phone]

## 2020-09-07 NOTE — Telephone Encounter (Signed)
Mr. Marrow was called today to inform him his potassium was 3.4 and will place him on K-Lor 20 mEq #30 and repeat BMP.  He became very upset regarding having to go to the emergency room on several occasions for his blood pressure.  He was given medication on 08/12/2020 and out stated he was taking the medication however he felt he needed to, to control his blood pressure because I was not able to do it.  Sent message to cardiologist regarding patient blood pressure and response was given they are only able to control his blood pressure if he comes to his appointments.  Provided patient with cardiologist phone number and name and she will be the one to manage his blood pressure.

## 2020-09-08 ENCOUNTER — Telehealth: Payer: Self-pay | Admitting: Primary Care

## 2020-09-08 NOTE — Telephone Encounter (Signed)
Called patient regarding his concern he had. Patient states he did have concerns but states that it was resolved. Patient was also informed that his medications were called in and he is going to call his pharmacy to see if his medications are ready.

## 2020-09-11 ENCOUNTER — Other Ambulatory Visit (INDEPENDENT_AMBULATORY_CARE_PROVIDER_SITE_OTHER): Payer: Self-pay | Admitting: Primary Care

## 2020-09-11 DIAGNOSIS — I10 Essential (primary) hypertension: Secondary | ICD-10-CM

## 2020-09-11 NOTE — Telephone Encounter (Signed)
Copied from [place] [id number]. Topic: Quick Communication - Rx Refill/Question >> Sep 11, 2020  3:01 PM Lenon Curt, [place] A wrote: Medication: amLODipine (NORVASC) 10 MG tablet   carvedilol (COREG) 25 MG tablet   cloNIDine (CATAPRES) 0.3 MG tablet   atorvastatin (LIPITOR) 20 MG tablet   Has the patient contacted their pharmacy? No. (Agent: If no, request that the patient contact the pharmacy for the refill.) (Agent: If yes, when and what did the pharmacy advise?)  Preferred Pharmacy (with phone number or street name): [facility] [address]  Phone:  [phone] Fax:  [phone]  Agent: Please be advised that RX refills may take up to 3 business days. We ask that you follow-up with your pharmacy.

## 2020-09-11 NOTE — Telephone Encounter (Signed)
Pharmacy had scripts on file

## 2020-09-16 ENCOUNTER — Telehealth (INDEPENDENT_AMBULATORY_CARE_PROVIDER_SITE_OTHER): Payer: Self-pay

## 2020-09-16 NOTE — Telephone Encounter (Signed)
Copied from [place] [id number]. Topic: General - Other >> Sep 11, 2020  2:58 PM Tessa Lerner A wrote: Reason for CRM: Patient would like to be contacted by a member of staff about issues with coordinating prescription pick up   The patient has attempted to contact the practice multiple times to prevent further miscommunications regarding their prescriptions  Please contact to further advise when possible

## 2020-09-17 ENCOUNTER — Encounter: Payer: Self-pay | Admitting: Cardiology

## 2020-09-17 ENCOUNTER — Other Ambulatory Visit: Payer: Self-pay

## 2020-09-17 ENCOUNTER — Ambulatory Visit (INDEPENDENT_AMBULATORY_CARE_PROVIDER_SITE_OTHER): Payer: Self-pay | Admitting: Cardiology

## 2020-09-17 VITALS — BP 132/84 | HR 64 | Ht 73.0 in | Wt 216.0 lb

## 2020-09-17 DIAGNOSIS — N1831 Chronic kidney disease, stage 3a: Secondary | ICD-10-CM

## 2020-09-17 DIAGNOSIS — E782 Mixed hyperlipidemia: Secondary | ICD-10-CM

## 2020-09-17 DIAGNOSIS — R079 Chest pain, unspecified: Secondary | ICD-10-CM

## 2020-09-17 DIAGNOSIS — I10 Essential (primary) hypertension: Secondary | ICD-10-CM

## 2020-09-17 DIAGNOSIS — Z720 Tobacco use: Secondary | ICD-10-CM

## 2020-09-17 HISTORY — DX: Chest pain, unspecified: R07.9

## 2020-09-17 MED ORDER — ISOSORBIDE MONONITRATE ER 30 MG PO TB24: 30.0000 mg | ORAL_TABLET | Freq: Every day | ORAL | 3 refills | Status: DC

## 2020-09-17 NOTE — Patient Instructions (Signed)
Medication Instructions:  Your physician has recommended you make the following change in your medication:  START: Imdur 30 mg once daily  *If you need a refill on your cardiac medications before your next appointment, please call your pharmacy*   Lab Work: None If you have labs (blood work) drawn today and your tests are completely normal, you will receive your results only by: [place] (if you have MyChart) OR A paper copy in the mail If you have any lab test that is abnormal or we need to change your treatment, we will call you to review the results.   Testing/Procedures: Your physician has requested that you have en exercise stress myoview. For further information please visit [email or web address]. Please follow instruction sheet, as given.    Follow-Up: At [facility], you and your health needs are our priority.  As part of our continuing mission to provide you with exceptional heart care, we have created designated Provider Care Teams.  These Care Teams include your primary Cardiologist (physician) and Advanced Practice Providers (APPs -  Physician Assistants and Nurse Practitioners) who all work together to provide you with the care you need, when you need it.  We recommend signing up for the patient portal called "MyChart".  Sign up information is provided on this After Visit Summary.  MyChart is used to connect with patients for Virtual Visits (Telemedicine).  Patients are able to view lab/test results, encounter notes, upcoming appointments, etc.  Non-urgent messages can be sent to your provider as well.   To learn more about what you can do with MyChart, go to [email or web address].    Your next appointment:   4 week(s)  The format for your next appointment:   In Person  Provider:   Berniece Salines, DO   Other [place] [facility] at [facility] [address], [place], [place] [address] Phone:  [phone]    Please arrive 15 minutes prior to your appointment time for registration and insurance purposes.  The test will take approximately 3 to 4 hours to complete; you may bring reading material.  If someone comes with you to your appointment, they will need to remain in the main lobby due to limited space in the testing area. **If you are pregnant or breastfeeding, please notify the nuclear lab prior to your appointment**  How to prepare for your Myocardial Perfusion Test: Do not eat or drink 3 hours prior to your test, except you may have water. Do not consume products containing caffeine (regular or decaffeinated) 12 hours prior to your test. (ex: coffee, chocolate, sodas, tea). Do bring a list of your current medications with you.  If not listed below, you may take your medications as normal. Do wear comfortable clothes (no dresses or overalls) and walking shoes, tennis shoes preferred (No heels or open toe shoes are allowed). Do NOT wear cologne, perfume, aftershave, or lotions (deodorant is allowed). If these instructions are not followed, your test will have to be rescheduled.  Please report to [address] for your test.  If you have questions or concerns about your appointment, you can call the Nuclear Lab at [phone].  If you cannot keep your appointment, please provide 24 hours notification to the Nuclear Lab, to avoid a possible $50 charge to your account.

## 2020-09-17 NOTE — Progress Notes (Signed)
Cardiology Office Note:    Date:  09/17/2020   ID:  Russell Hernandez, DOB 05-Nov-1980, MRN [id number]  PCP:  [name], NP  Cardiologist:  Berniece Salines, DO  Electrophysiologist:  None   Referring MD: [name], NP   " I am having chest pain"  History of Present Illness:    Russell Hernandez is a 40 y.o. male with a hx of hypertension, chronic kidney disease stage IIIa, tobacco use and history of TIA presents today for follow-up visit.  I saw the patient on  October 23, 2019 at that time his blood pressure was appropriate.  He did tell me he was watching his salt intake we therefore continued his amlodipine 10 mg daily carvedilol 25 mg twice daily, clonidine 0.25 mg twice daily and hydralazine 75 mg 3 times a day.    I saw the patient in January 17, 2019 work at that time I increase his clonidine to 0.3 mg twice a day and he remained on his carvedilol as well as his amlodipine.  I asked the patient to follow-up in 3 months.  Unfortunately he had not made any of his appointments up until now.  In the interim I did receive a message from the patient PCP requesting that I control his blood pressure, but I did let her know that the patient does not present for his appointments which will be very difficult to control his blood pressure when he is not going up.  Today he is in office he tells me that not only has his blood pressure being high he has had some intermittent chest discomfort.  He described it as a chest tightness which is left-sided usually on exertion he is concerned about this.  He has not had any shortness of breath.  When talking to the patient he tells me that he is doubly on all of his medications and once he does that for he says he takes his amlodipine twice a day he takes his carvedilol 3 times a day as well continue to take his clonidine twice a day.  He does that and runs out of his medications sooner than he supposed to and able to get refills therefore  he ended up in the emergency department.  Past Medical History:  Diagnosis Date   AKI (acute kidney injury) ([place]) 05/20/2019   Depressed left ventricular ejection fraction 06/13/2019   Hypertension    Hypertensive urgency 05/19/2019   Hypokalemia 05/20/2019   Stage 3a chronic kidney disease ([place]) 06/13/2019   TIA (transient ischemic attack) 05/21/2019    Past Surgical History:  Procedure Laterality Date   No past surgery      Current Medications: Current Meds  Medication Sig   amLODipine (NORVASC) 10 MG tablet Take 1 tablet (10 mg total) by mouth daily.   aspirin EC 81 MG EC tablet Take 1 tablet (81 mg total) by mouth daily.   atorvastatin (LIPITOR) 20 MG tablet Take 1 tablet (20 mg total) by mouth daily at 6 PM.   carvedilol (COREG) 25 MG tablet Take 1 tablet (25 mg total) by mouth 2 (two) times daily with a meal.   cloNIDine (CATAPRES) 0.3 MG tablet Take 1 tablet (0.3 mg total) by mouth 2 (two) times daily. Please schedule office visit for further refills   fluticasone (FLONASE) 50 MCG/ACT nasal spray Place 2 sprays into both nostrils daily for 7 days. (Patient taking differently: Place 2 sprays into both nostrils daily as needed for allergies.)  isosorbide mononitrate (IMDUR) 30 MG 24 hr tablet Take 1 tablet (30 mg total) by mouth daily.   nitroGLYCERIN (NITROSTAT) 0.4 MG SL tablet Place 1 tablet (0.4 mg total) under the tongue every 5 (five) minutes as needed for chest pain.     Allergies:   Lisinopril and Onion   Social History   Socioeconomic History   Marital status: Legally Separated    Spouse name: Not on file   Number of children: Not on file   Years of education: Not on file   Highest education level: Not on file  Occupational History   Not on file  Tobacco Use   Smoking status: Every Day    Packs/day: 0.50    Pack years: 0.00    Types: Cigarettes    Last attempt to quit: 06/06/2019    Years since quitting: 1.2   Smokeless tobacco: Never  Vaping Use   Vaping Use:  Never used  Substance and Sexual Activity   Alcohol use: Yes    Comment: occ   Drug use: Yes    Types: Marijuana   Sexual activity: Not on file  Other Topics Concern   Not on file  Social History Narrative   Not on file   Social Determinants of Health   Financial Resource Strain: Not on file  Food Insecurity: Not on file  Transportation Needs: Not on file  Physical Activity: Not on file  Stress: Not on file  Social Connections: Not on file     Family History: The patient's family history includes Heart disease in his maternal grandmother; Hyperlipidemia in his mother; Hypertension in his maternal grandmother and mother.  ROS:   Review of Systems  Constitution: Negative for decreased appetite, fever and weight gain.  HENT: Negative for congestion, ear discharge, hoarse voice and sore throat.   Eyes: Negative for discharge, redness, vision loss in right eye and visual halos.  Cardiovascular: Negative for chest pain, dyspnea on exertion, leg swelling, orthopnea and palpitations.  Respiratory: Negative for cough, hemoptysis, shortness of breath and snoring.   Endocrine: Negative for heat intolerance and polyphagia.  Hematologic/Lymphatic: Negative for bleeding problem. Does not bruise/bleed easily.  Skin: Negative for flushing, nail changes, rash and suspicious lesions.  Musculoskeletal: Negative for arthritis, joint pain, muscle cramps, myalgias, neck pain and stiffness.  Gastrointestinal: Negative for abdominal pain, bowel incontinence, diarrhea and excessive appetite.  Genitourinary: Negative for decreased libido, genital sores and incomplete emptying.  Neurological: Negative for brief paralysis, focal weakness, headaches and loss of balance.  Psychiatric/Behavioral: Negative for altered mental status, depression and suicidal ideas.  Allergic/Immunologic: Negative for HIV exposure and persistent infections.    EKGs/Labs/Other Studies Reviewed:    The following studies were  reviewed today:   EKG:  The ekg ordered today demonstrates sinus rhythm, heart rate 64 bpm.  Recent Labs: 10/02/2019: BUN 19; Creatinine, Ser 1.90; Hemoglobin 14.4; Platelets 399; Potassium 3.2; Sodium 135  Recent Lipid Panel    Component Value Date/Time   CHOL 298 (H) 05/20/2019 0627   TRIG 173 (H) 05/20/2019 0627   HDL 31 (L) 05/20/2019 0627   CHOLHDL 9.6 05/20/2019 0627   VLDL 35 05/20/2019 0627   LDLCALC 232 (H) 05/20/2019 0627    Physical Exam:    VS:  BP 132/84 (BP Location: Left Arm, Patient Position: Sitting, Cuff Size: Normal)   Pulse 64   Ht 6\' 1"  (1.854 m)   Wt 216 lb (98 kg)   SpO2 98%   BMI 28.50 kg/m  Wt Readings from Last 3 Encounters:  09/17/20 216 lb (98 kg)  01/17/20 206 lb (93.4 kg)  12/31/19 207 lb 12.8 oz (94.3 kg)     GEN: Well nourished, well developed in no acute distress HEENT: Normal NECK: No JVD; No carotid bruits LYMPHATICS: No lymphadenopathy CARDIAC: S1S2 noted,RRR, no murmurs, rubs, gallops RESPIRATORY:  Clear to auscultation without rales, wheezing or rhonchi  ABDOMEN: Soft, non-tender, non-distended, +bowel sounds, no guarding. EXTREMITIES: No edema, No cyanosis, no clubbing MUSCULOSKELETAL:  No deformity  SKIN: Warm and dry NEUROLOGIC:  Alert and oriented x 3, non-focal PSYCHIATRIC:  Normal affect, good insight  ASSESSMENT:    1. Hypertension, unspecified type   2. Chest pain, unspecified type   3. Stage 3a chronic kidney disease ([place])   4. Mixed hyperlipidemia   5. Tobacco use    PLAN:     1.  In terms of his chest pain he has risk factors I like to proceed with an exercise nuclear stress test in this patient to make sure that coronary artery disease is not playing a role here.  In the meantime to I am going to add Imdur 30 mg daily to his regimen.  2.  His blood pressure acceptable in the office today.  I have asked the patient to take his blood pressure daily take his medication as prescribed-he was sending blood  pressure information in 1 week if I need to add additional medication we will plan to do so.  His follow-up visit will be 4 to 6 weeks.  3.  Avoid nephrotoxins  4.  He tells me he quit smoking about 4 months ago.   The patient is in agreement with the above plan. The patient left the office in stable condition.  The patient will follow up in 4 to 6 weeks.   Medication Adjustments/Labs and Tests Ordered: Current medicines are reviewed at length with the patient today.  Concerns regarding medicines are outlined above.  Orders Placed This Encounter  Procedures   MYOCARDIAL PERFUSION IMAGING   EKG 12-Lead   Meds ordered this encounter  Medications   isosorbide mononitrate (IMDUR) 30 MG 24 hr tablet    Sig: Take 1 tablet (30 mg total) by mouth daily.    Dispense:  90 tablet    Refill:  3    Patient Instructions  Medication Instructions:  Your physician has recommended you make the following change in your medication:  START: Imdur 30 mg once daily  *If you need a refill on your cardiac medications before your next appointment, please call your pharmacy*   Lab Work: None If you have labs (blood work) drawn today and your tests are completely normal, you will receive your results only by: [facility] (if you have MyChart) OR A paper copy in the mail If you have any lab test that is abnormal or we need to change your treatment, we will call you to review the results.   Testing/Procedures: Your physician has requested that you have en exercise stress myoview. For further information please visit [email or web address]. Please follow instruction sheet, as given.    Follow-Up: At [facility], you and your health needs are our priority.  As part of our continuing mission to provide you with exceptional heart care, we have created designated Provider Care Teams.  These Care Teams include your primary Cardiologist (physician) and Advanced Practice Providers (APPs -  Physician  Assistants and Nurse Practitioners) who all work together to provide you with the care you  need, when you need it.  We recommend signing up for the patient portal called "MyChart".  Sign up information is provided on this After Visit Summary.  MyChart is used to connect with patients for Virtual Visits (Telemedicine).  Patients are able to view lab/test results, encounter notes, upcoming appointments, etc.  Non-urgent messages can be sent to your provider as well.   To learn more about what you can do with MyChart, go to [email or web address].    Your next appointment:   4 week(s)  The format for your next appointment:   In Person  Provider:   Berniece Salines, DO   Other [facility] at [facility] [address] [place], [place] [address] Phone: [phone]    Please arrive 15 minutes prior to your appointment time for registration and insurance purposes.  The test will take approximately 3 to 4 hours to complete; you may bring reading material.  If someone comes with you to your appointment, they will need to remain in the main lobby due to limited space in the testing area. **If you are pregnant or breastfeeding, please notify the nuclear lab prior to your appointment**  How to prepare for your Myocardial Perfusion Test: Do not eat or drink 3 hours prior to your test, except you may have water. Do not consume products containing caffeine (regular or decaffeinated) 12 hours prior to your test. (ex: coffee, chocolate, sodas, tea). Do bring a list of your current medications with you.  If not listed below, you may take your medications as normal. Do wear comfortable clothes (no dresses or overalls) and walking shoes, tennis shoes preferred (No heels or open toe shoes are allowed). Do NOT wear cologne, perfume, aftershave, or lotions (deodorant is allowed). If these instructions are not followed, your test will have to be  rescheduled.  Please report to [address] for your test.  If you have questions or concerns about your appointment, you can call the Nuclear Lab at [phone].  If you cannot keep your appointment, please provide 24 hours notification to the Nuclear Lab, to avoid a possible $50 charge to your account.    Adopting a Healthy Lifestyle.  Know what a healthy weight is for you (roughly BMI <25) and aim to maintain this   Aim for 7+ servings of fruits and vegetables daily   65-80+ fluid ounces of water or unsweet tea for healthy kidneys   Limit to max 1 drink of alcohol per day; avoid smoking/tobacco   Limit animal fats in diet for cholesterol and heart health - choose grass fed whenever available   Avoid highly processed foods, and foods high in saturated/trans fats   Aim for low stress - take time to unwind and care for your mental health   Aim for 150 min of moderate intensity exercise weekly for heart health, and weights twice weekly for bone health   Aim for 7-9 hours of sleep daily   When it comes to diets, agreement about the perfect plan isnt easy to find, even among the experts. Experts at the [employer] developed an idea known as the Healthy Eating Plate. Just imagine a plate divided into logical, healthy portions.   The emphasis is on diet quality:   Load up on vegetables and fruits - one-half of your plate: Aim for color and variety, and remember that potatoes dont count.   Go for whole grains - one-quarter of  your plate: Whole wheat, barley, wheat berries, quinoa, oats, brown rice, and foods made with them. If you want pasta, go with whole wheat pasta.   Protein power - one-quarter of your plate: Fish, chicken, beans, and nuts are all healthy, versatile protein sources. Limit red meat.   The diet, however, does go beyond the plate, offering a few other suggestions.   Use healthy plant oils, such as olive, canola, soy, corn,  sunflower and peanut. Check the labels, and avoid partially hydrogenated oil, which have unhealthy trans fats.   If youre thirsty, drink water. Coffee and tea are good in moderation, but skip sugary drinks and limit milk and dairy products to one or two daily servings.   The type of carbohydrate in the diet is more important than the amount. Some sources of carbohydrates, such as vegetables, fruits, whole grains, and beans-are healthier than others.   Finally, stay active  Signed, Berniece Salines, DO  09/17/2020 9:24 AM    [facility]

## 2020-10-12 ENCOUNTER — Telehealth: Payer: Self-pay | Admitting: Cardiology

## 2020-10-12 ENCOUNTER — Other Ambulatory Visit (INDEPENDENT_AMBULATORY_CARE_PROVIDER_SITE_OTHER): Payer: Self-pay | Admitting: Primary Care

## 2020-10-12 DIAGNOSIS — I10 Essential (primary) hypertension: Secondary | ICD-10-CM

## 2020-10-12 MED ORDER — CLONIDINE HCL 0.3 MG PO TABS: 0.3000 mg | ORAL_TABLET | Freq: Two times a day (BID) | ORAL | 3 refills | Status: DC

## 2020-10-12 MED ORDER — CARVEDILOL 25 MG PO TABS: 25.0000 mg | ORAL_TABLET | Freq: Two times a day (BID) | ORAL | 3 refills | Status: DC

## 2020-10-12 MED ORDER — ATORVASTATIN CALCIUM 20 MG PO TABS: 20.0000 mg | ORAL_TABLET | Freq: Every day | ORAL | 3 refills | Status: DC

## 2020-10-12 MED ORDER — AMLODIPINE BESYLATE 10 MG PO TABS: 10.0000 mg | ORAL_TABLET | Freq: Every day | ORAL | 3 refills | Status: DC

## 2020-10-12 NOTE — Telephone Encounter (Signed)
*  STAT* If patient is at the pharmacy, call can be transferred to refill team.   1. Which medications need to be refilled? (please list name of each medication and dose if known) cloNIDine (CATAPRES) 0.3 MG tablet amLODipine (NORVASC) 10 MG tablet carvedilol (COREG) 25 MG tablet atorvastatin (LIPITOR) 20 MG tablet 2. Which pharmacy/location (including street and city if local pharmacy) is medication to be sent to? [facility] [address]  3. Do they need a 30 day or 90 day supply? 90 day supply

## 2020-10-12 NOTE — Telephone Encounter (Signed)
Pt called, is upset that his refills are not ready  amLODipine (NORVASC) 10 MG tablet carvedilol (COREG) 25 MG tablet [   [place], [place] [address]  [place] [address]  Phone: [phone] Fax: [phone]

## 2020-10-12 NOTE — Telephone Encounter (Signed)
Refilled today by cardiology, see encounter.

## 2020-10-12 NOTE — Telephone Encounter (Signed)
Refill sent in per request.  

## 2020-10-19 ENCOUNTER — Telehealth (HOSPITAL_COMMUNITY): Payer: Self-pay | Admitting: *Deleted

## 2020-10-19 NOTE — Telephone Encounter (Signed)
Patient given detailed instructions per Myocardial Perfusion Study Information Sheet for the test on 10/26/20 at 10:00. Patient notified to arrive 15 minutes early and that it is imperative to arrive on time for appointment to keep from having the test rescheduled.  If you need to cancel or reschedule your appointment, please call the office within 24 hours of your appointment. . Patient verbalized understanding.Russell Hernandez

## 2020-10-26 ENCOUNTER — Ambulatory Visit (HOSPITAL_COMMUNITY): Payer: Self-pay | Attending: Cardiovascular Disease

## 2020-10-26 ENCOUNTER — Encounter (HOSPITAL_COMMUNITY): Payer: Self-pay | Admitting: Cardiology

## 2020-11-03 ENCOUNTER — Telehealth (HOSPITAL_COMMUNITY): Payer: Self-pay | Admitting: Cardiology

## 2020-11-03 NOTE — Telephone Encounter (Signed)
Just an FYI. We have made several attempts to contact this patient including sending a letter to schedule or reschedule their Myoview. We will be removing the patient from the Max.  10/26/20 NO SHOWED-MAILED LETTER [facility]     Thank you

## 2020-11-17 ENCOUNTER — Ambulatory Visit: Payer: Self-pay | Admitting: Cardiology

## 2020-11-20 ENCOUNTER — Ambulatory Visit: Payer: Self-pay | Admitting: Cardiology

## 2020-12-29 ENCOUNTER — Emergency Department (HOSPITAL_BASED_OUTPATIENT_CLINIC_OR_DEPARTMENT_OTHER): Payer: Self-pay

## 2020-12-29 ENCOUNTER — Other Ambulatory Visit: Payer: Self-pay

## 2020-12-29 ENCOUNTER — Emergency Department (HOSPITAL_BASED_OUTPATIENT_CLINIC_OR_DEPARTMENT_OTHER)
Admission: EM | Admit: 2020-12-29 | Discharge: 2020-12-29 | Disposition: A | Discharge status: Home / Self Care | Payer: Self-pay | Attending: Emergency Medicine | Admitting: Emergency Medicine

## 2020-12-29 ENCOUNTER — Encounter (HOSPITAL_BASED_OUTPATIENT_CLINIC_OR_DEPARTMENT_OTHER): Payer: Self-pay | Admitting: *Deleted

## 2020-12-29 DIAGNOSIS — Z8673 Personal history of transient ischemic attack (TIA), and cerebral infarction without residual deficits: Secondary | ICD-10-CM | POA: Insufficient documentation

## 2020-12-29 DIAGNOSIS — Z79899 Other long term (current) drug therapy: Secondary | ICD-10-CM | POA: Insufficient documentation

## 2020-12-29 DIAGNOSIS — N1831 Chronic kidney disease, stage 3a: Secondary | ICD-10-CM | POA: Insufficient documentation

## 2020-12-29 DIAGNOSIS — F17210 Nicotine dependence, cigarettes, uncomplicated: Secondary | ICD-10-CM | POA: Insufficient documentation

## 2020-12-29 DIAGNOSIS — I129 Hypertensive chronic kidney disease with stage 1 through stage 4 chronic kidney disease, or unspecified chronic kidney disease: Secondary | ICD-10-CM | POA: Insufficient documentation

## 2020-12-29 DIAGNOSIS — R1084 Generalized abdominal pain: Secondary | ICD-10-CM | POA: Insufficient documentation

## 2020-12-29 DIAGNOSIS — K5900 Constipation, unspecified: Secondary | ICD-10-CM | POA: Insufficient documentation

## 2020-12-29 DIAGNOSIS — R079 Chest pain, unspecified: Secondary | ICD-10-CM

## 2020-12-29 DIAGNOSIS — R0789 Other chest pain: Secondary | ICD-10-CM | POA: Insufficient documentation

## 2020-12-29 DIAGNOSIS — Z7982 Long term (current) use of aspirin: Secondary | ICD-10-CM | POA: Insufficient documentation

## 2020-12-29 LAB — CBC WITH DIFFERENTIAL/PLATELET
Abs Immature Granulocytes: 0.01 10*3/uL (ref 0.00–0.07)
Basophils Absolute: 0.1 10*3/uL (ref 0.0–0.1)
Basophils Relative: 1 %
Eosinophils Absolute: 0.7 10*3/uL — ABNORMAL HIGH (ref 0.0–0.5)
Eosinophils Relative: 7 %
HCT: 43.5 % (ref 39.0–52.0)
Hemoglobin: 14.8 g/dL (ref 13.0–17.0)
Immature Granulocytes: 0 %
Lymphocytes Relative: 36 %
Lymphs Abs: 3.5 10*3/uL (ref 0.7–4.0)
MCH: 29.7 pg (ref 26.0–34.0)
MCHC: 34 g/dL (ref 30.0–36.0)
MCV: 87.3 fL (ref 80.0–100.0)
Monocytes Absolute: 0.8 10*3/uL (ref 0.1–1.0)
Monocytes Relative: 8 %
Neutro Abs: 4.8 10*3/uL (ref 1.7–7.7)
Neutrophils Relative %: 48 %
Platelets: 384 10*3/uL (ref 150–400)
RBC: 4.98 MIL/uL (ref 4.22–5.81)
RDW: 14.9 % (ref 11.5–15.5)
WBC: 9.8 10*3/uL (ref 4.0–10.5)
nRBC: 0 % (ref 0.0–0.2)

## 2020-12-29 LAB — LIPASE, BLOOD: Lipase: 29 U/L (ref 11–51)

## 2020-12-29 LAB — COMPREHENSIVE METABOLIC PANEL
ALT: 23 U/L (ref 0–44)
AST: 17 U/L (ref 15–41)
Albumin: 4.3 g/dL (ref 3.5–5.0)
Alkaline Phosphatase: 51 U/L (ref 38–126)
Anion gap: 9 (ref 5–15)
BUN: 20 mg/dL (ref 6–20)
CO2: 26 mmol/L (ref 22–32)
Calcium: 9.7 mg/dL (ref 8.9–10.3)
Chloride: 102 mmol/L (ref 98–111)
Creatinine, Ser: 1.9 mg/dL — ABNORMAL HIGH (ref 0.61–1.24)
GFR, Estimated: 45 mL/min — ABNORMAL LOW (ref >60)
Glucose, Bld: 103 mg/dL — ABNORMAL HIGH (ref 70–99)
Potassium: 3.7 mmol/L (ref 3.5–5.1)
Sodium: 137 mmol/L (ref 135–145)
Total Bilirubin: 0.2 mg/dL — ABNORMAL LOW (ref 0.3–1.2)
Total Protein: 7.9 g/dL (ref 6.5–8.1)

## 2020-12-29 LAB — URINALYSIS, ROUTINE W REFLEX MICROSCOPIC
Bilirubin Urine: NEGATIVE
Glucose, UA: NEGATIVE mg/dL
Hgb urine dipstick: NEGATIVE
Ketones, ur: 5 mg/dL — AB
Leukocytes,Ua: NEGATIVE
Nitrite: NEGATIVE
Protein, ur: NEGATIVE mg/dL
Specific Gravity, Urine: 1.02 (ref 1.005–1.030)
pH: 5.5 (ref 5.0–8.0)

## 2020-12-29 LAB — TROPONIN I (HIGH SENSITIVITY): Troponin I (High Sensitivity): 7 ng/L (ref <18)

## 2020-12-29 NOTE — ED Provider Notes (Signed)
[facility] EMERGENCY DEPARTMENT Provider Note   CSN: [id number] Arrival date & time: 12/29/20  1812     History Chief Complaint  Patient presents with   Abdominal Pain    Russell Hernandez is a 40 y.o. male.  40 year old male with past medical history of AKI, hypertension, chronic kidney disease, TIA with additional history as listed below presents with multiple complaints.  Patient states that he is having pain on his left and right sides of abdomen with occasional constipation which has been ongoing for several months.  There is nothing new or different about this pain today.  He does have epigastric discomfort that has been ongoing for an unknown amount of time as well.  And states that for the past week or so when he smokes he experiences tightness in his chest.  This tightness in the chest is what prompted him to come to the emergency room today.  He is not having pain at this time, denies shortness of breath, cough, wheezing, history of asthma or chronic lung disease.  Denies nausea, diaphoresis.  Denies significant family cardiac history.  No other complaints or concerns today.      Past Medical History:  Diagnosis Date   AKI (acute kidney injury) ([place]) 05/20/2019   Depressed left ventricular ejection fraction 06/13/2019   Hypertension    Hypertensive urgency 05/19/2019   Hypokalemia 05/20/2019   Stage 3a chronic kidney disease ([place]) 06/13/2019   TIA (transient ischemic attack) 05/21/2019    Patient Active Problem List   Diagnosis Date Noted   Chest pain 09/17/2020   Tobacco use 01/17/2020   Mixed hyperlipidemia 10/23/2019   Stage 3a chronic kidney disease ([place]) 06/13/2019   Depressed left ventricular ejection fraction 06/13/2019   Hypertension    Hypokalemia 05/20/2019   AKI (acute kidney injury) ([place]) 05/20/2019   TIA (transient ischemic attack)    Hypertensive urgency 05/19/2019    Past Surgical History:  Procedure Laterality Date   No past surgery          Family History  Problem Relation Age of Onset   Hypertension Mother    Hyperlipidemia Mother    Hypertension Maternal Grandmother    Heart disease Maternal Grandmother     Social History   Tobacco Use   Smoking status: Every Day    Packs/day: 0.50    Types: Cigarettes    Last attempt to quit: 06/06/2019    Years since quitting: 1.5   Smokeless tobacco: Never  Vaping Use   Vaping Use: Never used  Substance Use Topics   Alcohol use: Yes    Comment: occ   Drug use: Yes    Types: Marijuana    Home Medications Prior to Admission medications   Medication Sig Start Date End Date Taking? Authorizing Provider  amLODipine (NORVASC) 10 MG tablet Take 1 tablet (10 mg total) by mouth daily. 10/12/20 04/10/21  Tobb, Godfrey Pick, DO  aspirin EC 81 MG EC tablet Take 1 tablet (81 mg total) by mouth daily. 05/25/19   Mercy Riding, MD  atorvastatin (LIPITOR) 20 MG tablet Take 1 tablet (20 mg total) by mouth daily at 6 PM. 10/12/20   Tobb, Kardie, DO  azithromycin (ZITHROMAX) 250 MG tablet Take 2 tablet day 1 than take 1 tablet until gone Patient not taking: Reported on 09/17/2020 12/31/19   [name], NP  carvedilol (COREG) 25 MG tablet Take 1 tablet (25 mg total) by mouth 2 (two) times daily with a meal. 10/12/20  Tobb, Kardie, DO  cloNIDine (CATAPRES) 0.3 MG tablet Take 1 tablet (0.3 mg total) by mouth 2 (two) times daily. Please schedule office visit for further refills 10/12/20   Tobb, Kardie, DO  dextromethorphan-guaiFENesin (MUCINEX DM) 30-600 MG 12hr tablet Take 1 tablet by mouth 2 (two) times daily. Patient not taking: Reported on 09/17/2020 12/31/19   [name], NP  fexofenadine-pseudoephedrine (ALLEGRA-D 24) 180-240 MG 24 hr tablet Take 1 tablet by mouth daily. Patient not taking: Reported on 09/17/2020 11/29/19   [name], NP  fluticasone [facility]) 50 MCG/ACT nasal spray Place 2 sprays into both nostrils daily for 7 days. Patient taking differently: Place 2  sprays into both nostrils daily as needed for allergies. 12/31/19 09/17/20  [name], NP  Iron, Ferrous Sulfate, 325 (65 Fe) MG TABS Take 325 mg by mouth daily. Patient not taking: Reported on 09/17/2020 12/24/19   [name], MD  isosorbide mononitrate (IMDUR) 30 MG 24 hr tablet Take 1 tablet (30 mg total) by mouth daily. 09/17/20 12/16/20  Tobb, Kardie, DO  nitroGLYCERIN (NITROSTAT) 0.4 MG SL tablet Place 1 tablet (0.4 mg total) under the tongue every 5 (five) minutes as needed for chest pain. 06/11/19   [name], NP  potassium chloride SA (KLOR-CON) 20 MEQ tablet Take 1 tablet (20 mEq total) by mouth daily. Patient not taking: Reported on 09/17/2020 09/07/20   [name], NP  hydrochlorothiazide (HYDRODIURIL) 25 MG tablet Take 1 tablet (25 mg total) by mouth daily. 12/10/16 02/05/19  [name], MD  lisinopril (PRINIVIL,ZESTRIL) 10 MG tablet Take 1 tablet (10 mg total) by mouth daily. 12/10/16 01/17/20  [name], MD  omeprazole (PRILOSEC) 20 MG capsule Take 1 capsule (20 mg total) by mouth daily. 12/11/16 02/05/19  Doristine Devoid, PA-C  ranitidine (ZANTAC) 150 MG tablet Take 1 tablet (150 mg total) by mouth 2 (two) times daily. 12/13/16 02/05/19  [name], [name], MD    Allergies    Lisinopril and Onion  Review of Systems   Review of Systems  Constitutional:  Negative for chills, diaphoresis and fever.  HENT:  Negative for congestion.   Respiratory:  Positive for chest tightness and shortness of breath. Negative for cough and wheezing.   Cardiovascular:  Positive for chest pain.  Gastrointestinal:  Positive for abdominal pain and constipation. Negative for diarrhea, nausea and vomiting.  Genitourinary:  Negative for difficulty urinating.  Musculoskeletal:  Negative for arthralgias and myalgias.  Skin:  Negative for rash and wound.  Allergic/Immunologic: Negative for immunocompromised state.  Neurological:  Negative for weakness.  Hematological:   Negative for adenopathy.  Psychiatric/Behavioral:  Negative for confusion.   All other systems reviewed and are negative.  Physical Exam Updated Vital Signs BP (!) 163/106   Pulse (!) 55   Temp 98.3 F (36.8 C) (Oral)   Resp 17   Ht 6\' 1"  (1.854 m)   Wt 98 kg   SpO2 94%   BMI 28.50 kg/m   Physical Exam Vitals and nursing note reviewed.  Constitutional:      General: He is not in acute distress.    Appearance: He is well-developed. He is not diaphoretic.  HENT:     Head: Normocephalic and atraumatic.  Cardiovascular:     Rate and Rhythm: Normal rate and regular rhythm.     Heart sounds: Normal heart sounds.  Pulmonary:     Effort: Pulmonary effort is normal.     Breath sounds: Normal breath sounds.  Chest:  Chest wall: No tenderness.  Abdominal:     Palpations: Abdomen is soft.     Tenderness: There is no abdominal tenderness.  Skin:    General: Skin is warm and dry.     Findings: No erythema or rash.  Neurological:     Mental Status: He is alert and oriented to person, place, and time.  Psychiatric:        Behavior: Behavior normal.    ED Results / Procedures / Treatments   Labs (all labs ordered are listed, but only abnormal results are displayed) Labs Reviewed  CBC WITH DIFFERENTIAL/PLATELET - Abnormal; Notable for the following components:      Result Value   Eosinophils Absolute 0.7 (*)    All other components within normal limits  COMPREHENSIVE METABOLIC PANEL - Abnormal; Notable for the following components:   Glucose, Bld 103 (*)    Creatinine, Ser 1.90 (*)    Total Bilirubin 0.2 (*)    GFR, Estimated 45 (*)    All other components within normal limits  URINALYSIS, ROUTINE W REFLEX MICROSCOPIC - Abnormal; Notable for the following components:   Ketones, ur 5 (*)    All other components within normal limits  LIPASE, BLOOD  TROPONIN I (HIGH SENSITIVITY)    EKG EKG Interpretation  Date/Time:  Tuesday December 29 2020 18:46:39 EDT Ventricular  Rate:  57 PR Interval:  176 QRS Duration: 104 QT Interval:  432 QTC Calculation: 421 R Axis:   -12 Text Interpretation: Sinus rhythm Borderline ST elevation, anterior leads When compared to prior, similar appearance with ST abnormalities to ECG in 2018. No STEMI Confirmed by Antony Blackbird [phone]) on 12/29/2020 6:52:44 PM  Radiology DG Chest Port 1 View  Result Date: 12/29/2020 CLINICAL DATA:  Chest pain. EXAM: PORTABLE CHEST 1 VIEW COMPARISON:  Chest x-ray 10/02/2019. FINDINGS: The heart size and mediastinal contours are within normal limits. Both lungs are clear. The visualized skeletal structures are unremarkable. IMPRESSION: No active disease. Electronically Signed   By: Ronney Asters M.D.   On: 12/29/2020 19:19    Procedures Procedures   Medications Ordered in ED Medications - No data to display  ED Course  I have reviewed the triage vital signs and the nursing notes.  Pertinent labs & imaging results that were available during my care of the patient were reviewed by me and considered in my medical decision making (see chart for details).  Clinical Course as of 12/29/20 2157  Tue Dec 30, 6783  1064 40 year old male with complaint as above.  On exam, abdomen is soft and nontender.  Lungs clear to auscultation, no chest wall tenderness. Encourage patient to discontinue tobacco use as his chest pain occurs with smoking. EKG without significant change from prior, no ischemic changes today.  Urinalysis with small ketones.  Lipase normal.  Initial troponin is 7, not significantly changed from prior on file, has been having chest pain for the past week, not having pain at this time.  CMP reviewed, not significantly changed from prior, creatinine today is 1.9.  CBC within normal limits.  Chest x-ray is unremarkable.  Discussed results with patient, encouraged follow-up with primary care, compliance with blood pressure medication.  Due to risk factors, may benefit from referral to  cardiology.  Due to ongoing abdominal pain and constipation, may need referral to GI however advised to follow-up with PCP for these discussions.  Return to ER for new or worsening symptoms. [LM]    Clinical Course User Index [LM] Suella Broad  A, PA-C   MDM Rules/Calculators/A&P                           Final Clinical Impression(s) / ED Diagnoses Final diagnoses:  Generalized abdominal pain  Chest pain, unspecified type    Rx / DC Orders ED Discharge Orders     None        Roque Lias 12/29/20 2157    Tegeler, Gwenyth Allegra, MD 12/30/20 [phone]

## 2020-12-29 NOTE — ED Triage Notes (Signed)
Abdominal pain on and off for several weeks. Sharp pain goes into his epigastric region. EKG at triage.

## 2020-12-29 NOTE — Discharge Instructions (Addendum)
Recommend recheck with your primary care provider.  Take medication for your blood pressure as prescribed. Your work-up today is overall reassuring, you may benefit from referral to cardiology and/or GI, discuss with your primary care provider at follow-up.  Return to the emergency room for worsening or concerning symptoms.

## 2021-01-05 ENCOUNTER — Ambulatory Visit: Payer: Self-pay | Admitting: Cardiology

## 2021-01-06 NOTE — Progress Notes (Deleted)
Cardiology Office Note:    Date:  01/06/2021   ID:  Russell Hernandez, DOB [birth year], MRN [id number]  PCP:  Richardo Priest, MD  Cardiologist:  Shirlee More, MD    Referring MD: [name], NP    ASSESSMENT:    No diagnosis found. PLAN:    In order of problems listed above:  ***   Next appointment: ***   Medication Adjustments/Labs and Tests Ordered: Current medicines are reviewed at length with the patient today.  Concerns regarding medicines are outlined above.  No orders of the defined types were placed in this encounter.  No orders of the defined types were placed in this encounter.   No chief complaint on file.   History of Present Illness:    Russell Hernandez is a 40 y.o. male with a hx of hypertension with stage IIIa CKD and tobacco use last seen 09/17/2020 for chest pain scheduled for myocardial perfusion study which has not been performed.. Compliance with diet, lifestyle and medications: *** Past Medical History:  Diagnosis Date   AKI (acute kidney injury) ([place]) 05/20/2019   Depressed left ventricular ejection fraction 06/13/2019   Hypertension    Hypertensive urgency 05/19/2019   Hypokalemia 05/20/2019   Stage 3a chronic kidney disease ([place]) 06/13/2019   TIA (transient ischemic attack) 05/21/2019    Past Surgical History:  Procedure Laterality Date   No past surgery      Current Medications: No outpatient medications have been marked as taking for the 01/07/21 encounter (Appointment) with Richardo Priest, MD.     Allergies:   Lisinopril and Onion   Social History   Socioeconomic History   Marital status: Legally Separated    Spouse name: Not on file   Number of children: Not on file   Years of education: Not on file   Highest education level: Not on file  Occupational History   Not on file  Tobacco Use   Smoking status: Every Day    Packs/day: 0.50    Types: Cigarettes    Last attempt to quit: 06/06/2019    Years since  quitting: 1.5   Smokeless tobacco: Never  Vaping Use   Vaping Use: Never used  Substance and Sexual Activity   Alcohol use: Yes    Comment: occ   Drug use: Yes    Types: Marijuana   Sexual activity: Not on file  Other Topics Concern   Not on file  Social History Narrative   Not on file   Social Determinants of Health   Financial Resource Strain: Not on file  Food Insecurity: Not on file  Transportation Needs: Not on file  Physical Activity: Not on file  Stress: Not on file  Social Connections: Not on file     Family History: The patient's ***family history includes Heart disease in his maternal grandmother; Hyperlipidemia in his mother; Hypertension in his maternal grandmother and mother. ROS:   Please see the history of present illness.    All other systems reviewed and are negative.  EKGs/Labs/Other Studies Reviewed:    The following studies were reviewed today:  EKG:  EKG ordered today and personally reviewed.  The ekg ordered today demonstrates ***  Recent Labs: 12/29/2020: ALT 23; BUN 20; Creatinine, Ser 1.90; Hemoglobin 14.8; Platelets 384; Potassium 3.7; Sodium 137  Recent Lipid Panel    Component Value Date/Time   CHOL 298 (H) 05/20/2019 0627   TRIG 173 (H) 05/20/2019 0627   HDL 31 (L) 05/20/2019 3500  CHOLHDL 9.6 05/20/2019 0627   VLDL 35 05/20/2019 0627   LDLCALC 232 (H) 05/20/2019 4834    Physical Exam:    VS:  There were no vitals taken for this visit.    Wt Readings from Last 3 Encounters:  12/29/20 216 lb 0.8 oz (98 kg)  09/17/20 216 lb (98 kg)  01/17/20 206 lb (93.4 kg)     GEN: *** Well nourished, well developed in no acute distress HEENT: Normal NECK: No JVD; No carotid bruits LYMPHATICS: No lymphadenopathy CARDIAC: ***RRR, no murmurs, rubs, gallops RESPIRATORY:  Clear to auscultation without rales, wheezing or rhonchi  ABDOMEN: Soft, non-tender, non-distended MUSCULOSKELETAL:  No edema; No deformity  SKIN: Warm and  dry NEUROLOGIC:  Alert and oriented x 3 PSYCHIATRIC:  Normal affect    Signed, Shirlee More, MD  01/06/2021 7:08 PM    [place]

## 2021-01-07 ENCOUNTER — Ambulatory Visit: Payer: Self-pay | Admitting: Cardiology

## 2021-01-25 ENCOUNTER — Other Ambulatory Visit: Payer: Self-pay

## 2021-01-25 ENCOUNTER — Emergency Department (HOSPITAL_BASED_OUTPATIENT_CLINIC_OR_DEPARTMENT_OTHER): Payer: Self-pay

## 2021-01-25 ENCOUNTER — Emergency Department (HOSPITAL_BASED_OUTPATIENT_CLINIC_OR_DEPARTMENT_OTHER)
Admission: EM | Admit: 2021-01-25 | Discharge: 2021-01-25 | Disposition: A | Discharge status: Home / Self Care | Payer: Self-pay | Attending: Emergency Medicine | Admitting: Emergency Medicine

## 2021-01-25 ENCOUNTER — Encounter: Payer: Self-pay | Admitting: Gastroenterology

## 2021-01-25 ENCOUNTER — Encounter (HOSPITAL_BASED_OUTPATIENT_CLINIC_OR_DEPARTMENT_OTHER): Payer: Self-pay | Admitting: Urology

## 2021-01-25 DIAGNOSIS — F17210 Nicotine dependence, cigarettes, uncomplicated: Secondary | ICD-10-CM | POA: Insufficient documentation

## 2021-01-25 DIAGNOSIS — Z7982 Long term (current) use of aspirin: Secondary | ICD-10-CM | POA: Insufficient documentation

## 2021-01-25 DIAGNOSIS — I129 Hypertensive chronic kidney disease with stage 1 through stage 4 chronic kidney disease, or unspecified chronic kidney disease: Secondary | ICD-10-CM | POA: Insufficient documentation

## 2021-01-25 DIAGNOSIS — N1831 Chronic kidney disease, stage 3a: Secondary | ICD-10-CM | POA: Insufficient documentation

## 2021-01-25 DIAGNOSIS — R079 Chest pain, unspecified: Secondary | ICD-10-CM | POA: Insufficient documentation

## 2021-01-25 DIAGNOSIS — Z79899 Other long term (current) drug therapy: Secondary | ICD-10-CM | POA: Insufficient documentation

## 2021-01-25 DIAGNOSIS — R109 Unspecified abdominal pain: Secondary | ICD-10-CM | POA: Insufficient documentation

## 2021-01-25 LAB — COMPREHENSIVE METABOLIC PANEL
ALT: 18 U/L (ref 0–44)
AST: 14 U/L — ABNORMAL LOW (ref 15–41)
Albumin: 4.1 g/dL (ref 3.5–5.0)
Alkaline Phosphatase: 46 U/L (ref 38–126)
Anion gap: 7 (ref 5–15)
BUN: 19 mg/dL (ref 6–20)
CO2: 28 mmol/L (ref 22–32)
Calcium: 9.8 mg/dL (ref 8.9–10.3)
Chloride: 104 mmol/L (ref 98–111)
Creatinine, Ser: 1.93 mg/dL — ABNORMAL HIGH (ref 0.61–1.24)
GFR, Estimated: 44 mL/min — ABNORMAL LOW (ref >60)
Glucose, Bld: 99 mg/dL (ref 70–99)
Potassium: 3.7 mmol/L (ref 3.5–5.1)
Sodium: 139 mmol/L (ref 135–145)
Total Bilirubin: 0.6 mg/dL (ref 0.3–1.2)
Total Protein: 7.5 g/dL (ref 6.5–8.1)

## 2021-01-25 LAB — CBC
HCT: 43 % (ref 39.0–52.0)
Hemoglobin: 14.3 g/dL (ref 13.0–17.0)
MCH: 29.5 pg (ref 26.0–34.0)
MCHC: 33.3 g/dL (ref 30.0–36.0)
MCV: 88.8 fL (ref 80.0–100.0)
Platelets: 341 10*3/uL (ref 150–400)
RBC: 4.84 MIL/uL (ref 4.22–5.81)
RDW: 14.6 % (ref 11.5–15.5)
WBC: 7.6 10*3/uL (ref 4.0–10.5)
nRBC: 0 % (ref 0.0–0.2)

## 2021-01-25 LAB — TROPONIN I (HIGH SENSITIVITY): Troponin I (High Sensitivity): 7 ng/L (ref <18)

## 2021-01-25 LAB — LIPASE, BLOOD: Lipase: 27 U/L (ref 11–51)

## 2021-01-25 MED ORDER — LIDOCAINE VISCOUS HCL 2 % MT SOLN
15.0000 mL | Freq: Once | OROMUCOSAL | Status: AC
  Administered 2021-01-25: 15 mL via ORAL
  Filled 2021-01-25: qty 15

## 2021-01-25 MED ORDER — ALUM & MAG HYDROXIDE-SIMETH 200-200-20 MG/5ML PO SUSP
30.0000 mL | Freq: Once | ORAL | Status: AC
  Administered 2021-01-25: 30 mL via ORAL
  Filled 2021-01-25: qty 30

## 2021-01-25 NOTE — ED Provider Notes (Signed)
[facility] [facility] Provider Note   CSN: [id number] Arrival date & time: 01/25/21  1331     History Chief Complaint  Patient presents with   Chest Pain    Russell Hernandez is a 40 y.o. male.  past medical history of hypertension, stage III chronic kidney disease, hyperlipidemia, chronic tobacco use, chronic chest pain.  Patient presents today with complaints of chest pain.  States that he has been having chest pain for a while that has come came and went, however over the past 2 days it has worsened.  Patient states that his pain has been intermittent for over the past 2 days.  It is in the center of his chest and radiates up to his neck.  States he feels like he has an aching or shocking feeling every 10 to 15 minutes.  Episodes last about 20 minutes at a time.  When these episodes happen he has a weird sensation in his upper epigastric area.  States that the symptoms normally appear after he smokes a cigarette, however recently it is happened more often without him smoking.  He denies any shortness of breath, palpitations, nausea, vomiting, diarrhea, constipation, numbness or weakness in extremities.  Seen in the emergency department about 2 to 3 weeks ago for similar complaints and had a normal work-up at that time.   Chest Pain Associated symptoms: abdominal pain   Associated symptoms: no back pain, no cough, no dizziness, no fever, no headache, no nausea, no numbness, no palpitations, no shortness of breath, no vomiting and no weakness       Past Medical History:  Diagnosis Date   AKI (acute kidney injury) ([place]) 05/20/2019   Depressed left ventricular ejection fraction 06/13/2019   Hypertension    Hypertensive urgency 05/19/2019   Hypokalemia 05/20/2019   Stage 3a chronic kidney disease ([place]) 06/13/2019   TIA (transient ischemic attack) 05/21/2019    Patient Active Problem List   Diagnosis Date Noted   Chest pain 09/17/2020   Tobacco use 01/17/2020   Mixed  hyperlipidemia 10/23/2019   Stage 3a chronic kidney disease ([place]) 06/13/2019   Depressed left ventricular ejection fraction 06/13/2019   Hypertension    Hypokalemia 05/20/2019   AKI (acute kidney injury) ([place]) 05/20/2019   TIA (transient ischemic attack)    Hypertensive urgency 05/19/2019    Past Surgical History:  Procedure Laterality Date   No past surgery         Family History  Problem Relation Age of Onset   Hypertension Mother    Hyperlipidemia Mother    Hypertension Maternal Grandmother    Heart disease Maternal Grandmother     Social History   Tobacco Use   Smoking status: Every Day    Packs/day: 0.50    Types: Cigarettes    Last attempt to quit: 06/06/2019    Years since quitting: 1.6   Smokeless tobacco: Never  Vaping Use   Vaping Use: Never used  Substance Use Topics   Alcohol use: Yes    Comment: occ   Drug use: Yes    Types: Marijuana    Home Medications Prior to Admission medications   Medication Sig Start Date End Date Taking? Authorizing Provider  amLODipine (NORVASC) 10 MG tablet Take 1 tablet (10 mg total) by mouth daily. 10/12/20 04/10/21  Tobb, Godfrey Pick, DO  aspirin EC 81 MG EC tablet Take 1 tablet (81 mg total) by mouth daily. 05/25/19   Mercy Riding, MD  atorvastatin (LIPITOR) 20 MG  tablet Take 1 tablet (20 mg total) by mouth daily at 6 PM. 10/12/20   Tobb, Kardie, DO  azithromycin (ZITHROMAX) 250 MG tablet Take 2 tablet day 1 than take 1 tablet until gone Patient not taking: Reported on 09/17/2020 12/31/19   [name], NP  carvedilol (COREG) 25 MG tablet Take 1 tablet (25 mg total) by mouth 2 (two) times daily with a meal. 10/12/20   Tobb, Kardie, DO  cloNIDine (CATAPRES) 0.3 MG tablet Take 1 tablet (0.3 mg total) by mouth 2 (two) times daily. Please schedule office visit for further refills 10/12/20   Tobb, Kardie, DO  dextromethorphan-guaiFENesin (MUCINEX DM) 30-600 MG 12hr tablet Take 1 tablet by mouth 2 (two) times daily. Patient not  taking: Reported on 09/17/2020 12/31/19   [name], NP  fexofenadine-pseudoephedrine (ALLEGRA-D 24) 180-240 MG 24 hr tablet Take 1 tablet by mouth daily. Patient not taking: Reported on 09/17/2020 11/29/19   [name], NP  fluticasone [facility]) 50 MCG/ACT nasal spray Place 2 sprays into both nostrils daily for 7 days. Patient taking differently: Place 2 sprays into both nostrils daily as needed for allergies. 12/31/19 09/17/20  [name], NP  Iron, Ferrous Sulfate, 325 (65 Fe) MG TABS Take 325 mg by mouth daily. Patient not taking: Reported on 09/17/2020 12/24/19   [name], MD  isosorbide mononitrate (IMDUR) 30 MG 24 hr tablet Take 1 tablet (30 mg total) by mouth daily. 09/17/20 12/16/20  Tobb, Kardie, DO  nitroGLYCERIN (NITROSTAT) 0.4 MG SL tablet Place 1 tablet (0.4 mg total) under the tongue every 5 (five) minutes as needed for chest pain. 06/11/19   [name], NP  potassium chloride SA (KLOR-CON) 20 MEQ tablet Take 1 tablet (20 mEq total) by mouth daily. Patient not taking: Reported on 09/17/2020 09/07/20   [name], NP  hydrochlorothiazide (HYDRODIURIL) 25 MG tablet Take 1 tablet (25 mg total) by mouth daily. 12/10/16 02/05/19  [name], MD  lisinopril (PRINIVIL,ZESTRIL) 10 MG tablet Take 1 tablet (10 mg total) by mouth daily. 12/10/16 01/17/20  [name], MD  omeprazole (PRILOSEC) 20 MG capsule Take 1 capsule (20 mg total) by mouth daily. 12/11/16 02/05/19  Doristine Devoid, PA-C  ranitidine (ZANTAC) 150 MG tablet Take 1 tablet (150 mg total) by mouth 2 (two) times daily. 12/13/16 02/05/19  [name], [name], MD    Allergies    Lisinopril and Onion  Review of Systems   Review of Systems  Constitutional:  Negative for chills and fever.  HENT:  Negative for congestion, rhinorrhea and sore throat.   Eyes:  Negative for visual disturbance.  Respiratory:  Negative for cough, chest tightness and shortness of breath.   Cardiovascular:   Positive for chest pain. Negative for palpitations and leg swelling.  Gastrointestinal:  Positive for abdominal pain. Negative for blood in stool, constipation, diarrhea, nausea and vomiting.  Genitourinary:  Negative for dysuria, flank pain and hematuria.  Musculoskeletal:  Negative for back pain.  Skin:  Negative for rash and wound.  Neurological:  Negative for dizziness, syncope, weakness, light-headedness, numbness and headaches.  Psychiatric/Behavioral:  Negative for confusion.   All other systems reviewed and are negative.  Physical Exam Updated Vital Signs BP (!) 173/110   Pulse (!) 54   Temp 97.9 F (36.6 C) (Oral)   Resp 12   Ht 6\' 1"  (1.854 m)   Wt 98 kg   SpO2 98%   BMI 28.50 kg/m   Physical Exam Vitals and nursing  note reviewed.  Constitutional:      General: He is not in acute distress.    Appearance: Normal appearance. He is not ill-appearing, toxic-appearing or diaphoretic.  HENT:     Head: Normocephalic and atraumatic.     Nose: No nasal deformity.     Mouth/Throat:     Lips: Pink. No lesions.     Mouth: Mucous membranes are moist. No injury, lacerations, oral lesions or angioedema.     Pharynx: Oropharynx is clear. Uvula midline. No pharyngeal swelling, oropharyngeal exudate, posterior oropharyngeal erythema or uvula swelling.  Eyes:     General: Gaze aligned appropriately. No scleral icterus.       Right eye: No discharge.        Left eye: No discharge.     Conjunctiva/sclera: Conjunctivae normal.     Right eye: Right conjunctiva is not injected. No exudate or hemorrhage.    Left eye: Left conjunctiva is not injected. No exudate or hemorrhage. Cardiovascular:     Rate and Rhythm: Normal rate and regular rhythm.     Pulses: Normal pulses.          Radial pulses are 2+ on the right side and 2+ on the left side.       Dorsalis pedis pulses are 2+ on the right side and 2+ on the left side.     Heart sounds: Normal heart sounds, S1 normal and S2 normal.  Heart sounds not distant. No murmur heard.   No friction rub. No gallop. No S3 or S4 sounds.  Pulmonary:     Effort: Pulmonary effort is normal. No accessory muscle usage or respiratory distress.     Breath sounds: Normal breath sounds. No stridor. No wheezing, rhonchi or rales.  Chest:     Chest wall: No tenderness.  Abdominal:     General: Abdomen is flat. Bowel sounds are normal. There is no distension.     Palpations: Abdomen is soft. There is no mass or pulsatile mass.     Tenderness: There is no abdominal tenderness. There is no guarding or rebound.  Musculoskeletal:     Right lower leg: No edema.     Left lower leg: No edema.  Skin:    General: Skin is warm and dry.     Coloration: Skin is not jaundiced or pale.     Findings: No bruising, erythema, lesion or rash.  Neurological:     General: No focal deficit present.     Mental Status: He is alert and oriented to person, place, and time.     GCS: GCS eye subscore is 4. GCS verbal subscore is 5. GCS motor subscore is 6.  Psychiatric:        Mood and Affect: Mood normal.        Behavior: Behavior normal. Behavior is cooperative.     Comments: Anxious    ED Results / Procedures / Treatments   Labs (all labs ordered are listed, but only abnormal results are displayed) Labs Reviewed  COMPREHENSIVE METABOLIC PANEL - Abnormal; Notable for the following components:      Result Value   Creatinine, Ser 1.93 (*)    AST 14 (*)    GFR, Estimated 44 (*)    All other components within normal limits  CBC  LIPASE, BLOOD  TROPONIN I (HIGH SENSITIVITY)  TROPONIN I (HIGH SENSITIVITY)    EKG EKG Interpretation  Date/Time:  Monday January 25 2021 14:32:27 EST Ventricular Rate:  57 PR Interval:  167  QRS Duration: 106 QT Interval:  444 QTC Calculation: 433 R Axis:   3 Text Interpretation: Sinus rhythm Nonspecific repol abnormality, inferior leads Borderline ST elevation, lateral leads Confirmed by Thamas Jaegers ([id number]) on  01/25/2021 2:41:29 PM  Radiology DG Chest 2 View  Result Date: 01/25/2021 CLINICAL DATA:  Intermittent chest pain over the last few days. EXAM: CHEST - 2 VIEW COMPARISON:  12/29/2020 FINDINGS: The heart size and mediastinal contours are within normal limits. Both lungs are clear. The visualized skeletal structures are unremarkable. IMPRESSION: No active cardiopulmonary disease. Electronically Signed   By: Nelson Chimes M.D.   On: 01/25/2021 14:31    Procedures Procedures   Medications Ordered in ED Medications  alum & mag hydroxide-simeth (MAALOX/MYLANTA) 200-200-20 MG/5ML suspension 30 mL (30 mLs Oral Given 01/25/21 1439)    And  lidocaine (XYLOCAINE) 2 % viscous mouth solution 15 mL (15 mLs Oral Given 01/25/21 1439)    ED Course  I have reviewed the triage vital signs and the nursing notes.  Pertinent labs & imaging results that were available during my care of the patient were reviewed by me and considered in my medical decision making (see chart for details).    MDM Rules/Calculators/A&P                          Patient presents with intermittent chest pain over the past several months that is worsened over the past 2 days.  He also describes a discomfort in his upper epigastric region and some strength sensations in his neck when this happens.  On arrival, he is afebrile and vitals are stable.  Exam was unremarkable.  Reviewed previous charts and it looks like he was seen a couple weeks ago for similar complaints.  At time he had a negative work-up.  Possible GI referral was considered if symptoms do not improve.  I have personally reviewed all labs and imaging.  -EKG was initially concerning for ST elevations and reciprocal changes, however Dr. Almyra Free evaluated this and compared to multiple previous EKGs over the past several years, with similar findings on EKG. -CBC assuring -CMP with elevation of creatinine to 1.93, which is baseline for patient.  Otherwise reassuring. -Lipase  normal -Negative troponin  I provided patient with GI cocktail in the emergency department.  His symptoms have resolved following discharge.  I reassured patient that his work-up was negative.  Do not suspect ACS, PE, aortic dissection, pneumothorax, or pneumonia.  I suspect that this presentation is GI in etiology.  Recommend following up with cardiologist for visit near future.  Recommend gastroenterology referral for chronic symptoms.  Final Clinical Impression(s) / ED Diagnoses Final diagnoses:  Nonspecific chest pain    Rx / DC Orders ED Discharge Orders     None        Sheila Oats 01/25/21 1919    Luna Fuse, MD 02/01/21 1606

## 2021-01-25 NOTE — Discharge Instructions (Addendum)
Please schedule an appt with the gastroenterology group listed above to be evaluated for GERD and esophageal issues that may be causing your chest pains  Please follow up with your cardiology office regarding being seen in the ED. You can express any concerns regarding your EKGs with them.  Please return to the ED if you develop worsening symptoms.

## 2021-01-25 NOTE — ED Triage Notes (Signed)
Pt states Chest pain intermittent for two days, feels "shocking" feeling every 10-15 min.  Denies SOB.

## 2021-01-25 NOTE — ED Notes (Signed)
D/c paperwork reviewed with pt, including f/u care. Pt with no questions or concerns at time of d/c. Ambulatory on RA to ED exit with family.

## 2021-01-25 NOTE — ED Notes (Signed)
Pt states pain in left side of chest is exacerbated when he smokes.

## 2021-01-27 ENCOUNTER — Emergency Department (HOSPITAL_BASED_OUTPATIENT_CLINIC_OR_DEPARTMENT_OTHER)
Admission: EM | Admit: 2021-01-27 | Discharge: 2021-01-27 | Disposition: A | Discharge status: Home / Self Care | Payer: Self-pay | Attending: Emergency Medicine | Admitting: Emergency Medicine

## 2021-01-27 ENCOUNTER — Encounter (HOSPITAL_BASED_OUTPATIENT_CLINIC_OR_DEPARTMENT_OTHER): Payer: Self-pay

## 2021-01-27 ENCOUNTER — Other Ambulatory Visit: Payer: Self-pay

## 2021-01-27 ENCOUNTER — Emergency Department (HOSPITAL_BASED_OUTPATIENT_CLINIC_OR_DEPARTMENT_OTHER): Payer: Self-pay

## 2021-01-27 DIAGNOSIS — Z7951 Long term (current) use of inhaled steroids: Secondary | ICD-10-CM | POA: Insufficient documentation

## 2021-01-27 DIAGNOSIS — N1831 Chronic kidney disease, stage 3a: Secondary | ICD-10-CM | POA: Insufficient documentation

## 2021-01-27 DIAGNOSIS — F17210 Nicotine dependence, cigarettes, uncomplicated: Secondary | ICD-10-CM | POA: Insufficient documentation

## 2021-01-27 DIAGNOSIS — Z7982 Long term (current) use of aspirin: Secondary | ICD-10-CM | POA: Insufficient documentation

## 2021-01-27 DIAGNOSIS — Z79899 Other long term (current) drug therapy: Secondary | ICD-10-CM | POA: Insufficient documentation

## 2021-01-27 DIAGNOSIS — R0789 Other chest pain: Secondary | ICD-10-CM | POA: Insufficient documentation

## 2021-01-27 DIAGNOSIS — I129 Hypertensive chronic kidney disease with stage 1 through stage 4 chronic kidney disease, or unspecified chronic kidney disease: Secondary | ICD-10-CM | POA: Insufficient documentation

## 2021-01-27 DIAGNOSIS — R079 Chest pain, unspecified: Secondary | ICD-10-CM

## 2021-01-27 LAB — BASIC METABOLIC PANEL
Anion gap: 8 (ref 5–15)
BUN: 20 mg/dL (ref 6–20)
CO2: 24 mmol/L (ref 22–32)
Calcium: 9 mg/dL (ref 8.9–10.3)
Chloride: 102 mmol/L (ref 98–111)
Creatinine, Ser: 1.93 mg/dL — ABNORMAL HIGH (ref 0.61–1.24)
GFR, Estimated: 44 mL/min — ABNORMAL LOW (ref >60)
Glucose, Bld: 113 mg/dL — ABNORMAL HIGH (ref 70–99)
Potassium: 3.8 mmol/L (ref 3.5–5.1)
Sodium: 134 mmol/L — ABNORMAL LOW (ref 135–145)

## 2021-01-27 LAB — CBC
HCT: 45 % (ref 39.0–52.0)
Hemoglobin: 15 g/dL (ref 13.0–17.0)
MCH: 29.4 pg (ref 26.0–34.0)
MCHC: 33.3 g/dL (ref 30.0–36.0)
MCV: 88.2 fL (ref 80.0–100.0)
Platelets: 357 10*3/uL (ref 150–400)
RBC: 5.1 MIL/uL (ref 4.22–5.81)
RDW: 14.4 % (ref 11.5–15.5)
WBC: 8 10*3/uL (ref 4.0–10.5)
nRBC: 0 % (ref 0.0–0.2)

## 2021-01-27 LAB — TROPONIN I (HIGH SENSITIVITY)
Troponin I (High Sensitivity): 7 ng/L (ref <18)
Troponin I (High Sensitivity): 6 ng/L (ref <18)

## 2021-01-27 NOTE — ED Provider Notes (Signed)
[place] [facility] Provider Note   CSN: [id number] Arrival date & time: 01/27/21  1835     History Chief Complaint  Patient presents with   Chest Pain    Russell Hernandez is a 40 y.o. male.  Presented to the ER with concern for chest pain.  Has past medical history of CKD, hypertension, hyperlipidemia, tobacco abuse, TIA.  States that earlier today he had an episode of chest discomfort, central, nonradiating, described as moderate.  States that this was not associated with exertion.  Has completely resolved.  Does not have any ongoing pain.  No difficulty in breathing.  Came to ER 2 days ago for similar symptoms.  Per chart review, cardiologist over the summer, had recommended patient obtain stress test but this was not completed.  HPI     Past Medical History:  Diagnosis Date   AKI (acute kidney injury) ([place]) 05/20/2019   Depressed left ventricular ejection fraction 06/13/2019   Hypertension    Hypertensive urgency 05/19/2019   Hypokalemia 05/20/2019   Stage 3a chronic kidney disease ([place]) 06/13/2019   TIA (transient ischemic attack) 05/21/2019    Patient Active Problem List   Diagnosis Date Noted   Chest pain 09/17/2020   Tobacco use 01/17/2020   Mixed hyperlipidemia 10/23/2019   Stage 3a chronic kidney disease ([place]) 06/13/2019   Depressed left ventricular ejection fraction 06/13/2019   Hypertension    Hypokalemia 05/20/2019   AKI (acute kidney injury) ([place]) 05/20/2019   TIA (transient ischemic attack)    Hypertensive urgency 05/19/2019    Past Surgical History:  Procedure Laterality Date   No past surgery         Family History  Problem Relation Age of Onset   Hypertension Mother    Hyperlipidemia Mother    Hypertension Maternal Grandmother    Heart disease Maternal Grandmother     Social History   Tobacco Use   Smoking status: Every Day    Packs/day: 0.50    Types: Cigarettes    Last attempt to quit: 06/06/2019    Years since  quitting: 1.6   Smokeless tobacco: Never  Vaping Use   Vaping Use: Never used  Substance Use Topics   Alcohol use: Yes    Comment: occ   Drug use: Yes    Types: Marijuana    Home Medications Prior to Admission medications   Medication Sig Start Date End Date Taking? Authorizing Provider  amLODipine (NORVASC) 10 MG tablet Take 1 tablet (10 mg total) by mouth daily. 10/12/20 04/10/21  Tobb, Godfrey Pick, DO  aspirin EC 81 MG EC tablet Take 1 tablet (81 mg total) by mouth daily. 05/25/19   Mercy Riding, MD  atorvastatin (LIPITOR) 20 MG tablet Take 1 tablet (20 mg total) by mouth daily at 6 PM. 10/12/20   Tobb, Kardie, DO  azithromycin (ZITHROMAX) 250 MG tablet Take 2 tablet day 1 than take 1 tablet until gone Patient not taking: Reported on 09/17/2020 12/31/19   [name], NP  carvedilol (COREG) 25 MG tablet Take 1 tablet (25 mg total) by mouth 2 (two) times daily with a meal. 10/12/20   Tobb, Kardie, DO  cloNIDine (CATAPRES) 0.3 MG tablet Take 1 tablet (0.3 mg total) by mouth 2 (two) times daily. Please schedule office visit for further refills 10/12/20   Tobb, Kardie, DO  dextromethorphan-guaiFENesin (MUCINEX DM) 30-600 MG 12hr tablet Take 1 tablet by mouth 2 (two) times daily. Patient not taking: Reported on 09/17/2020 12/31/19  Juluis Mire P, NP  fexofenadine-pseudoephedrine (ALLEGRA-D 24) 180-240 MG 24 hr tablet Take 1 tablet by mouth daily. Patient not taking: Reported on 09/17/2020 11/29/19   [name], NP  fluticasone [facility]) 50 MCG/ACT nasal spray Place 2 sprays into both nostrils daily for 7 days. Patient taking differently: Place 2 sprays into both nostrils daily as needed for allergies. 12/31/19 09/17/20  [name], NP  Iron, Ferrous Sulfate, 325 (65 Fe) MG TABS Take 325 mg by mouth daily. Patient not taking: Reported on 09/17/2020 12/24/19   [name], MD  isosorbide mononitrate (IMDUR) 30 MG 24 hr tablet Take 1 tablet (30 mg total) by mouth daily. 09/17/20  12/16/20  Tobb, Kardie, DO  nitroGLYCERIN (NITROSTAT) 0.4 MG SL tablet Place 1 tablet (0.4 mg total) under the tongue every 5 (five) minutes as needed for chest pain. 06/11/19   [name], NP  potassium chloride SA (KLOR-CON) 20 MEQ tablet Take 1 tablet (20 mEq total) by mouth daily. Patient not taking: Reported on 09/17/2020 09/07/20   [name], NP  hydrochlorothiazide (HYDRODIURIL) 25 MG tablet Take 1 tablet (25 mg total) by mouth daily. 12/10/16 02/05/19  [name], MD  lisinopril (PRINIVIL,ZESTRIL) 10 MG tablet Take 1 tablet (10 mg total) by mouth daily. 12/10/16 01/17/20  [name], MD  omeprazole (PRILOSEC) 20 MG capsule Take 1 capsule (20 mg total) by mouth daily. 12/11/16 02/05/19  Doristine Devoid, PA-C  ranitidine (ZANTAC) 150 MG tablet Take 1 tablet (150 mg total) by mouth 2 (two) times daily. 12/13/16 02/05/19  [name], [name], MD    Allergies    Lisinopril and Onion  Review of Systems   Review of Systems  Constitutional:  Negative for chills and fever.  HENT:  Negative for ear pain and sore throat.   Eyes:  Negative for pain and visual disturbance.  Respiratory:  Negative for cough and shortness of breath.   Cardiovascular:  Positive for chest pain. Negative for palpitations.  Gastrointestinal:  Negative for abdominal pain and vomiting.  Genitourinary:  Negative for dysuria and hematuria.  Musculoskeletal:  Negative for arthralgias and back pain.  Skin:  Negative for color change and rash.  Neurological:  Negative for seizures and syncope.  All other systems reviewed and are negative.  Physical Exam Updated Vital Signs BP (!) 152/82   Pulse (!) 57   Temp 97.8 F (36.6 C) (Oral)   Resp 14   Ht 6\' 1"  (1.854 m)   Wt 95.7 kg   SpO2 94%   BMI 27.84 kg/m   Physical Exam Vitals and nursing note reviewed.  Constitutional:      Appearance: He is well-developed.  HENT:     Head: Normocephalic and atraumatic.  Eyes:     Conjunctiva/sclera:  Conjunctivae normal.  Cardiovascular:     Rate and Rhythm: Normal rate and regular rhythm.     Heart sounds: No murmur heard. Pulmonary:     Effort: Pulmonary effort is normal. No respiratory distress.     Breath sounds: Normal breath sounds.  Abdominal:     Palpations: Abdomen is soft.     Tenderness: There is no abdominal tenderness.  Musculoskeletal:     Cervical back: Neck supple.     Right lower leg: No edema.     Left lower leg: No edema.  Skin:    General: Skin is warm and dry.  Neurological:     Mental Status: He is alert.    ED Results / Procedures /  Treatments   Labs (all labs ordered are listed, but only abnormal results are displayed) Labs Reviewed  BASIC METABOLIC PANEL - Abnormal; Notable for the following components:      Result Value   Sodium 134 (*)    Glucose, Bld 113 (*)    Creatinine, Ser 1.93 (*)    GFR, Estimated 44 (*)    All other components within normal limits  CBC  TROPONIN I (HIGH SENSITIVITY)  TROPONIN I (HIGH SENSITIVITY)    EKG EKG Interpretation  Date/Time:  Wednesday January 27 2021 18:49:19 EST Ventricular Rate:  62 PR Interval:  176 QRS Duration: 100 QT Interval:  420 QTC Calculation: 426 R Axis:   -19 Text Interpretation: Normal sinus rhythm Minimal voltage criteria for LVH, may be normal variant ( Cornell product ) Septal infarct , age undetermined Abnormal ECG no significant change when compared to prior on 01/25/21 Confirmed by Madalyn Rob [phone]) on 01/27/2021 8:49:29 PM  Radiology DG Chest 2 View  Result Date: 01/27/2021 CLINICAL DATA:  Chest pain. EXAM: CHEST - 2 VIEW COMPARISON:  01/25/2021 FINDINGS: Cardiac silhouette is normal in size and configuration. Normal mediastinal and hilar contours. Clear lungs.  No pleural effusion or pneumothorax. Skeletal structures are unremarkable. IMPRESSION: No active cardiopulmonary disease. Electronically Signed   By: Lajean Manes M.D.   On: 01/27/2021 18:59     Procedures Procedures   Medications Ordered in ED Medications - No data to display  ED Course  I have reviewed the triage vital signs and the nursing notes.  Pertinent labs & imaging results that were available during my care of the patient were reviewed by me and considered in my medical decision making (see chart for details).    MDM Rules/Calculators/A&P                           40 year old gentleman presents to ER with concern for chest pain episode.  On exam he appears well in no distress.  He has no ongoing pain or symptoms at present.  His EKG appears unchanged from prior.  Troponin x2 was within normal limits, doubt ACS.  CXR negative.  Given this work-up, patient's lack of ongoing symptoms, believe he can be discharged and managed in the outpatient setting.  Recommend that he follow-up closely with his cardiologist and his primary care doctor.  Reports that he does have appointment next week with cardiology.  Encouraged to keep this appointment, reviewed return precautions and discharged.  After the discussed management above, the patient was determined to be safe for discharge.  The patient was in agreement with this plan and all questions regarding their care were answered.  ED return precautions were discussed and the patient will return to the ED with any significant worsening of condition.   Final Clinical Impression(s) / ED Diagnoses Final diagnoses:  Chest pain, unspecified type    Rx / DC Orders ED Discharge Orders     None        Lucrezia Starch, MD 01/27/21 2239

## 2021-01-27 NOTE — ED Notes (Signed)
Pt care taken, pt feels dizzy, not himself. A&O x 4, worse than he felt on Monday when he was here

## 2021-01-27 NOTE — Discharge Instructions (Signed)
Please follow-up with your cardiologist and your primary care doctor.  Discussed this episode of pain with your cardiologist, additionally discussed potential need for stress test.  If you develop recurrent chest pain, difficulty in breathing or other new concerning symptom, come back to ER for reassessment.

## 2021-01-27 NOTE — ED Triage Notes (Signed)
Pt c/o CP x "couple years"-states was seen here 2 days ago for the same-denies fever/flu sx-NAD-steady gait

## 2021-01-29 ENCOUNTER — Emergency Department (HOSPITAL_BASED_OUTPATIENT_CLINIC_OR_DEPARTMENT_OTHER)
Admission: EM | Admit: 2021-01-29 | Discharge: 2021-01-30 | Disposition: A | Discharge status: Home / Self Care | Payer: Self-pay | Attending: Emergency Medicine | Admitting: Emergency Medicine

## 2021-01-29 ENCOUNTER — Encounter (HOSPITAL_BASED_OUTPATIENT_CLINIC_OR_DEPARTMENT_OTHER): Payer: Self-pay | Admitting: *Deleted

## 2021-01-29 ENCOUNTER — Other Ambulatory Visit: Payer: Self-pay

## 2021-01-29 DIAGNOSIS — I129 Hypertensive chronic kidney disease with stage 1 through stage 4 chronic kidney disease, or unspecified chronic kidney disease: Secondary | ICD-10-CM | POA: Insufficient documentation

## 2021-01-29 DIAGNOSIS — Z79899 Other long term (current) drug therapy: Secondary | ICD-10-CM | POA: Insufficient documentation

## 2021-01-29 DIAGNOSIS — F17210 Nicotine dependence, cigarettes, uncomplicated: Secondary | ICD-10-CM | POA: Insufficient documentation

## 2021-01-29 DIAGNOSIS — Z7982 Long term (current) use of aspirin: Secondary | ICD-10-CM | POA: Insufficient documentation

## 2021-01-29 DIAGNOSIS — N1831 Chronic kidney disease, stage 3a: Secondary | ICD-10-CM | POA: Insufficient documentation

## 2021-01-29 DIAGNOSIS — R1013 Epigastric pain: Secondary | ICD-10-CM | POA: Insufficient documentation

## 2021-01-29 DIAGNOSIS — R072 Precordial pain: Secondary | ICD-10-CM

## 2021-01-29 LAB — COMPREHENSIVE METABOLIC PANEL
ALT: 16 U/L (ref 0–44)
AST: 13 U/L — ABNORMAL LOW (ref 15–41)
Albumin: 3.9 g/dL (ref 3.5–5.0)
Alkaline Phosphatase: 43 U/L (ref 38–126)
Anion gap: 8 (ref 5–15)
BUN: 18 mg/dL (ref 6–20)
CO2: 24 mmol/L (ref 22–32)
Calcium: 8.6 mg/dL — ABNORMAL LOW (ref 8.9–10.3)
Chloride: 105 mmol/L (ref 98–111)
Creatinine, Ser: 1.75 mg/dL — ABNORMAL HIGH (ref 0.61–1.24)
GFR, Estimated: 50 mL/min — ABNORMAL LOW (ref >60)
Glucose, Bld: 115 mg/dL — ABNORMAL HIGH (ref 70–99)
Potassium: 3.9 mmol/L (ref 3.5–5.1)
Sodium: 137 mmol/L (ref 135–145)
Total Bilirubin: 0.3 mg/dL (ref 0.3–1.2)
Total Protein: 7.2 g/dL (ref 6.5–8.1)

## 2021-01-29 LAB — CBC WITH DIFFERENTIAL/PLATELET
Abs Immature Granulocytes: 0.02 10*3/uL (ref 0.00–0.07)
Basophils Absolute: 0.1 10*3/uL (ref 0.0–0.1)
Basophils Relative: 1 %
Eosinophils Absolute: 0.5 10*3/uL (ref 0.0–0.5)
Eosinophils Relative: 6 %
HCT: 41.6 % (ref 39.0–52.0)
Hemoglobin: 13.7 g/dL (ref 13.0–17.0)
Immature Granulocytes: 0 %
Lymphocytes Relative: 41 %
Lymphs Abs: 3.4 10*3/uL (ref 0.7–4.0)
MCH: 29.7 pg (ref 26.0–34.0)
MCHC: 32.9 g/dL (ref 30.0–36.0)
MCV: 90.2 fL (ref 80.0–100.0)
Monocytes Absolute: 0.8 10*3/uL (ref 0.1–1.0)
Monocytes Relative: 10 %
Neutro Abs: 3.4 10*3/uL (ref 1.7–7.7)
Neutrophils Relative %: 42 %
Platelets: 323 10*3/uL (ref 150–400)
RBC: 4.61 MIL/uL (ref 4.22–5.81)
RDW: 14.7 % (ref 11.5–15.5)
WBC: 8.2 10*3/uL (ref 4.0–10.5)
nRBC: 0 % (ref 0.0–0.2)

## 2021-01-29 LAB — TROPONIN I (HIGH SENSITIVITY): Troponin I (High Sensitivity): 5 ng/L (ref <18)

## 2021-01-29 NOTE — ED Triage Notes (Signed)
C/o mid sternal cp x 1 week  seen here 11/14/ and 11/16 for same. Pt reports same pain

## 2021-01-29 NOTE — ED Provider Notes (Signed)
[facility] Provider Note   CSN: [id number] Arrival date & time: 01/29/21  2031     History Chief Complaint  Patient presents with   Chest Pain    Russell Hernandez is a 40 y.o. male.  The history is provided by the patient and medical records.  Chest Pain Russell Hernandez is a 40 y.o. male who presents to the Emergency Department complaining of epigastric/lower chest pain.  Sxs started today when he woke up at 8pm.  He describes as a pressure type sensation.  No sob.  Made him feel panicked.  Sxs lasted for about an hour and had been resolved for about an hour at time of ED presentation.    No fever, cough, diaphoresis, N/V/D, leg swelling/pain, no palpitations.  Constipated two days ago - took a laxative and those sxs are now resolved.   Had two prior ED visits this week for similar episodes.    Scheduled to see Cardiology and GI for these symptoms in a week.    Uses tobacco, occasional alcohol.    Works third shift at a [employer].     Past Medical History:  Diagnosis Date   AKI (acute kidney injury) ([place]) 05/20/2019   Depressed left ventricular ejection fraction 06/13/2019   Hypertension    Hypertensive urgency 05/19/2019   Hypokalemia 05/20/2019   Stage 3a chronic kidney disease ([place]) 06/13/2019   TIA (transient ischemic attack) 05/21/2019    Patient Active Problem List   Diagnosis Date Noted   Chest pain 09/17/2020   Tobacco use 01/17/2020   Mixed hyperlipidemia 10/23/2019   Stage 3a chronic kidney disease ([place]) 06/13/2019   Depressed left ventricular ejection fraction 06/13/2019   Hypertension    Hypokalemia 05/20/2019   AKI (acute kidney injury) ([place]) 05/20/2019   TIA (transient ischemic attack)    Hypertensive urgency 05/19/2019    Past Surgical History:  Procedure Laterality Date   No past surgery         Family History  Problem Relation Age of Onset   Hypertension Mother    Hyperlipidemia Mother     Hypertension Maternal Grandmother    Heart disease Maternal Grandmother     Social History   Tobacco Use   Smoking status: Every Day    Packs/day: 0.50    Types: Cigarettes    Last attempt to quit: 06/06/2019    Years since quitting: 1.6   Smokeless tobacco: Never  Vaping Use   Vaping Use: Never used  Substance Use Topics   Alcohol use: Yes    Comment: occ   Drug use: Yes    Types: Marijuana    Home Medications Prior to Admission medications   Medication Sig Start Date End Date Taking? Authorizing Provider  amLODipine (NORVASC) 10 MG tablet Take 1 tablet (10 mg total) by mouth daily. 10/12/20 04/10/21  Tobb, Godfrey Pick, DO  aspirin EC 81 MG EC tablet Take 1 tablet (81 mg total) by mouth daily. 05/25/19   Mercy Riding, MD  atorvastatin (LIPITOR) 20 MG tablet Take 1 tablet (20 mg total) by mouth daily at 6 PM. 10/12/20   Tobb, Kardie, DO  azithromycin (ZITHROMAX) 250 MG tablet Take 2 tablet day 1 than take 1 tablet until gone Patient not taking: Reported on 09/17/2020 12/31/19   [name], NP  carvedilol (COREG) 25 MG tablet Take 1 tablet (25 mg total) by mouth 2 (two) times daily with a meal. 10/12/20   Tobb, Godfrey Pick, DO  cloNIDine (CATAPRES) 0.3 MG tablet Take 1 tablet (0.3 mg total) by mouth 2 (two) times daily. Please schedule office visit for further refills 10/12/20   Tobb, Kardie, DO  dextromethorphan-guaiFENesin (MUCINEX DM) 30-600 MG 12hr tablet Take 1 tablet by mouth 2 (two) times daily. Patient not taking: Reported on 09/17/2020 12/31/19   [name], NP  fexofenadine-pseudoephedrine (ALLEGRA-D 24) 180-240 MG 24 hr tablet Take 1 tablet by mouth daily. Patient not taking: Reported on 09/17/2020 11/29/19   [name], NP  fluticasone [facility]) 50 MCG/ACT nasal spray Place 2 sprays into both nostrils daily for 7 days. Patient taking differently: Place 2 sprays into both nostrils daily as needed for allergies. 12/31/19 09/17/20  [name], NP  Iron, Ferrous  Sulfate, 325 (65 Fe) MG TABS Take 325 mg by mouth daily. Patient not taking: Reported on 09/17/2020 12/24/19   [name], MD  isosorbide mononitrate (IMDUR) 30 MG 24 hr tablet Take 1 tablet (30 mg total) by mouth daily. 09/17/20 12/16/20  Tobb, Kardie, DO  nitroGLYCERIN (NITROSTAT) 0.4 MG SL tablet Place 1 tablet (0.4 mg total) under the tongue every 5 (five) minutes as needed for chest pain. 06/11/19   [name], NP  potassium chloride SA (KLOR-CON) 20 MEQ tablet Take 1 tablet (20 mEq total) by mouth daily. Patient not taking: Reported on 09/17/2020 09/07/20   [name], NP  hydrochlorothiazide (HYDRODIURIL) 25 MG tablet Take 1 tablet (25 mg total) by mouth daily. 12/10/16 02/05/19  [name], MD  lisinopril (PRINIVIL,ZESTRIL) 10 MG tablet Take 1 tablet (10 mg total) by mouth daily. 12/10/16 01/17/20  [name], MD  omeprazole (PRILOSEC) 20 MG capsule Take 1 capsule (20 mg total) by mouth daily. 12/11/16 02/05/19  Doristine Devoid, PA-C  ranitidine (ZANTAC) 150 MG tablet Take 1 tablet (150 mg total) by mouth 2 (two) times daily. 12/13/16 02/05/19  [name], [name], MD    Allergies    Lisinopril and Onion  Review of Systems   Review of Systems  Cardiovascular:  Positive for chest pain.  All other systems reviewed and are negative.  Physical Exam Updated Vital Signs BP 131/78   Pulse (!) 56   Temp 98.2 F (36.8 C) (Oral)   Resp 14   Ht 6\' 1"  (1.854 m)   Wt 95.3 kg   SpO2 99%   BMI 27.71 kg/m   Physical Exam Vitals and nursing note reviewed.  Constitutional:      Appearance: He is well-developed.  HENT:     Head: Normocephalic and atraumatic.  Cardiovascular:     Rate and Rhythm: Normal rate and regular rhythm.     Heart sounds: No murmur heard. Pulmonary:     Effort: Pulmonary effort is normal. No respiratory distress.     Breath sounds: Normal breath sounds.  Abdominal:     Palpations: Abdomen is soft.     Tenderness: There is no abdominal  tenderness. There is no guarding or rebound.  Musculoskeletal:        General: No swelling or tenderness.     Comments: 2+ DP pulses bilaterally  Skin:    General: Skin is warm and dry.  Neurological:     Mental Status: He is alert and oriented to person, place, and time.  Psychiatric:        Behavior: Behavior normal.    ED Results / Procedures / Treatments   Labs (all labs ordered are listed, but only abnormal results are displayed) Labs Reviewed  COMPREHENSIVE METABOLIC PANEL - Abnormal; Notable for the following components:      Result Value   Glucose, Bld 115 (*)    Creatinine, Ser 1.75 (*)    Calcium 8.6 (*)    AST 13 (*)    GFR, Estimated 50 (*)    All other components within normal limits  CBC WITH DIFFERENTIAL/PLATELET  TROPONIN I (HIGH SENSITIVITY)  TROPONIN I (HIGH SENSITIVITY)    EKG EKG Interpretation  Date/Time:  Friday January 29 2021 20:42:51 EST Ventricular Rate:  59 PR Interval:  172 QRS Duration: 98 QT Interval:  436 QTC Calculation: 431 R Axis:   -10 Text Interpretation: Sinus bradycardia Otherwise normal ECG Confirmed by Quintella Reichert [phone]) on 01/29/2021 11:14:20 PM  Radiology No results found.  Procedures Procedures   Medications Ordered in ED Medications - No data to display  ED Course  I have reviewed the triage vital signs and the nursing notes.  Pertinent labs & imaging results that were available during my care of the patient were reviewed by me and considered in my medical decision making (see chart for details).    MDM Rules/Calculators/A&P                          patient with history of CKD, hypertension, hyperlipidemia, tobacco use here for evaluation of episodic chest pain, third ED visit of the week. EKG is without significant changes. Troponins are negative times two. Labs with stable renal insufficiency. No evidence of pneumonia, acute CHF, hypertensive urgency. Pulses are symmetric and patient is not distressed,  presentation is not consistent with dissection, PE. Discussed with patient home care for chest discomfort. Patient does report having associated significant anxiety, feel this may be a contributing factor. Feel patient is safe for discharge home with outpatient cardiology follow-up in the next week. Discussed charting a baby aspirin and return precautions.  Final Clinical Impression(s) / ED Diagnoses Final diagnoses:  Precordial pain    Rx / DC Orders ED Discharge Orders     None        Quintella Reichert, MD 01/30/21 [phone]

## 2021-01-30 LAB — TROPONIN I (HIGH SENSITIVITY): Troponin I (High Sensitivity): 6 ng/L (ref <18)

## 2021-02-02 ENCOUNTER — Other Ambulatory Visit: Payer: Self-pay

## 2021-02-02 ENCOUNTER — Ambulatory Visit (INDEPENDENT_AMBULATORY_CARE_PROVIDER_SITE_OTHER): Payer: Self-pay | Admitting: Primary Care

## 2021-02-02 ENCOUNTER — Encounter (INDEPENDENT_AMBULATORY_CARE_PROVIDER_SITE_OTHER): Payer: Self-pay | Admitting: Primary Care

## 2021-02-02 VITALS — BP 140/84 | HR 57 | Temp 97.9°F | Ht 73.0 in | Wt 214.4 lb

## 2021-02-02 DIAGNOSIS — Z09 Encounter for follow-up examination after completed treatment for conditions other than malignant neoplasm: Secondary | ICD-10-CM

## 2021-02-02 DIAGNOSIS — M940 Chondrocostal junction syndrome [Tietze]: Secondary | ICD-10-CM

## 2021-02-02 DIAGNOSIS — Z720 Tobacco use: Secondary | ICD-10-CM

## 2021-02-02 DIAGNOSIS — I10 Essential (primary) hypertension: Secondary | ICD-10-CM

## 2021-02-02 DIAGNOSIS — F17210 Nicotine dependence, cigarettes, uncomplicated: Secondary | ICD-10-CM

## 2021-02-02 NOTE — Progress Notes (Signed)
[facility]   HPI [name] is a 40 y.o.male presents for several emergency room visits for chest pain November 11, November 14th and January 29, 2021 each visit was similar signs and symptoms Pain in the middle of the chest explaining costochondritis able to replicate the pain muscle skeletal inflammatory process. Pain is in the center of his chest and radiates up to his neck.  States he feels like he has an aching or shocking feeling every 10 to 15 minutes.  Episodes last about 20 minutes at a time.  When these episodes happen he has a weird sensation in his upper epigastric area.  Patient states he was told not to return to the emergency room for chest pain but to follow-up with primary care.  Did have a stern talk with him about recurrent visits and signs and symptoms of a heart attack.  No one can tell you not to return to a facility but will frequency of his visits with chest pain can be overlooked. Denies shortness of breath, headaches, chest pain or lower extremity edema   Past Medical History:  Diagnosis Date   AKI (acute kidney injury) ([place]) 05/20/2019   Depressed left ventricular ejection fraction 06/13/2019   Hypertension    Hypertensive urgency 05/19/2019   Hypokalemia 05/20/2019   Stage 3a chronic kidney disease ([place]) 06/13/2019   TIA (transient ischemic attack) 05/21/2019     Allergies  Allergen Reactions   Lisinopril Anaphylaxis    angioedema   Onion Other (See Comments)    Sinus congestion      Current Outpatient Medications on File Prior to Visit  Medication Sig Dispense Refill   amLODipine (NORVASC) 10 MG tablet Take 1 tablet (10 mg total) by mouth daily. 90 tablet 3   aspirin EC 81 MG EC tablet Take 1 tablet (81 mg total) by mouth daily. 90 tablet 1   atorvastatin (LIPITOR) 20 MG tablet Take 1 tablet (20 mg total) by mouth daily at 6 PM. 90 tablet 3   azithromycin  (ZITHROMAX) 250 MG tablet Take 2 tablet day 1 than take 1 tablet until gone 6 tablet 0   carvedilol (COREG) 25 MG tablet Take 1 tablet (25 mg total) by mouth 2 (two) times daily with a meal. 180 tablet 3   cloNIDine (CATAPRES) 0.3 MG tablet Take 1 tablet (0.3 mg total) by mouth 2 (two) times daily. Please schedule office visit for further refills 180 tablet 3   dextromethorphan-guaiFENesin (MUCINEX DM) 30-600 MG 12hr tablet Take 1 tablet by mouth 2 (two) times daily. 60 tablet 1   fexofenadine-pseudoephedrine (ALLEGRA-D 24) 180-240 MG 24 hr tablet Take 1 tablet by mouth daily. 30 tablet 1   Iron, Ferrous Sulfate, 325 (65 Fe) MG TABS Take 325 mg by mouth daily. 30 tablet 6   nitroGLYCERIN (NITROSTAT) 0.4 MG SL tablet Place 1 tablet (0.4 mg total) under the tongue every 5 (five) minutes as needed for chest pain. 50 tablet 3   potassium chloride SA (KLOR-CON) 20 MEQ tablet Take 1 tablet (20 mEq total) by  mouth daily. 30 tablet 0   fluticasone (FLONASE) 50 MCG/ACT nasal spray Place 2 sprays into both nostrils daily for 7 days. (Patient taking differently: Place 2 sprays into both nostrils daily as needed for allergies.) 1 g 0   isosorbide mononitrate (IMDUR) 30 MG 24 hr tablet Take 1 tablet (30 mg total) by mouth daily. 90 tablet 3   [DISCONTINUED] hydrochlorothiazide (HYDRODIURIL) 25 MG tablet Take 1 tablet (25 mg total) by mouth daily. 30 tablet 2   [DISCONTINUED] lisinopril (PRINIVIL,ZESTRIL) 10 MG tablet Take 1 tablet (10 mg total) by mouth daily. 30 tablet 0   [DISCONTINUED] omeprazole (PRILOSEC) 20 MG capsule Take 1 capsule (20 mg total) by mouth daily. 30 capsule 0   [DISCONTINUED] ranitidine (ZANTAC) 150 MG tablet Take 1 tablet (150 mg total) by mouth 2 (two) times daily. 60 tablet 0   No current facility-administered medications on file prior to visit.    ROS: all negative except above.   Physical Exam: Filed Weights   02/02/21 1105  Weight: 214 lb 6.4 oz (97.3 kg)   BP 140/84 (BP  Location: Left Arm, Patient Position: Sitting, Cuff Size: Normal)   Pulse (!) 57   Temp 97.9 F (36.6 C) (Temporal)   Ht 6\' 1"  (1.854 m)   Wt 214 lb 6.4 oz (97.3 kg)   SpO2 96%   BMI 28.29 kg/m  General Appearance: Well nourished, in no apparent distress. Eyes: PERRLA, EOMs, conjunctiva no swelling or erythema Sinuses: No Frontal/maxillary tenderness ENT/Mouth: Ext aud canals clear. Hearing normal.  Neck: Supple, thyroid normal.  Respiratory: Respiratory effort normal, BS equal bilaterally without rales, rhonchi, wheezing or stridor.  Cardio: RRR with no MRGs. Brisk peripheral pulses without edema.  Abdomen: Soft, + BS.  Non tender, no guarding, rebound, hernias, masses. Lymphatics: Non tender without lymphadenopathy.  Musculoskeletal: Full ROM, 5/5 strength, normal gait.  Skin: Warm, dry without rashes, lesions, ecchymosis.  Neuro: Cranial nerves intact. Normal muscle tone, no cerebellar symptoms. Sensation intact.  Psych: Awake and oriented X 3, normal affect, Insight and Judgment appropriate.    Russell Hernandez was seen today for follow-up.  Diagnoses and all orders for this visit:  Hospital discharge follow-up. Retrieved from hospital discharge Follow up with Tobb, Godfrey Pick, DO (Cardiology) Follow up with Richardo Priest, MD (Cardiology) Costochondritis Discussed signs and symptoms of a heart attack when to go to the emergency room when to take 325 mg aspirin showing up and call 911 patient verbalized understanding  Tobacco use - I have recommended complete cessation of tobacco use. I have discussed various options available for assistance with tobacco cessation including over the counter methods (Nicotine gum, patch and lozenges). We also discussed prescription options (Chantix, Nicotine Inhaler / Nasal Spray). The patient is not interested in pursuing any prescription tobacco cessation options at this time. - Patient declines at this time.  .   Primary hypertension Counseled  on blood pressure goal of less than 130/80, low-sodium, DASH diet, medication compliance, 150 minutes of moderate intensity exercise per week. Discussed medication compliance, adverse effects.    [name], NP 11:55 AM

## 2021-02-02 NOTE — Patient Instructions (Signed)
Costochondritis Costochondritis is irritation and swelling (inflammation) of the tissue that connects the ribs to the breastbone (sternum). This tissue is called cartilage. Costochondritis causes pain in the front of the chest. Usually, the pain: Starts slowly. Is in more than one rib. What are the causes? The exact cause of this condition is not always known. It results from stress on the tissue in the affected area. The cause of this stress could be: Chest injury. Exercise or activity, such as lifting. Very bad coughing. What increases the risk? You are more likely to develop this condition if you: Are male. Are 67-45 years old. Recently started a new exercise or work activity. Have low levels of vitamin D. Have a condition that makes you cough often. What are the signs or symptoms? The main symptom of this condition is chest pain. The pain: Usually starts slowly and can be sharp or dull. Gets worse with deep breathing, coughing, or exercise. Gets better with rest. May be worse when you press on the affected area of your ribs and breastbone. How is this treated? This condition usually goes away on its own over time. Your doctor may prescribe an NSAID, such as ibuprofen. This can help reduce pain and inflammation. Treatment may also include: Resting and avoiding activities that make pain worse. Putting heat or ice on the painful area. Doing exercises to stretch your chest muscles. If these treatments do not help, your doctor may inject a numbing medicine to help relieve the pain. Follow these instructions at home: Managing pain, stiffness, and swelling   If told, put ice on the painful area. To do this: Put ice in a plastic bag. Place a towel between your skin and the bag. Leave the ice on for 20 minutes, 2-3 times a day. If told, put heat on the affected area. Do this as often as told by your doctor. Use the heat source that your doctor recommends, such as a moist heat pack or  a heating pad. Place a towel between your skin and the heat source. Leave the heat on for 20-30 minutes. Take off the heat if your skin turns bright red. This is very important if you cannot feel pain, heat, or cold. You may have a greater risk of getting burned. Activity Rest as told by your doctor. Do not do anything that makes your pain worse. This includes any activities that use chest, belly (abdomen), and side muscles. Do not lift anything that is heavier than 10 lb (4.5 kg), or the limit that you are told, until your doctor says that it is safe. Return to your normal activities as told by your doctor. Ask your doctor what activities are safe for you. General instructions Take over-the-counter and prescription medicines only as told by your doctor. Keep all follow-up visits as told by your doctor. This is important. Contact a doctor if: You have chills or a fever. Your pain does not go away or it gets worse. You have a cough that does not go away. Get help right away if: You are short of breath. You have very bad chest pain that is not helped by medicines, heat, or ice. These symptoms may be an emergency. Do not wait to see if the symptoms will go away. Get medical help right away. Call your local emergency services (911 in the [place] Do not drive yourself to the hospital. Summary Costochondritis is irritation and swelling (inflammation) of the tissue that connects the ribs to the breastbone (sternum). This condition  causes pain in the front of the chest. Treatment may include medicines, rest, heat or ice, and exercises. This information is not intended to replace advice given to you by your health care provider. Make sure you discuss any questions you have with your health care provider. Document Revised: 01/11/2019 Document Reviewed: 01/11/2019 Elsevier Patient Education  2022 [employer].

## 2021-02-09 ENCOUNTER — Telehealth (INDEPENDENT_AMBULATORY_CARE_PROVIDER_SITE_OTHER): Payer: Self-pay | Admitting: Primary Care

## 2021-02-18 ENCOUNTER — Ambulatory Visit (INDEPENDENT_AMBULATORY_CARE_PROVIDER_SITE_OTHER): Payer: Self-pay | Admitting: Cardiology

## 2021-02-18 ENCOUNTER — Other Ambulatory Visit: Payer: Self-pay

## 2021-02-18 ENCOUNTER — Encounter: Payer: Self-pay | Admitting: Cardiology

## 2021-02-18 VITALS — BP 173/116 | HR 76 | Ht 73.0 in | Wt 215.0 lb

## 2021-02-18 DIAGNOSIS — E876 Hypokalemia: Secondary | ICD-10-CM

## 2021-02-18 DIAGNOSIS — E782 Mixed hyperlipidemia: Secondary | ICD-10-CM

## 2021-02-18 DIAGNOSIS — R079 Chest pain, unspecified: Secondary | ICD-10-CM

## 2021-02-18 DIAGNOSIS — N1831 Chronic kidney disease, stage 3a: Secondary | ICD-10-CM

## 2021-02-18 DIAGNOSIS — I119 Hypertensive heart disease without heart failure: Secondary | ICD-10-CM

## 2021-02-18 MED ORDER — MINOXIDIL 10 MG PO TABS: 5.0000 mg | ORAL_TABLET | Freq: Every day | ORAL | 3 refills | Status: DC

## 2021-02-18 NOTE — Telephone Encounter (Signed)
Patient called in on status of referral. Please call back

## 2021-02-18 NOTE — Progress Notes (Signed)
Cardiology Office Note:    Date:  02/18/2021   ID:  [name], DOB [birth year], MRN [id number]  PCP:  [name], NP  Cardiologist:  Shirlee More, MD    Referring MD: [name], NP    ASSESSMENT:    1. Chest pain, unspecified type   2. Hypertensive heart disease without heart failure   3. Stage 3a chronic kidney disease (HCC)    PLAN:    In order of problems listed above:  Mr. Schneck has longstanding poorly controlled hypertension with LVH and CKD and his recurrent episodes of chest pain.  Ideally best served with CTA as last GFR was 50 cc we will recheck renal function along with a lipid profile and we will plan on doing CTA we can also evaluate his thoracic aorta think he is a very high risk man especially with his severe dyslipidemia.  He is on a statin recheck his lipid profile and likely needs combined therapy Poorly controlled I will start him on low-dose minoxidil, put him on a diuretic but I asked him to let me know if he develops edema.   Next appointment: 4 weeks   Medication Adjustments/Labs and Tests Ordered: Current medicines are reviewed at length with the patient today.  Concerns regarding medicines are outlined above.  No orders of the defined types were placed in this encounter.  No orders of the defined types were placed in this encounter. Chief complaint I continue to have chest pain History of Present Illness:    Russell Hernandez is a 40 y.o. male with a hx of hypertension stage IIIa kidney disease and a background history of TIA last seen 09/17/2020 by Dr. Harriet Masson.  Compliance with diet, lifestyle and medications: Yes  Is told the past he has congestive heart failure is made him very apprehensive and he has had multiple ED visits with chest pain His chest pain is substernal varies right and left chest not with physical activity not relieved with rest.  No associated shortness of breath palpitation. Home blood pressures  continue to be elevated in the past he took hydralazine and he said it was not helpful.  He also has CKD 01/29/2021 creatinine 1.75 potassium 3.6. He is on a statin his last lipid profile March 2021 with an LDL of 232  He had 3 ED visits in November for chest pain. He has had multiple high-sensitivity troponins all normal  Component Ref Range & Units 2 wk ago  (01/29/21) 2 wk ago  (01/29/21) 3 wk ago  (01/27/21) 3 wk ago  (01/27/21) 3 wk ago  (01/25/21) 1 mo ago  (12/29/20) 1 yr ago  (10/02/19)  Troponin I (High Sensitivity) <18 ng/L 6  5 CM  6 CM  7 CM  7 CM  7 CM  9 CM    His EKG 01/27/2021 shows sinus rhythm voltage criteria for LVH and a pattern of old anteroseptal MI. An echocardiogram performed 05/20/2019 showing left ventricular dysfunction EF 45 to 50% global hypokinesia moderate concentric LVH normal right ventricular size and function. Chest x-ray 01/27/2021 was normal. Is last creatinine 1.75 with a GFR 50 cc/min. Past Medical History:  Diagnosis Date   AKI (acute kidney injury) ([place]) 05/20/2019   Depressed left ventricular ejection fraction 06/13/2019   Hypertension    Hypertensive urgency 05/19/2019   Hypokalemia 05/20/2019   Stage 3a chronic kidney disease ([place]) 06/13/2019   TIA (transient ischemic attack) 05/21/2019    Past Surgical History:  Procedure Laterality  Date   No past surgery      Current Medications: Current Meds  Medication Sig   amLODipine (NORVASC) 10 MG tablet Take 1 tablet (10 mg total) by mouth daily.   aspirin 325 MG tablet Take 325 mg by mouth daily.   atorvastatin (LIPITOR) 20 MG tablet Take 1 tablet (20 mg total) by mouth daily at 6 PM.   carvedilol (COREG) 25 MG tablet Take 1 tablet (25 mg total) by mouth 2 (two) times daily with a meal.   cloNIDine (CATAPRES) 0.3 MG tablet Take 1 tablet (0.3 mg total) by mouth 2 (two) times daily. Please schedule office visit for further refills   Iron, Ferrous Sulfate, 325 (65 Fe) MG TABS Take 325 mg by mouth  daily. (Patient taking differently: Take 325 mg by mouth once a week.)   potassium chloride SA (KLOR-CON) 20 MEQ tablet Take 1 tablet (20 mEq total) by mouth daily.     Allergies:   Lisinopril and Onion   Social History   Socioeconomic History   Marital status: Legally Separated    Spouse name: Not on file   Number of children: Not on file   Years of education: Not on file   Highest education level: Not on file  Occupational History   Not on file  Tobacco Use   Smoking status: Every Day    Packs/day: 0.50    Types: Cigarettes    Last attempt to quit: 06/06/2019    Years since quitting: 1.7   Smokeless tobacco: Never  Vaping Use   Vaping Use: Never used  Substance and Sexual Activity   Alcohol use: Yes    Comment: occ   Drug use: Yes    Types: Marijuana   Sexual activity: Not on file  Other Topics Concern   Not on file  Social History Narrative   Not on file   Social Determinants of Health   Financial Resource Strain: Not on file  Food Insecurity: Not on file  Transportation Needs: Not on file  Physical Activity: Not on file  Stress: Not on file  Social Connections: Not on file     Family History: The patient's family history includes Heart disease in his maternal grandmother; Hyperlipidemia in his mother; Hypertension in his maternal grandmother and mother. ROS:   Please see the history of present illness.    All other systems reviewed and are negative.  EKGs/Labs/Other Studies Reviewed:    The following studies were reviewed today:    Recent Labs: 01/29/2021: ALT 16; BUN 18; Creatinine, Ser 1.75; Hemoglobin 13.7; Platelets 323; Potassium 3.9; Sodium 137  Recent Lipid Panel    Component Value Date/Time   CHOL 298 (H) 05/20/2019 0627   TRIG 173 (H) 05/20/2019 0627   HDL 31 (L) 05/20/2019 0627   CHOLHDL 9.6 05/20/2019 0627   VLDL 35 05/20/2019 0627   LDLCALC 232 (H) 05/20/2019 0627    Physical Exam:    VS:  BP (!) 173/116 Comment: RE-Check  Pulse  76   Ht 6\' 1"  (1.854 m)   Wt 215 lb (97.5 kg)   SpO2 99%   BMI 28.37 kg/m     Wt Readings from Last 3 Encounters:  02/18/21 215 lb (97.5 kg)  02/02/21 214 lb 6.4 oz (97.3 kg)  01/29/21 210 lb (95.3 kg)     GEN:  Well nourished, well developed in no acute distress HEENT: Normal NECK: No JVD; No carotid bruits LYMPHATICS: No lymphadenopathy CARDIAC: RRR, no murmurs, rubs, gallops RESPIRATORY:  Clear to auscultation without rales, wheezing or rhonchi  ABDOMEN: Soft, non-tender, non-distended MUSCULOSKELETAL:  No edema; No deformity  SKIN: Warm and dry NEUROLOGIC:  Alert and oriented x 3 PSYCHIATRIC:  Normal affect    Signed, Shirlee More, MD  02/18/2021 3:34 PM    [facility]

## 2021-02-18 NOTE — Patient Instructions (Signed)
Medication Instructions:  Your physician has recommended you make the following change in your medication:  START: Minoxidil 5 mg per 0.5 tablet by mouth daily.  *If you need a refill on your cardiac medications before your next appointment, please call your pharmacy*   Lab Work: Your physician recommends that you return for lab work in: [place], Lipids If you have labs (blood work) drawn today and your tests are completely normal, you will receive your results only by: Russell Hernandez (if you have MyChart) OR A paper copy in the mail If you have any lab test that is abnormal or we need to change your treatment, we will call you to review the results.   Testing/Procedures:   Your cardiac CT will be scheduled at the below location:   [facility] [address] [place], [place] [address] [phone]  If scheduled at [facility], please arrive at the [facility] main entrance (entrance A) of [facility] 30 minutes prior to test start time. You can use the FREE valet parking offered at the main entrance (encouraged to control the heart rate for the test) Proceed to the [facility] Radiology Department (first floor) to check-in and test prep.  Please follow these instructions carefully (unless otherwise directed):  On the Night Before the Test: Be sure to Drink plenty of water. Do not consume any caffeinated/decaffeinated beverages or chocolate 12 hours prior to your test. Do not take any antihistamines 12 hours prior to your test.  On the Day of the Test: Drink plenty of water until 1 hour prior to the test. Do not eat any food 4 hours prior to the test. You may take your regular medications prior to the test.       After the Test: Drink plenty of water. After receiving IV contrast, you may experience a mild flushed feeling. This is normal. On occasion, you may experience a mild rash up to 24 hours after the test. This is not dangerous. If this  occurs, you can take Benadryl 25 mg and increase your fluid intake. If you experience trouble breathing, this can be serious. If it is severe call 911 IMMEDIATELY. If it is mild, please call our office. If you take any of these medications: Glipizide/Metformin, Avandament, Glucavance, please do not take 48 hours after completing test unless otherwise instructed.  Please allow 2-4 weeks for scheduling of routine cardiac CTs. Some insurance companies require a pre-authorization which may delay scheduling of this test.   For non-scheduling related questions, please contact the cardiac imaging nurse navigator should you have any questions/concerns: Marchia Bond, Cardiac Imaging Nurse Navigator Gordy Clement, Cardiac Imaging Nurse Navigator [facility] Direct Office Dial: [phone]   For scheduling needs, including cancellations and rescheduling, please call [place], [phone].    Follow-Up: At [facility], you and your health needs are our priority.  As part of our continuing mission to provide you with exceptional heart care, we have created designated Provider Care Teams.  These Care Teams include your primary Cardiologist (physician) and Advanced Practice Providers (APPs -  Physician Assistants and Nurse Practitioners) who all work together to provide you with the care you need, when you need it.  We recommend signing up for the patient portal called "MyChart".  Sign up information is provided on this After Visit Summary.  MyChart is used to connect with patients for Virtual Visits (Telemedicine).  Patients are able to view lab/test results, encounter notes, upcoming  appointments, etc.  Non-urgent messages can be sent to your provider as well.   To learn more about what you can do with MyChart, go to [email or web address].    Your next appointment:   4 week(s)  The format for your next appointment:   In Person  Provider:   Shirlee More, MD     Other Instructions

## 2021-02-19 ENCOUNTER — Other Ambulatory Visit (INDEPENDENT_AMBULATORY_CARE_PROVIDER_SITE_OTHER): Payer: Self-pay | Admitting: Primary Care

## 2021-02-19 ENCOUNTER — Telehealth: Payer: Self-pay

## 2021-02-19 DIAGNOSIS — J320 Chronic maxillary sinusitis: Secondary | ICD-10-CM

## 2021-02-19 LAB — COMPREHENSIVE METABOLIC PANEL
ALT: 24 IU/L (ref 0–44)
AST: 18 IU/L (ref 0–40)
Albumin/Globulin Ratio: 1.6 (ref 1.2–2.2)
Albumin: 4.7 g/dL (ref 4.0–5.0)
Alkaline Phosphatase: 63 IU/L (ref 44–121)
BUN/Creatinine Ratio: 9 (ref 9–20)
BUN: 12 mg/dL (ref 6–24)
Bilirubin Total: 0.2 mg/dL (ref 0.0–1.2)
CO2: 21 mmol/L (ref 20–29)
Calcium: 9.5 mg/dL (ref 8.7–10.2)
Chloride: 101 mmol/L (ref 96–106)
Creatinine, Ser: 1.29 mg/dL — ABNORMAL HIGH (ref 0.76–1.27)
Globulin, Total: 3 g/dL (ref 1.5–4.5)
Glucose: 116 mg/dL — ABNORMAL HIGH (ref 70–99)
Potassium: 4.3 mmol/L (ref 3.5–5.2)
Sodium: 141 mmol/L (ref 134–144)
Total Protein: 7.7 g/dL (ref 6.0–8.5)
eGFR: 71 mL/min/{1.73_m2} (ref >59)

## 2021-02-19 LAB — LIPID PANEL
Chol/HDL Ratio: 5.4 ratio — ABNORMAL HIGH (ref 0.0–5.0)
Cholesterol, Total: 150 mg/dL (ref 100–199)
HDL: 28 mg/dL — ABNORMAL LOW (ref >39)
LDL Chol Calc (NIH): 67 mg/dL (ref 0–99)
Triglycerides: 351 mg/dL — ABNORMAL HIGH (ref 0–149)
VLDL Cholesterol Cal: 55 mg/dL — ABNORMAL HIGH (ref 5–40)

## 2021-02-19 NOTE — Telephone Encounter (Signed)
Spoke with patient regarding results and recommendation.  Patient verbalizes understanding and is agreeable to plan of care. Advised patient to call back with any issues or concerns.  

## 2021-02-19 NOTE — Telephone Encounter (Signed)
Patient is requesting referral to ENT for sinus issues. Has discussed with PCP before.

## 2021-02-19 NOTE — Telephone Encounter (Signed)
-----   Message from Richardo Priest, MD sent at 02/19/2021  7:41 AM EST ----- Good results kidney function is better lipids are ideal

## 2021-02-22 ENCOUNTER — Ambulatory Visit: Payer: Self-pay | Admitting: Gastroenterology

## 2021-03-02 ENCOUNTER — Telehealth (HOSPITAL_COMMUNITY): Payer: Self-pay | Admitting: *Deleted

## 2021-03-02 MED ORDER — METOPROLOL TARTRATE 100 MG PO TABS: ORAL_TABLET | ORAL | 0 refills | Status: DC

## 2021-03-02 NOTE — Telephone Encounter (Signed)
Reaching out to patient to offer assistance regarding upcoming cardiac imaging study; pt verbalizes understanding of appt date/time, but wishes to be rescheduled. He is out of town.  Patient has been rescheduled to January 6 at 8:30am. He was made aware to arrive at 8am for his test.  Patient was asked about his HR on carvedilol and he stated that it runs in the 70s. Patient informed that metoprolol would be sent to the pharmacy for the test and he would hold  his carvedilol on his test date. He verbalized understanding.    Gordy Clement RN Navigator Cardiac Imaging [facility] [phone] office [phone] cell  Per protocol,100mg  metoprolol sent to the [facility] on file.

## 2021-03-03 ENCOUNTER — Ambulatory Visit (HOSPITAL_COMMUNITY): Admission: RE | Admit: 2021-03-03 | Payer: Self-pay | Source: Ambulatory Visit

## 2021-03-17 ENCOUNTER — Telehealth (HOSPITAL_COMMUNITY): Payer: Self-pay | Admitting: Emergency Medicine

## 2021-03-17 ENCOUNTER — Other Ambulatory Visit: Payer: Self-pay | Admitting: Cardiology

## 2021-03-17 ENCOUNTER — Other Ambulatory Visit (HOSPITAL_COMMUNITY): Payer: Self-pay | Admitting: Cardiology

## 2021-03-17 DIAGNOSIS — I712 Thoracic aortic aneurysm, without rupture, unspecified: Secondary | ICD-10-CM

## 2021-03-17 DIAGNOSIS — R079 Chest pain, unspecified: Secondary | ICD-10-CM

## 2021-03-17 MED ORDER — METOPROLOL TARTRATE 100 MG PO TABS: ORAL_TABLET | ORAL | 0 refills | Status: DC

## 2021-03-17 NOTE — Telephone Encounter (Signed)
Unable to leave VM for callback. Marchia Bond RN Navigator Cardiac Imaging [facility] [phone] Office  [phone] Cell

## 2021-03-17 NOTE — Telephone Encounter (Signed)
Reaching out to patient to offer assistance regarding upcoming cardiac imaging study; pt verbalizes understanding of appt date/time, parking situation and where to check in, pre-test NPO status and medications ordered, and verified current allergies; name and call back number provided for further questions should they arise Marchia Bond RN Navigator Cardiac Imaging [facility] [phone] office [phone] cell  100mg  metoprolol tart  Denies iv issues Arrival 8:00a

## 2021-03-19 ENCOUNTER — Other Ambulatory Visit: Payer: Self-pay

## 2021-03-19 ENCOUNTER — Ambulatory Visit (HOSPITAL_COMMUNITY)
Admission: RE | Admit: 2021-03-19 | Discharge: 2021-03-19 | Disposition: A | Discharge status: Home / Self Care | Payer: Self-pay | Source: Ambulatory Visit | Attending: Cardiology | Admitting: Cardiology

## 2021-03-19 DIAGNOSIS — R079 Chest pain, unspecified: Secondary | ICD-10-CM | POA: Insufficient documentation

## 2021-03-19 DIAGNOSIS — I712 Thoracic aortic aneurysm, without rupture, unspecified: Secondary | ICD-10-CM | POA: Insufficient documentation

## 2021-03-19 MED ORDER — NITROGLYCERIN 0.4 MG SL SUBL
0.8000 mg | SUBLINGUAL_TABLET | Freq: Once | SUBLINGUAL | Status: AC
  Administered 2021-03-19: 0.8 mg via SUBLINGUAL

## 2021-03-19 MED ORDER — NITROGLYCERIN 0.4 MG SL SUBL
SUBLINGUAL_TABLET | SUBLINGUAL | Status: AC
  Filled 2021-03-19: qty 2

## 2021-03-19 MED ORDER — IOHEXOL 350 MG/ML SOLN
100.0000 mL | Freq: Once | INTRAVENOUS | Status: AC | PRN
  Administered 2021-03-19: 100 mL via INTRAVENOUS

## 2021-03-31 ENCOUNTER — Ambulatory Visit: Payer: Self-pay | Admitting: Gastroenterology

## 2021-04-06 ENCOUNTER — Ambulatory Visit: Payer: Self-pay | Admitting: Cardiology

## 2021-04-06 NOTE — Progress Notes (Deleted)
Cardiology Office Note:    Date:  04/06/2021   ID:  Russell Hernandez, DOB [birth year], MRN [id number]  PCP:  [name], NP  Cardiologist:  Shirlee More, MD    Referring MD: [name], NP    ASSESSMENT:    No diagnosis found. PLAN:    In order of problems listed above:  ***   Next appointment: ***   Medication Adjustments/Labs and Tests Ordered: Current medicines are reviewed at length with the patient today.  Concerns regarding medicines are outlined above.  No orders of the defined types were placed in this encounter.  No orders of the defined types were placed in this encounter.   No chief complaint on file.   History of Present Illness:    Russell Hernandez is a 41 y.o. male with a hx of difficult to control hypertension with LVH and CKD stage III last seen 02/18/2021 with chest pain and initiated minoxidil added diuretic for poorly controlled hypertension. Compliance with diet, lifestyle and medications: ***  He had cardiac CTA reported 03/19/2021 with a calcium score of 5 and minimal coronary atherosclerosi stenosis.  The aorta was normal in size without calcification s with a small focal mixed calcific plaque in the second diagonal branch less than 25% stenosis.  Echocardiogram performed 05/20/2019 showed moderate LVH EF estimated 45 to 40% grade 1 diastolic dysfunction normal right ventricular size and function and no significant valvular abnormality Past Medical History:  Diagnosis Date   AKI (acute kidney injury) ([place]) 05/20/2019   Depressed left ventricular ejection fraction 06/13/2019   Hypertension    Hypertensive urgency 05/19/2019   Hypokalemia 05/20/2019   Stage 3a chronic kidney disease ([place]) 06/13/2019   TIA (transient ischemic attack) 05/21/2019    Past Surgical History:  Procedure Laterality Date   No past surgery      Current Medications: No outpatient medications have been marked as taking for the 04/06/21 encounter  (Appointment) with Richardo Priest, MD.     Allergies:   Lisinopril and Onion   Social History   Socioeconomic History   Marital status: Legally Separated    Spouse name: Not on file   Number of children: Not on file   Years of education: Not on file   Highest education level: Not on file  Occupational History   Not on file  Tobacco Use   Smoking status: Every Day    Packs/day: 0.50    Types: Cigarettes    Last attempt to quit: 06/06/2019    Years since quitting: 1.8   Smokeless tobacco: Never  Vaping Use   Vaping Use: Never used  Substance and Sexual Activity   Alcohol use: Yes    Comment: occ   Drug use: Yes    Types: Marijuana   Sexual activity: Not on file  Other Topics Concern   Not on file  Social History Narrative   Not on file   Social Determinants of Health   Financial Resource Strain: Not on file  Food Insecurity: Not on file  Transportation Needs: Not on file  Physical Activity: Not on file  Stress: Not on file  Social Connections: Not on file     Family History: The patient's ***family history includes Heart disease in his maternal grandmother; Hyperlipidemia in his mother; Hypertension in his maternal grandmother and mother. ROS:   Please see the history of present illness.    All other systems reviewed and are negative.  EKGs/Labs/Other Studies Reviewed:  The following studies were reviewed today:  EKG:  EKG ordered today and personally reviewed.  The ekg ordered today demonstrates ***  Recent Labs: 01/29/2021: Hemoglobin 13.7; Platelets 323 02/18/2021: ALT 24; BUN 12; Creatinine, Ser 1.29; Potassium 4.3; Sodium 141  Recent Lipid Panel    Component Value Date/Time   CHOL 150 02/18/2021 1544   TRIG 351 (H) 02/18/2021 1544   HDL 28 (L) 02/18/2021 1544   CHOLHDL 5.4 (H) 02/18/2021 1544   CHOLHDL 9.6 05/20/2019 0627   VLDL 35 05/20/2019 0627   LDLCALC 67 02/18/2021 1544    Physical Exam:    VS:  There were no vitals taken for this  visit.    Wt Readings from Last 3 Encounters:  02/18/21 215 lb (97.5 kg)  02/02/21 214 lb 6.4 oz (97.3 kg)  01/29/21 210 lb (95.3 kg)     GEN: *** Well nourished, well developed in no acute distress HEENT: Normal NECK: No JVD; No carotid bruits LYMPHATICS: No lymphadenopathy CARDIAC: ***RRR, no murmurs, rubs, gallops RESPIRATORY:  Clear to auscultation without rales, wheezing or rhonchi  ABDOMEN: Soft, non-tender, non-distended MUSCULOSKELETAL:  No edema; No deformity  SKIN: Warm and dry NEUROLOGIC:  Alert and oriented x 3 PSYCHIATRIC:  Normal affect    Signed, Shirlee More, MD  04/06/2021 11:45 AM    [place]

## 2021-04-13 ENCOUNTER — Emergency Department (HOSPITAL_BASED_OUTPATIENT_CLINIC_OR_DEPARTMENT_OTHER)
Admission: EM | Admit: 2021-04-13 | Discharge: 2021-04-13 | Disposition: A | Discharge status: Home / Self Care | Payer: 59 | Attending: Emergency Medicine | Admitting: Emergency Medicine

## 2021-04-13 ENCOUNTER — Other Ambulatory Visit: Payer: Self-pay

## 2021-04-13 ENCOUNTER — Encounter (HOSPITAL_BASED_OUTPATIENT_CLINIC_OR_DEPARTMENT_OTHER): Payer: Self-pay

## 2021-04-13 ENCOUNTER — Emergency Department (HOSPITAL_BASED_OUTPATIENT_CLINIC_OR_DEPARTMENT_OTHER): Payer: 59

## 2021-04-13 DIAGNOSIS — Y990 Civilian activity done for income or pay: Secondary | ICD-10-CM | POA: Diagnosis not present

## 2021-04-13 DIAGNOSIS — R079 Chest pain, unspecified: Secondary | ICD-10-CM | POA: Diagnosis not present

## 2021-04-13 DIAGNOSIS — K0889 Other specified disorders of teeth and supporting structures: Secondary | ICD-10-CM | POA: Diagnosis present

## 2021-04-13 DIAGNOSIS — Z7982 Long term (current) use of aspirin: Secondary | ICD-10-CM | POA: Insufficient documentation

## 2021-04-13 DIAGNOSIS — I10 Essential (primary) hypertension: Secondary | ICD-10-CM | POA: Diagnosis not present

## 2021-04-13 DIAGNOSIS — X500XXA Overexertion from strenuous movement or load, initial encounter: Secondary | ICD-10-CM | POA: Diagnosis not present

## 2021-04-13 DIAGNOSIS — Z79899 Other long term (current) drug therapy: Secondary | ICD-10-CM | POA: Insufficient documentation

## 2021-04-13 DIAGNOSIS — K029 Dental caries, unspecified: Secondary | ICD-10-CM | POA: Diagnosis not present

## 2021-04-13 DIAGNOSIS — K0381 Cracked tooth: Secondary | ICD-10-CM | POA: Diagnosis not present

## 2021-04-13 DIAGNOSIS — M25512 Pain in left shoulder: Secondary | ICD-10-CM | POA: Insufficient documentation

## 2021-04-13 LAB — BASIC METABOLIC PANEL
Anion gap: 7 (ref 5–15)
BUN: 15 mg/dL (ref 6–20)
CO2: 28 mmol/L (ref 22–32)
Calcium: 9.4 mg/dL (ref 8.9–10.3)
Chloride: 103 mmol/L (ref 98–111)
Creatinine, Ser: 1.55 mg/dL — ABNORMAL HIGH (ref 0.61–1.24)
GFR, Estimated: 57 mL/min — ABNORMAL LOW (ref >60)
Glucose, Bld: 108 mg/dL — ABNORMAL HIGH (ref 70–99)
Potassium: 3.8 mmol/L (ref 3.5–5.1)
Sodium: 138 mmol/L (ref 135–145)

## 2021-04-13 LAB — CBC
HCT: 45.9 % (ref 39.0–52.0)
Hemoglobin: 15.5 g/dL (ref 13.0–17.0)
MCH: 29.7 pg (ref 26.0–34.0)
MCHC: 33.8 g/dL (ref 30.0–36.0)
MCV: 87.9 fL (ref 80.0–100.0)
Platelets: 370 10*3/uL (ref 150–400)
RBC: 5.22 MIL/uL (ref 4.22–5.81)
RDW: 15 % (ref 11.5–15.5)
WBC: 8.1 10*3/uL (ref 4.0–10.5)
nRBC: 0 % (ref 0.0–0.2)

## 2021-04-13 LAB — TROPONIN I (HIGH SENSITIVITY): Troponin I (High Sensitivity): 6 ng/L (ref <18)

## 2021-04-13 MED ORDER — AMOXICILLIN-POT CLAVULANATE 875-125 MG PO TABS: 1.0000 | ORAL_TABLET | Freq: Two times a day (BID) | ORAL | 0 refills | Status: DC

## 2021-04-13 MED ORDER — MORPHINE SULFATE (PF) 2 MG/ML IV SOLN
2.0000 mg | Freq: Once | INTRAVENOUS | Status: AC
Start: 2021-04-13 — End: 2021-04-13
  Administered 2021-04-13: 2 mg via INTRAVENOUS
  Filled 2021-04-13: qty 1

## 2021-04-13 MED ORDER — HYDROCODONE-ACETAMINOPHEN 5-325 MG PO TABS
1.0000 | ORAL_TABLET | Freq: Four times a day (QID) | ORAL | 0 refills | Status: DC | PRN
Start: 2021-04-13 — End: 2023-02-10

## 2021-04-13 MED ORDER — METHOCARBAMOL 500 MG PO TABS: 500.0000 mg | ORAL_TABLET | Freq: Two times a day (BID) | ORAL | 0 refills | Status: DC | PRN

## 2021-04-13 NOTE — ED Triage Notes (Addendum)
Pt states he cracked a tooth right upper 2 weeks ago-NAD-steady gait-pt later added c/o left shoulder pain x 1 week-denies injury

## 2021-04-13 NOTE — ED Provider Notes (Signed)
[facility] Provider Note   CSN: [id number] Arrival date & time: 04/13/21  1146     History  Chief Complaint  Patient presents with   Dental Pain   Shoulder Pain    [name] is a 41 y.o. male this is a 41 year old gentleman with a past medical history significant for hypertension, recurrent chest pain without explanation, hypercholesterolemia who presents with concern for 2 different problems.  Patient has known dental disease in the right upper jaw with known broken teeth, known cavities, dental appointment scheduled in the next month.  Patient concern for ongoing pain, sweating, increased gum swelling, increased pain.  Patient taking Tylenol with minimal relief.  Patient also concerned about some left shoulder pain radiating to the chest, last acute episode was 6 hours ago, denies shoulder pain worse with exertion, denies shortness of breath, nausea, vomiting, does endorse transient diaphoresis.  He is currently followed closely by cardiology due to unexplainable chest pain, no positive stress test, no stents placed as yet.  Patient wondering whether it has to do anxiety but is concerned that he may have had some cardiac chest pain today.   Dental Pain Shoulder Pain     Home Medications Prior to Admission medications   Medication Sig Start Date End Date Taking? Authorizing Provider  amoxicillin-clavulanate (AUGMENTIN) 875-125 MG tablet Take 1 tablet by mouth every 12 (twelve) hours. 04/13/21  Yes Leanda Padmore H, PA-C  HYDROcodone-acetaminophen (NORCO/VICODIN) 5-325 MG tablet Take 1-2 tablets by mouth every 6 (six) hours as needed. 04/13/21  Yes Tajae Maiolo H, PA-C  methocarbamol (ROBAXIN) 500 MG tablet Take 1 tablet (500 mg total) by mouth 2 (two) times daily as needed for muscle spasms. 04/13/21  Yes Vignesh Willert H, PA-C  amLODipine (NORVASC) 10 MG tablet Take 1 tablet (10 mg total) by mouth daily. 10/12/20 04/10/21  Tobb,  Kardie, DO  aspirin 325 MG tablet Take 325 mg by mouth daily.    [provider]  atorvastatin (LIPITOR) 20 MG tablet Take 1 tablet (20 mg total) by mouth daily at 6 PM. 10/12/20   Tobb, Kardie, DO  carvedilol (COREG) 25 MG tablet Take 1 tablet (25 mg total) by mouth 2 (two) times daily with a meal. 10/12/20   Tobb, Kardie, DO  cloNIDine (CATAPRES) 0.3 MG tablet Take 1 tablet (0.3 mg total) by mouth 2 (two) times daily. Please schedule office visit for further refills 10/12/20   Tobb, Kardie, DO  Iron, Ferrous Sulfate, 325 (65 Fe) MG TABS Take 325 mg by mouth daily. Patient taking differently: Take 325 mg by mouth once a week. 12/24/19   [name], MD  metoprolol tartrate (LOPRESSOR) 100 MG tablet Take 1 tablet (100mg ) TWO hours prior to cardiac CT scan. Do not take Carvedilol. 03/17/21   Richardo Priest, MD  minoxidil (LONITEN) 10 MG tablet Take 0.5 tablets (5 mg total) by mouth daily. 02/18/21   Richardo Priest, MD  potassium chloride SA (KLOR-CON) 20 MEQ tablet Take 1 tablet (20 mEq total) by mouth daily. 09/07/20   [name], NP  hydrochlorothiazide (HYDRODIURIL) 25 MG tablet Take 1 tablet (25 mg total) by mouth daily. 12/10/16 02/05/19  [name], MD  lisinopril (PRINIVIL,ZESTRIL) 10 MG tablet Take 1 tablet (10 mg total) by mouth daily. 12/10/16 01/17/20  [name], MD  omeprazole (PRILOSEC) 20 MG capsule Take 1 capsule (20 mg total) by mouth daily. 12/11/16 02/05/19  Doristine Devoid, PA-C  ranitidine (ZANTAC) 150 MG tablet  Take 1 tablet (150 mg total) by mouth 2 (two) times daily. 12/13/16 02/05/19  [name], [name], MD      Allergies    Lisinopril and Onion    Review of Systems   Review of Systems  HENT:  Positive for dental problem.   Cardiovascular:  Positive for chest pain.  Musculoskeletal:  Positive for myalgias.  All other systems reviewed and are negative.  Physical Exam Updated Vital Signs BP (!) 161/96    Pulse (!) 57    Temp 98.2 F (36.8 C) (Oral)     Resp 12    Ht 6\' 1"  (1.854 m)    Wt 97.5 kg    SpO2 100%    BMI 28.37 kg/m  Physical Exam Vitals and nursing note reviewed.  Constitutional:      General: He is not in acute distress.    Appearance: Normal appearance.  HENT:     Head: Normocephalic and atraumatic.     Mouth/Throat:     Comments: Patient with significant dental caries, especially on the right side with noticed cracked tooth.  There is redness and irritation of the gums without any area of fluctuance.  There is no area of fluctuance or fluid noted on the roof of mouth.  No peritonsillar abscess noted, clear posterior oropharynx.  No redness or edema of the floor of the mouth. Eyes:     General:        Right eye: No discharge.        Left eye: No discharge.  Cardiovascular:     Rate and Rhythm: Normal rate and regular rhythm.     Heart sounds: No murmur heard.   No friction rub. No gallop.  Pulmonary:     Effort: Pulmonary effort is normal.     Breath sounds: Normal breath sounds.  Abdominal:     General: Bowel sounds are normal.     Palpations: Abdomen is soft.     Comments: No tenderness to palpation throughout the abdomen.  No rebound, rigidity, guarding.  Normal bowel sounds throughout.   Musculoskeletal:     Comments: Intact strength 5/5 of bilateral upper extremities.  Minimal tenderness to palpation left shoulder bony prominences.  Skin:    General: Skin is warm and dry.     Capillary Refill: Capillary refill takes less than 2 seconds.  Neurological:     Mental Status: He is alert and oriented to person, place, and time.  Psychiatric:        Mood and Affect: Mood normal.        Behavior: Behavior normal.    ED Results / Procedures / Treatments   Labs (all labs ordered are listed, but only abnormal results are displayed) Labs Reviewed  BASIC METABOLIC PANEL - Abnormal; Notable for the following components:      Result Value   Glucose, Bld 108 (*)    Creatinine, Ser 1.55 (*)    GFR, Estimated 57 (*)     All other components within normal limits  CBC  TROPONIN I (HIGH SENSITIVITY)    EKG EKG Interpretation  Date/Time:  Tuesday April 13 2021 15:23:30 EST Ventricular Rate:  56 PR Interval:  157 QRS Duration: 107 QT Interval:  447 QTC Calculation: 432 R Axis:   1 Text Interpretation: Sinus rhythm Probable anteroseptal infarct, old ST changes similar to Nov 2022 and Oct 2022 Confirmed by Sherwood Gambler [phone]) on 04/13/2021 4:20:17 PM  Radiology DG Chest 2 View  Result Date: 04/13/2021 CLINICAL DATA:  Chest pain EXAM: CHEST - 2 VIEW COMPARISON:  01/27/2021 FINDINGS: The heart size and mediastinal contours are within normal limits. Both lungs are clear. The visualized skeletal structures are unremarkable. IMPRESSION: No active cardiopulmonary disease. Electronically Signed   By: Franchot Gallo M.D.   On: 04/13/2021 15:22    Procedures Procedures    Medications Ordered in ED Medications  morphine 2 MG/ML injection 2 mg (2 mg Intravenous Given 04/13/21 1537)    ED Course/ Medical Decision Making/ A&P                            Medical Decision Making Amount and/or Complexity of Data Reviewed Labs: ordered. Radiology: ordered.  Risk Prescription drug management.   Given the large differential diagnosis for ADIL TUGWELL, the decision making in this case is of high complexity.  After evaluating all of the data points in this case, the presentation of TARICK PARENTEAU is NOT consistent with Acute Coronary Syndrome (ACS) and/or myocardial ischemia, pulmonary embolism, aortic dissection; Borhaave's, significant arrythmia, pneumothorax, cardiac tamponade, or other emergent cardiopulmonary condition.  Further, the presentation of DIRCK BUTCH is NOT consistent with pericarditis, myocarditis, cholecystitis, pancreatitis, mediastinitis, endocarditis, new valvular disease.  Additionally, the presentation of CAYDIN YEATTS is NOT consistent with flail chest,  cardiac contusion, ARDS, or significant intra-thoracic or intra-abdominal bleeding.  Moreover, this presentation is NOT consistent with pneumonia, sepsis, or pyelonephritis.  The patient has a ongoing concern for cardiac causes of chest pain despite current evaluation by cardiologist, multiple negative work-ups for ACS related chest pain in the past to the emergency [facility].  I independently reviewed previous ED visits, and cardiology notes.  As patient is increasingly concerned, reports that he had some diaphoresis with left shoulder pain that radiated towards the chest earlier today around 6 hours ago we will again rule out ACS at this time.  His CBC is unremarkable, BMP with mild hyperglycemia, elevated kidney function that is stable compared to previous chronic kidney disease.  He has negative troponin x1 in context of chest pain that occurred 6 hours ago, and has since resolved.  I independently interpreted and reviewed chest x-ray which is significant for no significant intrathoracic abnormality.  His EKG shows some ST changes that are consistent with possible old anterolateral infarct, however these are stable compared to previous.  These findings were independently reviewed by my attending Dr. Regenia Skeeter.  Based on my evaluation patient's descriptions of heavy lifting at work, the pain in his left shoulder is most consistent with MSK pain, however he does not have any evidence of entrapment, strength deficits, significant crepitus, no step-off no deformity.  Encouraged rest, ice, compression, elevation of affected extremity.  Encourage follow-up with orthopedics.  Encouraged continued Tylenol use.  Will prescribe muscle relaxant for this pain.  Finally patient is presenting with significant right-sided dental pain secondary to known cavities.  He has a dental appointment in 1 month for removal and treatment of these cavities.  On my physical exam I do note significant poor dentition with no  evidence of Ludwig angina, peritonsillar abscess, uvula midline.  He does have some redness and irritation of the gums on the upper right side.  Will prescribe Augmentin, as well as short course of stronger pain medication to help with pain until patient can get to dental appointment.  Patient understands and agrees to all of the above, discharged in stable condition at this time.  Strict return  and follow-up precautions have been given by me personally or by detailed written instruction given verbally by nursing staff using the teach back method to the patient/family/caregiver(s).  Data Reviewed/Counseling: I have reviewed the patient's vital signs, nursing notes, and other relevant tests/information. I had a detailed discussion regarding the historical points, exam findings, and any diagnostic results supporting the discharge diagnosis. I also discussed the need for outpatient follow-up and the need to return to the ED if symptoms worsen or if there are any questions or concerns that arise at home.  Final Clinical Impression(s) / ED Diagnoses Final diagnoses:  Acute pain of left shoulder  Pain due to dental caries    Rx / DC Orders ED Discharge Orders          Ordered    HYDROcodone-acetaminophen (NORCO/VICODIN) 5-325 MG tablet  Every 6 hours PRN        04/13/21 1655    methocarbamol (ROBAXIN) 500 MG tablet  2 times daily PRN        04/13/21 1655    amoxicillin-clavulanate (AUGMENTIN) 875-125 MG tablet  Every 12 hours        04/13/21 1655              Cyndra Feinberg, [place] H, PA-C 04/13/21 1704    Sherwood Gambler, MD 04/13/21 2349

## 2021-04-13 NOTE — Discharge Instructions (Signed)
As we discussed your work-up today was reassuring for cardiac or pulmonary causes of chest pain.  I recommend that you follow-up with your cardiologist if you have ongoing concern for further evaluation.  For the shoulder pain I recommend that you take Tylenol, and the muscle relaxant we prescribed as needed, ice the affected joints, and rest between heavy lifting at work.  For your dental infection I prescribed you an antibiotic as well as some stronger pain medication you can use in place of Tylenol for breakthrough pain.  It is crucial that you follow-up to have the cavities fully treated by the dentist in a month.  Please return to the emergency department if you have worsening chest pain, fever that does not respond to Tylenol, ibuprofen.   Your blood pressure was somewhat elevated today, this may be secondary to the pain from your teeth, but please continue take your blood pressure medication, and follow-up with your primary care doctor to discuss closer management of your elevated blood pressure.

## 2021-04-21 ENCOUNTER — Encounter: Payer: Self-pay | Admitting: Gastroenterology

## 2021-04-21 ENCOUNTER — Ambulatory Visit (INDEPENDENT_AMBULATORY_CARE_PROVIDER_SITE_OTHER): Payer: Self-pay | Admitting: Gastroenterology

## 2021-04-21 VITALS — BP 168/90 | HR 60 | Ht 73.0 in | Wt 219.4 lb

## 2021-04-21 DIAGNOSIS — R0789 Other chest pain: Secondary | ICD-10-CM

## 2021-04-21 DIAGNOSIS — K219 Gastro-esophageal reflux disease without esophagitis: Secondary | ICD-10-CM

## 2021-04-21 MED ORDER — OMEPRAZOLE 20 MG PO CPDR: 20.0000 mg | DELAYED_RELEASE_CAPSULE | Freq: Every day | ORAL | 4 refills | Status: DC

## 2021-04-21 MED ORDER — FAMOTIDINE 20 MG PO TABS
20.0000 mg | ORAL_TABLET | Freq: Every day | ORAL | 4 refills | Status: DC
Start: 2021-04-21 — End: 2021-08-04

## 2021-04-21 NOTE — Patient Instructions (Signed)
If you are age 42 or older, your body mass index should be between 23-30. Your Body mass index is 28.94 kg/m. If this is out of the aforementioned range listed, please consider follow up with your Primary Care Provider.  If you are age 53 or younger, your body mass index should be between 19-25. Your Body mass index is 28.94 kg/m. If this is out of the aformentioned range listed, please consider follow up with your Primary Care Provider.   __________________________________________________________  The [facility] providers would like to encourage you to use [facility] to communicate with providers for non-urgent requests or questions.  Due to long hold times on the telephone, sending your provider a message by [facility] may be a faster and more efficient way to get a response.  Please allow 48 business hours for a response.  Please remember that this is for non-urgent requests.   We have sent the following medications to your pharmacy for you to pick up at your convenience:  Omeprazole 20mg  daily, Prilosec 20mg  at bedtime  Cut down on sodas, quit smoking  It was a pleasure to see you today!  [name], M.D.

## 2021-04-21 NOTE — Progress Notes (Signed)
Chief Complaint: For GI evaluation  Referring Provider:  [name], NP      ASSESSMENT AND PLAN;   #1. GERD  #2. Atypical chest pains   Plan: -Omeprazole 20mg  po QD #90, 4 RF -Pepcid 20mg  po QHS #90, 4RF -EGD for further eval -Stop smoking. -Cut down on sodas. -If still with problems, [place] complete. -D/W family.  Proceed with EGD. I have discussed the risks and benefits. The risks including rare risk of perforation, bleeding, missed UGI neoplasms, risks of anesthesia/sedation. Alternatives were given. Patient is aware and agrees to proceed. All the questions were answered. This will be scheduled in upcoming days. Consent forms were given for review.  HPI:    Russell Hernandez is a 41 y.o. male  With CKD3, HTN, HLD, smoking, marijuana use  Multiple visits to ED with chest pain with significant heartburn Last visit April 13, 2021 Negative EKG, labs, CTA chest, chest x-ray, MI ruled out. Nl CBC, CMP, lipase Had negative echo previously except for EF 40 to 45% Seen by Dr. Bettina Gavia from cardiology-atypical chest pains  Recommended GI evaluation.  Has longstanding history of heartburn without odynophagia or dysphagia. Chest pains at times occur after eating-he could not identify any particular triggering factors. Omeprazole did help but he is only been taking it intermittently Has regurgitation No significant nocturnal symptoms Continues to smoke and use marijuana despite medical advice  Denies having any diarrhea, constipation, melena or hematochezia. No significant abdominal pain.  CTA neg 03/2021 Past Medical History:  Diagnosis Date   AKI (acute kidney injury) ([place]) 05/20/2019   Depressed left ventricular ejection fraction 06/13/2019   Hypertension    Hypertensive urgency 05/19/2019   Hypokalemia 05/20/2019   Stage 3a chronic kidney disease ([place]) 06/13/2019   TIA (transient ischemic attack) 05/21/2019    Past Surgical History:  Procedure Laterality  Date   No past surgery      Family History  Problem Relation Age of Onset   Hypertension Mother    Hyperlipidemia Mother    Hypertension Maternal Grandmother    Heart disease Maternal Grandmother    Colon cancer Neg Hx    Rectal cancer Neg Hx    Stomach cancer Neg Hx    Esophageal cancer Neg Hx    Pancreatic cancer Neg Hx     Social History   Tobacco Use   Smoking status: Every Day    Packs/day: 0.50    Types: Cigarettes   Smokeless tobacco: Never  Vaping Use   Vaping Use: Never used  Substance Use Topics   Alcohol use: Yes    Alcohol/week: 2.0 standard drinks    Types: 2 Shots of liquor per week   Drug use: Yes    Types: Marijuana    Current Outpatient Medications  Medication Sig Dispense Refill   amoxicillin-clavulanate (AUGMENTIN) 875-125 MG tablet Take 1 tablet by mouth every 12 (twelve) hours. 14 tablet 0   aspirin 325 MG tablet Take 325 mg by mouth daily.     atorvastatin (LIPITOR) 20 MG tablet Take 1 tablet (20 mg total) by mouth daily at 6 PM. 90 tablet 3   carvedilol (COREG) 25 MG tablet Take 1 tablet (25 mg total) by mouth 2 (two) times daily with a meal. 180 tablet 3   cloNIDine (CATAPRES) 0.3 MG tablet Take 1 tablet (0.3 mg total) by mouth 2 (two) times daily. Please schedule office visit for further refills 180 tablet 3   HYDROcodone-acetaminophen (NORCO/VICODIN) 5-325 MG tablet  Take 1-2 tablets by mouth every 6 (six) hours as needed. 10 tablet 0   Iron, Ferrous Sulfate, 325 (65 Fe) MG TABS Take 325 mg by mouth daily. (Patient taking differently: Take 325 mg by mouth once a week.) 30 tablet 6   methocarbamol (ROBAXIN) 500 MG tablet Take 1 tablet (500 mg total) by mouth 2 (two) times daily as needed for muscle spasms. 20 tablet 0   minoxidil (LONITEN) 10 MG tablet Take 0.5 tablets (5 mg total) by mouth daily. 45 tablet 3   potassium chloride SA (KLOR-CON) 20 MEQ tablet Take 1 tablet (20 mEq total) by mouth daily. 30 tablet 0   amLODipine (NORVASC) 10 MG  tablet Take 1 tablet (10 mg total) by mouth daily. 90 tablet 3   No current facility-administered medications for this visit.    Allergies  Allergen Reactions   Lisinopril Anaphylaxis    angioedema   Onion Other (See Comments)    Sinus congestion    Review of Systems:  Constitutional: Denies fever, chills, diaphoresis, appetite change and fatigue.  HEENT: Has allergies Respiratory: Denies SOB, DOE, cough, chest tightness,  and wheezing.   Cardiovascular: Denies chest pain, palpitations and leg swelling.  Genitourinary: Denies dysuria, urgency, frequency, hematuria, flank pain and difficulty urinating.  Musculoskeletal: Denies myalgias, back pain, joint swelling, arthralgias and gait problem.  Skin: No rash.  Neurological: Denies dizziness, seizures, syncope, weakness, light-headedness, numbness and headaches.  Hematological: Denies adenopathy. Easy bruising, personal or family bleeding history  Psychiatric/Behavioral: No anxiety or depression     Physical Exam:    BP (!) 168/90    Pulse 60    Ht 6\' 1"  (1.854 m)    Wt 219 lb 6 oz (99.5 kg)    SpO2 98%    BMI 28.94 kg/m  Wt Readings from Last 3 Encounters:  04/21/21 219 lb 6 oz (99.5 kg)  04/13/21 215 lb (97.5 kg)  02/18/21 215 lb (97.5 kg)   Constitutional:  Well-developed, in no acute distress. Psychiatric: Normal mood and affect. Behavior is normal. HEENT: Pupils normal.  Conjunctivae are normal. No scleral icterus. Neck supple.  Cardiovascular: Normal rate, regular rhythm. No edema Pulmonary/chest: Effort normal and breath sounds normal. No wheezing, rales or rhonchi. Abdominal: Soft, nondistended. Nontender. Bowel sounds active throughout. There are no masses palpable. No hepatomegaly. Rectal: Deferred Neurological: Alert and oriented to person place and time. Skin: Skin is warm and dry. No rashes noted.  Data Reviewed: I have personally reviewed following labs and imaging studies  CBC: CBC Latest Ref Rng & Units  04/13/2021 01/29/2021 01/27/2021  WBC 4.0 - 10.5 K/uL 8.1 8.2 8.0  Hemoglobin 13.0 - 17.0 g/dL 15.5 13.7 15.0  Hematocrit 39.0 - 52.0 % 45.9 41.6 45.0  Platelets 150 - 400 K/uL 370 323 357    CMP: CMP Latest Ref Rng & Units 04/13/2021 02/18/2021 01/29/2021  Glucose 70 - 99 mg/dL 108(H) 116(H) 115(H)  BUN 6 - 20 mg/dL 15 12 18   Creatinine 0.61 - 1.24 mg/dL 1.55(H) 1.29(H) 1.75(H)  Sodium 135 - 145 mmol/L 138 141 137  Potassium 3.5 - 5.1 mmol/L 3.8 4.3 3.9  Chloride 98 - 111 mmol/L 103 101 105  CO2 22 - 32 mmol/L 28 21 24   Calcium 8.9 - 10.3 mg/dL 9.4 9.5 8.6(L)  Total Protein 6.0 - 8.5 g/dL - 7.7 7.2  Total Bilirubin 0.0 - 1.2 mg/dL - 0.2 0.3  Alkaline Phos 44 - 121 IU/L - 63 43  AST 0 - 40 IU/L -  18 13(L)  ALT 0 - 44 IU/L - 24 16     Radiology Studies: DG Chest 2 View  Result Date: 04/13/2021 CLINICAL DATA:  Chest pain EXAM: CHEST - 2 VIEW COMPARISON:  01/27/2021 FINDINGS: The heart size and mediastinal contours are within normal limits. Both lungs are clear. The visualized skeletal structures are unremarkable. IMPRESSION: No active cardiopulmonary disease. Electronically Signed   By: Franchot Gallo M.D.   On: 04/13/2021 15:22      Carmell Austria, MD 04/21/2021, 3:19 PM  Cc: [name], NP

## 2021-06-08 ENCOUNTER — Encounter: Payer: 59 | Admitting: Gastroenterology

## 2021-06-08 ENCOUNTER — Telehealth (INDEPENDENT_AMBULATORY_CARE_PROVIDER_SITE_OTHER): Payer: Self-pay | Admitting: Primary Care

## 2021-06-08 ENCOUNTER — Telehealth: Payer: Self-pay | Admitting: Gastroenterology

## 2021-06-08 NOTE — Telephone Encounter (Signed)
Patient called to cancel colonoscopy for today. Per patient, he is sick and cannot make it.  ?

## 2021-06-08 NOTE — Telephone Encounter (Signed)
Patient has been contacted and scheduled for 3/29.  ?

## 2021-06-08 NOTE — Telephone Encounter (Signed)
Copied from [place] [phone] [id number]. Topic: General - Other ?>> Jun 08, 2021 11:03 AM Leward Quan A wrote: ?Reason for CRM: Patient called in and stated that he need to speak to Ms Oletta Lamas about his BP medication and the fact that the medication is not working like it should. Patient was offered an appointment for 06/29/21 which he refused since it was so far down the way but was very upset since he stated that he have called in several times and have not been able to speak to Ms Oletta Lamas or her nurse. Patient is waiting on a call back with an earlier appointment time than 06/29/21. Can be reached at Ph# [phone] ?

## 2021-06-09 ENCOUNTER — Ambulatory Visit (INDEPENDENT_AMBULATORY_CARE_PROVIDER_SITE_OTHER): Payer: 59 | Admitting: Primary Care

## 2021-06-09 ENCOUNTER — Encounter (INDEPENDENT_AMBULATORY_CARE_PROVIDER_SITE_OTHER): Payer: Self-pay | Admitting: Primary Care

## 2021-06-09 VITALS — BP 191/115 | HR 68 | Temp 98.0°F | Ht 73.0 in | Wt 215.8 lb

## 2021-06-09 DIAGNOSIS — Z131 Encounter for screening for diabetes mellitus: Secondary | ICD-10-CM

## 2021-06-09 DIAGNOSIS — I10 Essential (primary) hypertension: Secondary | ICD-10-CM | POA: Diagnosis not present

## 2021-06-09 DIAGNOSIS — N1831 Chronic kidney disease, stage 3a: Secondary | ICD-10-CM | POA: Diagnosis not present

## 2021-06-09 LAB — POCT GLYCOSYLATED HEMOGLOBIN (HGB A1C): Hemoglobin A1C: 6.6 % — AB (ref 4.0–5.6)

## 2021-06-09 NOTE — Progress Notes (Signed)
?[place] ? ? ?Russell Hernandez is a 41 y.o. male presents for hypertension evaluation, Denies shortness of breath, headaches, chest pain  on right side of chest no radiating factor neck, arm or back or lower extremity edema, sudden onset, vision changes, unilateral weakness, dizziness, paresthesias . He is upset that his Bp has been elevated and states has called this office at least 10 times and spoke with the practice administrator unable to provide a name about not returning his calls. Reviewed chart no CRM except the one the day prior. He is supposed to have Bp managed by cardiology but has not been going. Called Dr. Marcelino Freestone reached nurse at another site informed her about patient Bp and sent message to Dr. Steward Ros.He is scheduled for 06/15/21 at 2:00 pm. Patient decides what Bp medications he is or is not taking.  ?Past Medical History:  ?Diagnosis Date  ? AKI (acute kidney injury) ([employer]) 05/20/2019  ? Depressed left ventricular ejection fraction 06/13/2019  ? Hypertension   ? Hypertensive urgency 05/19/2019  ? Hypokalemia 05/20/2019  ? Stage 3a chronic kidney disease ([place]) 06/13/2019  ? TIA (transient ischemic attack) 05/21/2019  ? ?Past Surgical History:  ?Procedure Laterality Date  ? No past surgery    ? ?Allergies  ?Allergen Reactions  ? Lisinopril Anaphylaxis  ?  angioedema  ? Onion Other (See Comments)  ?  Sinus congestion  ? ?Current Outpatient Medications on File Prior to Visit  ?Medication Sig Dispense Refill  ? aspirin 325 MG tablet Take 325 mg by mouth daily.    ? atorvastatin (LIPITOR) 20 MG tablet Take 1 tablet (20 mg total) by mouth daily at 6 PM. 90 tablet 3  ? carvedilol (COREG) 25 MG tablet Take 1 tablet (25 mg total) by mouth 2 (two) times daily with a meal. 180 tablet 3  ? cloNIDine (CATAPRES) 0.3 MG tablet Take 1 tablet (0.3 mg total) by mouth 2 (two) times daily. Please schedule office visit for further refills 180 tablet 3  ? famotidine (PEPCID) 20 MG tablet Take 1 tablet (20 mg  total) by mouth at bedtime. 90 tablet 4  ? HYDROcodone-acetaminophen (NORCO/VICODIN) 5-325 MG tablet Take 1-2 tablets by mouth every 6 (six) hours as needed. 10 tablet 0  ? Iron, Ferrous Sulfate, 325 (65 Fe) MG TABS Take 325 mg by mouth daily. (Patient taking differently: Take 325 mg by mouth once a week.) 30 tablet 6  ? methocarbamol (ROBAXIN) 500 MG tablet Take 1 tablet (500 mg total) by mouth 2 (two) times daily as needed for muscle spasms. 20 tablet 0  ? minoxidil (LONITEN) 10 MG tablet Take 0.5 tablets (5 mg total) by mouth daily. 45 tablet 3  ? omeprazole (PRILOSEC) 20 MG capsule Take 1 capsule (20 mg total) by mouth daily. 90 capsule 4  ? potassium chloride SA (KLOR-CON) 20 MEQ tablet Take 1 tablet (20 mEq total) by mouth daily. 30 tablet 0  ? amLODipine (NORVASC) 10 MG tablet Take 1 tablet (10 mg total) by mouth daily. 90 tablet 3  ? [DISCONTINUED] hydrochlorothiazide (HYDRODIURIL) 25 MG tablet Take 1 tablet (25 mg total) by mouth daily. 30 tablet 2  ? [DISCONTINUED] lisinopril (PRINIVIL,ZESTRIL) 10 MG tablet Take 1 tablet (10 mg total) by mouth daily. 30 tablet 0  ? [DISCONTINUED] ranitidine (ZANTAC) 150 MG tablet Take 1 tablet (150 mg total) by mouth 2 (two) times daily. 60 tablet 0  ? ?No current facility-administered medications on file prior to visit.  ? ?Social History  ? ?  Socioeconomic History  ? Marital status: Married  ?  Spouse name: Not on file  ? Number of children: 4  ? Years of education: Not on file  ? Highest education level: Not on file  ?Occupational History  ? Occupation: General labor  ?Tobacco Use  ? Smoking status: Every Day  ?  Packs/day: 0.50  ?  Types: Cigarettes  ? Smokeless tobacco: Never  ?Vaping Use  ? Vaping Use: Never used  ?Substance and Sexual Activity  ? Alcohol use: Yes  ?  Alcohol/week: 2.0 standard drinks  ?  Types: 2 Shots of liquor per week  ? Drug use: Yes  ?  Types: Marijuana  ? Sexual activity: Not on file  ?Other Topics Concern  ? Not on file  ?Social History  Narrative  ? Not on file  ? ?Social Determinants of Health  ? ?Financial Resource Strain: Not on file  ?Food Insecurity: Not on file  ?Transportation Needs: Not on file  ?Physical Activity: Not on file  ?Stress: Not on file  ?Social Connections: Not on file  ?Intimate Partner Violence: Not on file  ? ?Family History  ?Problem Relation Age of Onset  ? Hypertension Mother   ? Hyperlipidemia Mother   ? Hypertension Maternal Grandmother   ? Heart disease Maternal Grandmother   ? Colon cancer Neg Hx   ? Rectal cancer Neg Hx   ? Stomach cancer Neg Hx   ? Esophageal cancer Neg Hx   ? Pancreatic cancer Neg Hx   ? ? ? ?OBJECTIVE: ? ?Vitals:  ? 06/09/21 1445 06/09/21 1456  ?BP: (!) 174/109 (!) 191/115  ?Pulse: 65 68  ?Temp: 98 ?F (36.7 ?C)   ?TempSrc: Oral   ?SpO2: 97%   ?Weight: 215 lb 12.8 oz (97.9 kg)   ?Height: '6\' 1"'$  (1.854 m)   ? ?Physical exam: ?General: Vital signs reviewed.  Patient is well-developed and well-nourished, over weight male in no acute distress and cooperative with exam. ?Head: Normocephalic and atraumatic. ?Eyes: EOMI, conjunctivae normal, no scleral icterus. ?Neck: Supple, trachea midline, normal ROM, no JVD, masses, thyromegaly, or carotid bruit present. ?Cardiovascular: RRR, S1 normal, S2 normal, no murmurs, gallops, or rubs. ?Pulmonary/Chest: Clear to auscultation bilaterally, no wheezes, rales, or rhonchi. ?Abdominal: Soft, non-tender, non-distended, BS +, no masses, organomegaly, or guarding present. ?Musculoskeletal: No joint deformities, erythema, or stiffness, ROM full and nontender. ?Extremities: No lower extremity edema bilaterally,  pulses symmetric and intact bilaterally. No cyanosis or clubbing. ?Neurological: A&O x3, Strength is normal ?Skin: Warm, dry and intact. No rashes or erythema. ?Psychiatric: Normal mood and affect. speech and behavior is normal. Cognition and memory are normal. ?  ?ROS ?Comprehensive ROS Pertinent positive and negative noted in HPI   ?Last 3 Office BP  readings: ?BP Readings from Last 3 Encounters:  ?06/09/21 (!) 191/115  ?04/21/21 (!) 168/90  ?04/13/21 (!) 161/96  ? ? ?BMET ?   ?Component Value Date/Time  ? NA 138 04/13/2021 1531  ? NA 141 02/18/2021 1544  ? K 3.8 04/13/2021 1531  ? CL 103 04/13/2021 1531  ? CO2 28 04/13/2021 1531  ? GLUCOSE 108 (H) 04/13/2021 1531  ? BUN 15 04/13/2021 1531  ? BUN 12 02/18/2021 1544  ? CREATININE 1.55 (H) 04/13/2021 1531  ? CALCIUM 9.4 04/13/2021 1531  ? GFRNONAA 57 (L) 04/13/2021 1531  ? GFRAA 50 (L) 10/02/2019 1946  ? ? ?Renal function: ?CrCl cannot be calculated (Patient's most recent lab result is older than the maximum 21 days allowed.). ? ?  Clinical ASCVD: Yes  ?The 10-year ASCVD risk score (Arnett DK, et al., 2019) is: 20.5% ?  Values used to calculate the score: ?    Age: 21 years ?    Sex: Male ?    Is Non-Hispanic African American: Yes ?    Diabetic: No ?    Tobacco smoker: Yes ?    Systolic Blood Pressure: 943 mmHg ?    Is BP treated: Yes ?    HDL Cholesterol: 28 mg/dL ?    Total Cholesterol: 150 mg/dL ? ?ASCVD risk factors include- [place] ? ? ?ASSESSMENT & PLAN: ?Kutter was seen today for bp issues . ? ?Diagnoses and all orders for this visit: ? ?Screening for diabetes mellitus ?-     HgB A1c ? ?Essential hypertension ?-Counseled on lifestyle modifications for blood pressure control including reduced dietary sodium, increased exercise, weight reduction and adequate sleep. Also, educated patient about the risk for cardiovascular events, stroke and heart attack. Also counseled patient about the importance of medication adherence. If you participate in smoking, it is important to stop using tobacco as this will increase the risks associated with uncontrolled blood pressure. Referred to cardiology  ? ?Stage 3a chronic kidney disease ([place]) ?-     Ambulatory referral to Nephrology ? ?This note has been created with Surveyor, quantity. Any transcriptional errors are  unintentional.  ? ?[name], NP ?06/09/2021, 3:00 PM ?  ?

## 2021-06-10 ENCOUNTER — Telehealth: Payer: Self-pay | Admitting: Cardiology

## 2021-06-10 NOTE — Telephone Encounter (Signed)
? ?  Pt said his pcp told him to call because of his BP issue. He said that his pcp sent something to Dr. Bettina Gavia ?

## 2021-06-10 NOTE — Telephone Encounter (Signed)
Called patient to get more information. Patient had been having high blood pressures and his primary care physician referred him to see Dr. Bettina Gavia. An appointment was scheduled for 06/15/21 at 2:00 pm. Patient was very appreciative and had no further questions. ?

## 2021-06-15 ENCOUNTER — Ambulatory Visit (INDEPENDENT_AMBULATORY_CARE_PROVIDER_SITE_OTHER): Payer: 59 | Admitting: Cardiology

## 2021-06-15 ENCOUNTER — Encounter: Payer: Self-pay | Admitting: Cardiology

## 2021-06-15 VITALS — BP 124/84 | HR 70 | Ht 73.0 in | Wt 211.0 lb

## 2021-06-15 DIAGNOSIS — R079 Chest pain, unspecified: Secondary | ICD-10-CM

## 2021-06-15 DIAGNOSIS — I119 Hypertensive heart disease without heart failure: Secondary | ICD-10-CM

## 2021-06-15 DIAGNOSIS — N1831 Chronic kidney disease, stage 3a: Secondary | ICD-10-CM

## 2021-06-15 MED ORDER — HYDRALAZINE HCL 25 MG PO TABS: 25.0000 mg | ORAL_TABLET | Freq: Three times a day (TID) | ORAL | 3 refills | Status: DC

## 2021-06-15 MED ORDER — HYDRALAZINE HCL 25 MG PO TABS: 25.0000 mg | ORAL_TABLET | Freq: Three times a day (TID) | ORAL | 0 refills | Status: DC

## 2021-06-15 NOTE — Progress Notes (Signed)
?Cardiology Office Note:   ? ?Date:  06/15/2021  ? ?ID:  Russell Hernandez, DOB 15-Oct-1980, MRN [id number] ? ?PCP:  [name], NP  ?Cardiologist:  Shirlee More, MD   ? ?Referring MD: [name], NP  ? ? ?ASSESSMENT:   ? ?1. Chest pain, unspecified type   ?2. Hypertensive heart disease without heart failure   ?3. Stage 3a chronic kidney disease ([place])   ? ?PLAN:   ? ?In order of problems listed above: ? ?Fortunately has had a cardiac CTA has no aortopathy and has minimal coronary atherosclerosis.  His chest pain syndrome is characteristic of costochondral pain ongoing avoid prescription nonsteroidal with his kidney disease and take Aleve daily for 2 weeks.  He is reassured ?Blood pressures improved since he restarted hydralazine I will have him go back on his 3 times daily and continue his amlodipine carvedilol clonidine thiazide diuretic ?Stable CKD ? ? ?Next appointment: As needed ? ? ?Medication Adjustments/Labs and Tests Ordered: ?Current medicines are reviewed at length with the patient today.  Concerns regarding medicines are outlined above.  ?No orders of the defined types were placed in this encounter. ? ?Meds ordered this encounter  ?Medications  ? hydrALAZINE (APRESOLINE) 25 MG tablet  ?  Sig: Take 1 tablet (25 mg total) by mouth 3 (three) times daily.  ?  Dispense:  270 tablet  ?  Refill:  0  ? ? ?Chief Complaint  ?Patient presents with  ? Follow-up  ? Hypertension  ? Chest Pain  ? ? ?History of Present Illness:   ? ?Russell Hernandez is a 41 y.o. male with a hx of hypertension stage III 8 chronic kidney disease previous TIA last seen by me 02/18/2021 following multiple ED visits for chest pain with normal high-sensitivity troponins.  His echocardiogram 05/20/2019 showed EF 45 to 50% moderate concentric LVH.Marland Kitchen  He underwent cardiac CTA reported 03/19/2021 with a calcium score quite low at 5 and very minimal to mild CAD with small focal mixed calcified plaque in the second diagonal branch  0 to 24%.  He is worked into my office hours today with concerns of hypertension. ? ? ?Compliance with diet, lifestyle and medications: Yes ? ?He was very concerned about his heart went to the emergency room yesterday he is having point localized sharp pain left costochondral junction nonanginal in nature.  Recently blood pressure is elevated he started taking hydralazine again and since then his blood pressure has been normal.  He has typical findings of costochondral pain syndrome ongoing-take Aleve daily for 2 weeks.  I will give him a prescription to continue his hydralazine BP is normal today on a multidrug regimen ? ?He was seen in the ED [place] yesterday left without being seen by physician his EKG showed sinus rhythm left axis deviation poor R wave progression otherwise normal ?Past Medical History:  ?Diagnosis Date  ? AKI (acute kidney injury) ([place]) 05/20/2019  ? Depressed left ventricular ejection fraction 06/13/2019  ? Hypertension   ? Hypertensive urgency 05/19/2019  ? Hypokalemia 05/20/2019  ? Stage 3a chronic kidney disease ([address]) 06/13/2019  ? TIA (transient ischemic attack) 05/21/2019  ? ? ?Past Surgical History:  ?Procedure Laterality Date  ? No past surgery    ? ? ?Current Medications: ?Current Meds  ?Medication Sig  ? amLODipine (NORVASC) 10 MG tablet Take 1 tablet (10 mg total) by mouth daily.  ? aspirin 325 MG tablet Take 325 mg by mouth daily.  ?  atorvastatin (LIPITOR) 20 MG tablet Take 1 tablet (20 mg total) by mouth daily at 6 PM.  ? carvedilol (COREG) 25 MG tablet Take 1 tablet (25 mg total) by mouth 2 (two) times daily with a meal.  ? cloNIDine (CATAPRES) 0.3 MG tablet Take 1 tablet (0.3 mg total) by mouth 2 (two) times daily. Please schedule office visit for further refills  ? famotidine (PEPCID) 20 MG tablet Take 1 tablet (20 mg total) by mouth at bedtime.  ? hydrALAZINE (APRESOLINE) 25 MG tablet Take 1 tablet (25 mg total) by mouth 3 (three) times daily.  ?  hydrochlorothiazide (HYDRODIURIL) 25 MG tablet Take by mouth. Takes 3 tablets (75 mg) twice to three times daily  ? HYDROcodone-acetaminophen (NORCO/VICODIN) 5-325 MG tablet Take 1-2 tablets by mouth every 6 (six) hours as needed.  ? Iron, Ferrous Sulfate, 325 (65 Fe) MG TABS Take 325 mg by mouth daily. (Patient taking differently: Take 325 mg by mouth once a week.)  ? methocarbamol (ROBAXIN) 500 MG tablet Take 1 tablet (500 mg total) by mouth 2 (two) times daily as needed for muscle spasms.  ? omeprazole (PRILOSEC) 20 MG capsule Take 1 capsule (20 mg total) by mouth daily.  ? potassium chloride SA (KLOR-CON) 20 MEQ tablet Take 1 tablet (20 mEq total) by mouth daily.  ?  ? ?Allergies:   Lisinopril and Onion  ? ?Social History  ? ?Socioeconomic History  ? Marital status: Married  ?  Spouse name: Not on file  ? Number of children: 4  ? Years of education: Not on file  ? Highest education level: Not on file  ?Occupational History  ? Occupation: General labor  ?Tobacco Use  ? Smoking status: Every Day  ?  Packs/day: 0.50  ?  Types: Cigarettes  ? Smokeless tobacco: Never  ?Vaping Use  ? Vaping Use: Never used  ?Substance and Sexual Activity  ? Alcohol use: Yes  ?  Alcohol/week: 2.0 standard drinks  ?  Types: 2 Shots of liquor per week  ? Drug use: Yes  ?  Types: Marijuana  ? Sexual activity: Not on file  ?Other Topics Concern  ? Not on file  ?Social History Narrative  ? Not on file  ? ?Social Determinants of Health  ? ?Financial Resource Strain: Not on file  ?Food Insecurity: Not on file  ?Transportation Needs: Not on file  ?Physical Activity: Not on file  ?Stress: Not on file  ?Social Connections: Not on file  ?  ? ?Family History: ?The patient's family history includes Heart disease in his maternal grandmother; Hyperlipidemia in his mother; Hypertension in his maternal grandmother and mother. There is no history of Colon cancer, Rectal cancer, Stomach cancer, Esophageal cancer, or Pancreatic cancer. ?ROS:   ?Please see  the history of present illness.    ?All other systems reviewed and are negative. ? ?EKGs/Labs/Other Studies Reviewed:   ? ?The following studies were reviewed today: ? ? ? ?Recent Labs: ?02/18/2021: ALT 24 ?04/13/2021: BUN 15; Creatinine, Ser 1.55; Hemoglobin 15.5; Platelets 370; Potassium 3.8; Sodium 138  ?Recent Lipid Panel ?   ?Component Value Date/Time  ? CHOL 150 02/18/2021 1544  ? TRIG 351 (H) 02/18/2021 1544  ? HDL 28 (L) 02/18/2021 1544  ? CHOLHDL 5.4 (H) 02/18/2021 1544  ? CHOLHDL 9.6 05/20/2019 0627  ? VLDL 35 05/20/2019 0627  ? [place] 67 02/18/2021 1544  ? ? ?Physical Exam:   ? ?VS:  BP 124/84 (BP Location: Left Arm)   Pulse 70  Ht '6\' 1"'$  (1.854 m)   Wt 211 lb (95.7 kg)   SpO2 98%   BMI 27.84 kg/m?    ? ?Wt Readings from Last 3 Encounters:  ?06/15/21 211 lb (95.7 kg)  ?06/09/21 215 lb 12.8 oz (97.9 kg)  ?04/21/21 219 lb 6 oz (99.5 kg)  ?  ? ?GEN:  Well nourished, well developed in no acute distress ?HEENT: Normal ?NECK: No JVD; No carotid bruits ?LYMPHATICS: No lymphadenopathy ?CARDIAC: Tender over the left costochondral junction RRR, no murmurs, rubs, gallops ?RESPIRATORY:  Clear to auscultation without rales, wheezing or rhonchi  ?ABDOMEN: Soft, non-tender, non-distended ?MUSCULOSKELETAL:  No edema; No deformity  ?SKIN: Warm and dry ?NEUROLOGIC:  Alert and oriented x 3 ?PSYCHIATRIC:  Normal affect  ? ? ?Signed, ?Shirlee More, MD  ?06/15/2021 2:12 PM    ?[place]  ?

## 2021-06-15 NOTE — Patient Instructions (Addendum)
Medication Instructions:  ?Your physician has recommended you make the following change in your medication:  ?Continue Hydralazine 25 mg three times daily ?Take Aleve once daily X 2 weeks ? ?*If you need a refill on your cardiac medications before your next appointment, please call your pharmacy* ? ? ?Lab Work: ?NONE ?If you have labs (blood work) drawn today and your tests are completely normal, you will receive your results only by: ?MyChart Message (if you have MyChart) OR ?A paper copy in the mail ?If you have any lab test that is abnormal or we need to change your treatment, we will call you to review the results. ? ? ?Testing/Procedures: ?NONE ? ? ?Follow-Up: ?At [facility], you and your health needs are our priority.  As part of our continuing mission to provide you with exceptional heart care, we have created designated Provider Care Teams.  These Care Teams include your primary Cardiologist (physician) and Advanced Practice Providers (APPs -  Physician Assistants and Nurse Practitioners) who all work together to provide you with the care you need, when you need it. ? ?We recommend signing up for the patient portal called "MyChart".  Sign up information is provided on this After Visit Summary.  MyChart is used to connect with patients for Virtual Visits (Telemedicine).  Patients are able to view lab/test results, encounter notes, upcoming appointments, etc.  Non-urgent messages can be sent to your provider as well.   ?To learn more about what you can do with MyChart, go to [email or web address].   ? ?Your next appointment:  As needed ?  ? ?The format for your next appointment:   ?In Person ? ?Provider:   ?Shirlee More, MD  ? ? ?Other Instructions ?  ?

## 2021-06-16 ENCOUNTER — Emergency Department (HOSPITAL_BASED_OUTPATIENT_CLINIC_OR_DEPARTMENT_OTHER)
Admission: EM | Admit: 2021-06-16 | Discharge: 2021-06-16 | Disposition: A | Discharge status: Home / Self Care | Payer: Self-pay | Attending: Emergency Medicine | Admitting: Emergency Medicine

## 2021-06-16 ENCOUNTER — Encounter (HOSPITAL_BASED_OUTPATIENT_CLINIC_OR_DEPARTMENT_OTHER): Payer: Self-pay | Admitting: Emergency Medicine

## 2021-06-16 ENCOUNTER — Other Ambulatory Visit: Payer: Self-pay

## 2021-06-16 ENCOUNTER — Telehealth: Payer: Self-pay | Admitting: Cardiology

## 2021-06-16 ENCOUNTER — Emergency Department (HOSPITAL_BASED_OUTPATIENT_CLINIC_OR_DEPARTMENT_OTHER): Payer: Self-pay

## 2021-06-16 DIAGNOSIS — N183 Chronic kidney disease, stage 3 unspecified: Secondary | ICD-10-CM | POA: Insufficient documentation

## 2021-06-16 DIAGNOSIS — Z7982 Long term (current) use of aspirin: Secondary | ICD-10-CM | POA: Insufficient documentation

## 2021-06-16 DIAGNOSIS — R079 Chest pain, unspecified: Secondary | ICD-10-CM

## 2021-06-16 DIAGNOSIS — I129 Hypertensive chronic kidney disease with stage 1 through stage 4 chronic kidney disease, or unspecified chronic kidney disease: Secondary | ICD-10-CM | POA: Insufficient documentation

## 2021-06-16 DIAGNOSIS — F419 Anxiety disorder, unspecified: Secondary | ICD-10-CM | POA: Insufficient documentation

## 2021-06-16 DIAGNOSIS — R0789 Other chest pain: Secondary | ICD-10-CM | POA: Insufficient documentation

## 2021-06-16 DIAGNOSIS — Z79899 Other long term (current) drug therapy: Secondary | ICD-10-CM | POA: Insufficient documentation

## 2021-06-16 LAB — BASIC METABOLIC PANEL
Anion gap: 9 (ref 5–15)
BUN: 19 mg/dL (ref 6–20)
CO2: 22 mmol/L (ref 22–32)
Calcium: 9.2 mg/dL (ref 8.9–10.3)
Chloride: 106 mmol/L (ref 98–111)
Creatinine, Ser: 1.8 mg/dL — ABNORMAL HIGH (ref 0.61–1.24)
GFR, Estimated: 48 mL/min — ABNORMAL LOW (ref >60)
Glucose, Bld: 127 mg/dL — ABNORMAL HIGH (ref 70–99)
Potassium: 3.8 mmol/L (ref 3.5–5.1)
Sodium: 137 mmol/L (ref 135–145)

## 2021-06-16 LAB — CBC
HCT: 44.2 % (ref 39.0–52.0)
Hemoglobin: 14.8 g/dL (ref 13.0–17.0)
MCH: 29.2 pg (ref 26.0–34.0)
MCHC: 33.5 g/dL (ref 30.0–36.0)
MCV: 87.4 fL (ref 80.0–100.0)
Platelets: 362 10*3/uL (ref 150–400)
RBC: 5.06 MIL/uL (ref 4.22–5.81)
RDW: 15 % (ref 11.5–15.5)
WBC: 7.9 10*3/uL (ref 4.0–10.5)
nRBC: 0 % (ref 0.0–0.2)

## 2021-06-16 LAB — TROPONIN I (HIGH SENSITIVITY)
Troponin I (High Sensitivity): 6 ng/L (ref <18)
Troponin I (High Sensitivity): 6 ng/L (ref <18)

## 2021-06-16 MED ORDER — HYDROXYZINE HCL 25 MG PO TABS: 25.0000 mg | ORAL_TABLET | Freq: Four times a day (QID) | ORAL | 0 refills | Status: DC

## 2021-06-16 NOTE — ED Triage Notes (Signed)
Reports epigastric pain and SOB since starting hydralazine a week ago.  Seen by ems at home and was told it was a panic attack. ?

## 2021-06-16 NOTE — Discharge Instructions (Signed)
Your EKG and labs look reassuring today.  Your kidney function is slightly decreased however this may be due to dehydration.   ? ?Hydroxyzine is the medication that is at the pharmacy for your anxiety.  You may take a full pill at night to sleep however take half during the day if you have a panic attack.  Speak about your anxiety with your primary care provider and your appointment next week.  You should also speak about your kidney function and have repeat labs. ? ?It was a pleasure to meet you and I hope that you feel better! ?

## 2021-06-16 NOTE — ED Provider Notes (Signed)
?Russell Hernandez ?Provider Note ? ? ?CSN: [id number] ?Arrival date & time: 06/16/21  0946 ? ?  ? ?History ? ?Chief Complaint  ?Patient presents with  ? Abdominal Pain  ? Shortness of Breath  ? ? ?Russell Hernandez is a 41 y.o. male with a past medical history of hypertension, chronic kidney disease stage III, hyperlipidemia and TIA presenting today with the concern for chest pain.  Reports that he follows with cardiology however over the last few days he has experienced some chest pain.  He says that this also feels like previous anxiety attacks and he is unsure why he has not yet been prescribed anything for his anxiety.  He reports a history of uncontrolled hypertension which makes him nervous anytime he feels any type of discomfort in his chest.  He said that he first felt it yesterday and went to [facility] however the wait was too long so he went home.  He then called EMS later in the evening due to another episode of midline chest pain however they reassured him that everything was okay and he went to bed.  This morning he again had midline chest pain and checked his blood pressure which was high so he came to the emergency [facility].  No associated palpitations or shortness of breath.  Says the pain does not radiate anywhere.  Is currently asymptomatic. ? ? ?Abdominal Pain ?Associated symptoms: chest pain   ?Associated symptoms: no cough and no shortness of breath   ?Shortness of Breath ?Associated symptoms: chest pain   ?Associated symptoms: no abdominal pain and no cough   ? ?  ? ?Home Medications ?Prior to Admission medications   ?Medication Sig Start Date End Date Taking? Authorizing Provider  ?amLODipine (NORVASC) 10 MG tablet Take 1 tablet (10 mg total) by mouth daily. 10/12/20 06/15/21  Tobb, Godfrey Pick, DO  ?aspirin 325 MG tablet Take 325 mg by mouth daily.    [provider]  ?atorvastatin (LIPITOR) 20 MG tablet Take 1 tablet (20 mg total) by mouth daily at 6 PM.  10/12/20   Tobb, Kardie, DO  ?carvedilol (COREG) 25 MG tablet Take 1 tablet (25 mg total) by mouth 2 (two) times daily with a meal. 10/12/20   Tobb, Kardie, DO  ?cloNIDine (CATAPRES) 0.3 MG tablet Take 1 tablet (0.3 mg total) by mouth 2 (two) times daily. Please schedule office visit for further refills 10/12/20   Tobb, Kardie, DO  ?famotidine (PEPCID) 20 MG tablet Take 1 tablet (20 mg total) by mouth at bedtime. 04/21/21 07/20/21  [name], MD  ?hydrALAZINE (APRESOLINE) 25 MG tablet Take 1 tablet (25 mg total) by mouth 3 (three) times daily. 06/15/21 09/13/21  Richardo Priest, MD  ?hydrochlorothiazide (HYDRODIURIL) 25 MG tablet Take by mouth. Takes 3 tablets (75 mg) twice to three times daily    [provider]  ?HYDROcodone-acetaminophen (NORCO/VICODIN) 5-325 MG tablet Take 1-2 tablets by mouth every 6 (six) hours as needed. 04/13/21   [name], [name], PA-C  ?Iron, Ferrous Sulfate, 325 (65 Fe) MG TABS Take 325 mg by mouth daily. ?Patient taking differently: Take 325 mg by mouth once a week. 12/24/19   [name], MD  ?methocarbamol (ROBAXIN) 500 MG tablet Take 1 tablet (500 mg total) by mouth 2 (two) times daily as needed for muscle spasms. 04/13/21   [name], Christian H, PA-C  ?omeprazole (PRILOSEC) 20 MG capsule Take 1 capsule (20 mg total) by mouth daily. 04/21/21 07/20/21  [name], MD  ?  potassium chloride SA (KLOR-CON) 20 MEQ tablet Take 1 tablet (20 mEq total) by mouth daily. 09/07/20   [name], NP  ?lisinopril (PRINIVIL,ZESTRIL) 10 MG tablet Take 1 tablet (10 mg total) by mouth daily. 12/10/16 01/17/20  [name], MD  ?ranitidine (ZANTAC) 150 MG tablet Take 1 tablet (150 mg total) by mouth 2 (two) times daily. 12/13/16 02/05/19  [name], [name], MD  ?   ? ?Allergies    ?Lisinopril and Onion   ? ?Review of Systems   ?Review of Systems  ?Respiratory:  Positive for chest tightness. Negative for cough and shortness of breath.   ?Cardiovascular:  Positive for chest pain.   ?Gastrointestinal:  Negative for abdominal pain.  ? ?Physical Exam ?Updated Vital Signs ?BP 135/86   Pulse 60   Temp 98.1 ?F (36.7 ?C) (Oral)   Resp 14   Ht '6\' 1"'$  (1.854 m)   Wt 97.5 kg   SpO2 100%   BMI 28.37 kg/m?  ?Physical Exam ?Vitals and nursing note reviewed.  ?Constitutional:   ?   Appearance: Normal appearance.  ?HENT:  ?   Head: Normocephalic and atraumatic.  ?Eyes:  ?   General: No scleral icterus. ?   Conjunctiva/sclera: Conjunctivae normal.  ?Pulmonary:  ?   Effort: Pulmonary effort is normal. No respiratory distress.  ?Skin: ?   Findings: No rash.  ?Neurological:  ?   Mental Status: He is alert.  ?Psychiatric:     ?   Mood and Affect: Mood normal.  ? ? ?ED Results / Procedures / Treatments   ?Labs ?(all labs ordered are listed, but only abnormal results are displayed) ?Labs Reviewed  ?BASIC METABOLIC PANEL - Abnormal; Notable for the following components:  ?    Result Value  ? Glucose, Bld 127 (*)   ? Creatinine, Ser 1.80 (*)   ? GFR, Estimated 48 (*)   ? All other components within normal limits  ?CBC  ?TROPONIN I (HIGH SENSITIVITY)  ?TROPONIN I (HIGH SENSITIVITY)  ? ? ?EKG ?EKG Interpretation ? ?Date/Time:  Wednesday June 16 2021 09:59:31 EDT ?Ventricular Rate:  61 ?PR Interval:  168 ?QRS Duration: 92 ?QT Interval:  432 ?QTC Calculation: 434 ?R Axis:   -31 ?Text Interpretation: Normal sinus rhythm Left axis deviation Abnormal ECG When compared with ECG of 13-Apr-2021 15:23, No significant change since last tracing Confirmed by Gareth Morgan [phone]) on 06/16/2021 10:46:32 AM ? ?Radiology ?DG Chest 2 View ? ?Result Date: 06/16/2021 ?CLINICAL DATA:  Chest pain, shortness of breath. EXAM: CHEST - 2 VIEW COMPARISON:  06/14/2021 and CT chest 03/19/2021. FINDINGS: Trachea is midline. Heart size normal. Lungs are clear. No pleural fluid. IMPRESSION: No acute findings. Electronically Signed   By: Lorin Picket M.D.   On: 06/16/2021 10:29   ? ?Procedures ?Procedures  ? ?Medications Ordered in  ED ?Medications - No data to display ? ?ED Course/ Medical Decision Making/ A&P ?  ?                        ?Medical Decision Making ?Amount and/or Complexity of Data Reviewed ?Labs: ordered. ?Radiology: ordered. ? ?Risk ?Prescription drug management. ? ? ?Patient presents to the ED for concern of chest pain. The emergent differential diagnosis of chest pain includes: Acute coronary syndrome, pericarditis, aortic dissection, pulmonary embolism, tension pneumothorax, and esophageal rupture. ? ?Patient comorbidities: Hypertension, chronic kidney disease and anxiety ? ?Per internal/external chart review: Patient has had around 10 visits for chest pain since 01/2021.  All of these were with negative work-up.  I am also able to see his note with his cardiologist yesterday.  Dr. Bettina Gavia believes his pain is consistent with costochondral and he has been instructed to take Aleve each day.  He will also go back to taking hydralazine 3 times a day due to his blood pressure. ? ? ?I performed a full physical exam, pertinent findings include: ?No pertinent findings ? ?Diagnostics: ? ?I ordered and viewed labs. The pertinent results include:  ?Negative troponin x2 ?Creatinine 1.8, which appears to be around his baseline. ? ?I ordered and individually viewed patient's chest x-ray.  This was negative ? ?Cardiac Monitoring: ? ?The patient was maintained on a cardiac monitor.  I personally viewed and interpreted the cardiac monitored which showed normal sinus rhythm with a normal rate on the monitor, no premature complexes ? ?Treatment: ? ?Patient is asymptomatic at this time, no medications ordered ? ? ?MDM/Disposition: ? ? ?I reevaluated the patient and found that they have remained asymptomatic.  He is reassured by negative work-up.  I suspect his symptoms to be consistent with anxiety attacks surrounding his health.  He does have chronic medical conditions however even on his own admission states that he becomes paranoid anytime  he feels an odd twinge or fluttering in his chest.  He asked what he can do for his anxiety and I told him I would not be able to manage this long-term and he needs to speak with a primary care or psychiatrist about this however I a

## 2021-06-17 ENCOUNTER — Encounter: Payer: 59 | Admitting: Gastroenterology

## 2021-06-17 ENCOUNTER — Telehealth: Payer: Self-pay | Admitting: Gastroenterology

## 2021-06-17 NOTE — Telephone Encounter (Signed)
Patient's wife called to cancel patient's procedure for today because he was out of town and on his way back home and his vehicle broke down and was unable to make it back by his procedure time.  The appointment has been rescheduled for 4/12 at 10 a.m. ? ?Thank you. ?

## 2021-06-22 ENCOUNTER — Telehealth: Payer: Self-pay | Admitting: Gastroenterology

## 2021-06-22 ENCOUNTER — Encounter: Payer: Self-pay | Admitting: Certified Registered Nurse Anesthetist

## 2021-06-22 NOTE — Telephone Encounter (Signed)
Good Afternoon Dr. Alda Ponder, ? ? ?Patient wife called and stated that patient needed to reschedule his EGD for tomorrow in May due to insurance reasons. ? ?Patient was rescheduled for 5/16 at 10:00.  ?

## 2021-06-23 ENCOUNTER — Encounter: Payer: Self-pay | Admitting: Gastroenterology

## 2021-06-24 ENCOUNTER — Ambulatory Visit: Payer: 59 | Admitting: Family Medicine

## 2021-07-12 ENCOUNTER — Other Ambulatory Visit: Payer: Self-pay | Admitting: Cardiology

## 2021-07-12 DIAGNOSIS — I10 Essential (primary) hypertension: Secondary | ICD-10-CM

## 2021-07-26 ENCOUNTER — Telehealth: Payer: Self-pay | Admitting: Gastroenterology

## 2021-07-26 NOTE — Telephone Encounter (Signed)
Patient's wife called to cancel endoscopy for tomorrow as patient's insurance has not yet kicked in.  The procedure has been rescheduled to 6/8 at 10 a.m. ?

## 2021-07-27 ENCOUNTER — Encounter: Payer: Self-pay | Admitting: Gastroenterology

## 2021-08-04 ENCOUNTER — Other Ambulatory Visit: Payer: Self-pay

## 2021-08-11 NOTE — Progress Notes (Unsigned)
Cardiology Office Note:    Date:  08/12/2021   ID:  Russell Hernandez, DOB [birth year], MRN [id number]  PCP:  [name], NP  Cardiologist:  Shirlee More, MD    Referring MD: [name], NP    ASSESSMENT:    1. Hypertensive heart disease without heart failure   2. Essential hypertension   3. Mixed hyperlipidemia    PLAN:    In order of problems listed above:  He is doing well his blood pressure is nicely controlled and reinforced with him he does not have heart failure he is committed to caring for himself and compliant with his medications we will continue his multidrug regimen for hypertension including sent to active clonidine beta-blocker hydralazine thiazide diuretic. Continue with statin He has very minimal coronary atherosclerosis he is reassured   Next appointment: 1 year   Medication Adjustments/Labs and Tests Ordered: Current medicines are reviewed at length with the patient today.  Concerns regarding medicines are outlined above.  No orders of the defined types were placed in this encounter.  Meds ordered this encounter  Medications   cloNIDine (CATAPRES) 0.3 MG tablet    Sig: Take 1 tablet (0.3 mg total) by mouth 2 (two) times daily.    Dispense:  180 tablet    Refill:  3   carvedilol (COREG) 25 MG tablet    Sig: Take 1 tablet (25 mg total) by mouth 2 (two) times daily with a meal.    Dispense:  180 tablet    Refill:  3   amLODipine (NORVASC) 10 MG tablet    Sig: Take 1 tablet (10 mg total) by mouth daily.    Dispense:  90 tablet    Refill:  3    Chief complaint I was told to see you after the emergency room   History of Present Illness:    Russell Hernandez is a 41 y.o. male with a hx of hypertension stage III CKD previous TIA and recurrent chest pain.  His echocardiogram showed EF 45 to 50% moderate concentric LVH.  Cardiac CTA 03/19/2021 showed calcium score 5 with very minimal to mild coronary stenosis in the left anterior  descending coronary artery as well as second diagonal branch 0 to 24%.  He was  last seen 06/15/2021.  Compliance with diet, lifestyle and medications: Yes  He is doing much better he was very cardiac apprehensive acknowledges that any symptoms will cause him to go the emergency room and since he is come to peace with the fact that his blood pressure is well controlled and his cardiac CTA was very mildly abnormal he has had no further chest discomfort he attributes much of it to stress and anxiety Past Medical History:  Diagnosis Date   AKI (acute kidney injury) ([place]) 05/20/2019   Chest pain 09/17/2020   Depressed left ventricular ejection fraction 06/13/2019   Hypertension    Hypertensive heart disease 05/19/2019   Hypertensive urgency 05/19/2019   Hypokalemia 05/20/2019   Mixed hyperlipidemia 10/23/2019   Stage 3a chronic kidney disease ([place]) 06/13/2019   TIA (transient ischemic attack) 05/21/2019   Tobacco use 01/17/2020    Past Surgical History:  Procedure Laterality Date   No past surgery      Current Medications: Current Meds  Medication Sig   aspirin 325 MG tablet Take 325 mg by mouth daily.   atorvastatin (LIPITOR) 20 MG tablet Take 1 tablet (20 mg total) by mouth daily at 6 PM.   famotidine (PEPCID)  20 MG tablet Take 20 mg by mouth at bedtime.   hydrALAZINE (APRESOLINE) 25 MG tablet Take 1 tablet (25 mg total) by mouth 3 (three) times daily.   hydrochlorothiazide (HYDRODIURIL) 25 MG tablet Take 75 mg by mouth 3 (three) times daily as needed for edema or high blood pressure.   HYDROcodone-acetaminophen (NORCO/VICODIN) 5-325 MG tablet Take 1-2 tablets by mouth every 6 (six) hours as needed.   hydrOXYzine (ATARAX) 25 MG tablet Take 1 tablet (25 mg total) by mouth every 6 (six) hours.   Iron, Ferrous Sulfate, 325 (65 Fe) MG TABS Take 325 mg by mouth daily.   methocarbamol (ROBAXIN) 500 MG tablet Take 1 tablet (500 mg total) by mouth 2 (two) times daily as needed for muscle spasms.    omeprazole (PRILOSEC) 20 MG capsule Take 20 mg by mouth daily.   potassium chloride SA (KLOR-CON) 20 MEQ tablet Take 1 tablet (20 mEq total) by mouth daily.   [DISCONTINUED] amLODipine (NORVASC) 10 MG tablet Take 10 mg by mouth daily.   [DISCONTINUED] carvedilol (COREG) 25 MG tablet Take 1 tablet (25 mg total) by mouth 2 (two) times daily with a meal.   [DISCONTINUED] cloNIDine (CATAPRES) 0.3 MG tablet Take 1 tablet (0.3 mg total) by mouth 2 (two) times daily.     Allergies:   Lisinopril and Onion   Social History   Socioeconomic History   Marital status: Married    Spouse name: Not on file   Number of children: 4   Years of education: Not on file   Highest education level: Not on file  Occupational History   Occupation: General labor  Tobacco Use   Smoking status: Every Day    Packs/day: 0.50    Types: Cigarettes   Smokeless tobacco: Never  Vaping Use   Vaping Use: Never used  Substance and Sexual Activity   Alcohol use: Yes    Alcohol/week: 2.0 standard drinks    Types: 2 Shots of liquor per week   Drug use: Yes    Types: Marijuana   Sexual activity: Not on file  Other Topics Concern   Not on file  Social History Narrative   Not on file   Social Determinants of Health   Financial Resource Strain: Not on file  Food Insecurity: Not on file  Transportation Needs: Not on file  Physical Activity: Not on file  Stress: Not on file  Social Connections: Not on file     Family History: The patient's family history includes Heart disease in his maternal grandmother; Hyperlipidemia in his mother; Hypertension in his maternal grandmother and mother. There is no history of Colon cancer, Rectal cancer, Stomach cancer, Esophageal cancer, or Pancreatic cancer. ROS:   Please see the history of present illness.    All other systems reviewed and are negative.  EKGs/Labs/Other Studies Reviewed:    The following studies were reviewed today:    Recent Labs: 02/18/2021: ALT  24 06/16/2021: BUN 19; Creatinine, Ser 1.80; Hemoglobin 14.8; Platelets 362; Potassium 3.8; Sodium 137  Recent Lipid Panel    Component Value Date/Time   CHOL 150 02/18/2021 1544   TRIG 351 (H) 02/18/2021 1544   HDL 28 (L) 02/18/2021 1544   CHOLHDL 5.4 (H) 02/18/2021 1544   CHOLHDL 9.6 05/20/2019 0627   VLDL 35 05/20/2019 0627   LDLCALC 67 02/18/2021 1544    Physical Exam:    VS:  BP 136/80   Pulse 66   Ht '6\' 1"'$  (1.854 m)   Wt 216  lb 1.9 oz (98 kg)   SpO2 99%   BMI 28.51 kg/m     Wt Readings from Last 3 Encounters:  08/12/21 216 lb 1.9 oz (98 kg)  06/16/21 215 lb (97.5 kg)  06/15/21 211 lb (95.7 kg)     GEN:  Well nourished, well developed in no acute distress HEENT: Normal NECK: No JVD; No carotid bruits LYMPHATICS: No lymphadenopathy CARDIAC: RRR, no murmurs, rubs, gallops RESPIRATORY:  Clear to auscultation without rales, wheezing or rhonchi  ABDOMEN: Soft, non-tender, non-distended MUSCULOSKELETAL:  No edema; No deformity  SKIN: Warm and dry NEUROLOGIC:  Alert and oriented x 3 PSYCHIATRIC:  Normal affect    Signed, Shirlee More, MD  08/12/2021 12:01 PM    [facility]

## 2021-08-12 ENCOUNTER — Ambulatory Visit (INDEPENDENT_AMBULATORY_CARE_PROVIDER_SITE_OTHER): Payer: Commercial Managed Care - HMO | Admitting: Cardiology

## 2021-08-12 ENCOUNTER — Encounter: Payer: Self-pay | Admitting: Cardiology

## 2021-08-12 VITALS — BP 136/80 | HR 66 | Ht 73.0 in | Wt 216.1 lb

## 2021-08-12 DIAGNOSIS — E782 Mixed hyperlipidemia: Secondary | ICD-10-CM

## 2021-08-12 DIAGNOSIS — I10 Essential (primary) hypertension: Secondary | ICD-10-CM

## 2021-08-12 DIAGNOSIS — I119 Hypertensive heart disease without heart failure: Secondary | ICD-10-CM

## 2021-08-12 MED ORDER — CARVEDILOL 25 MG PO TABS: 25.0000 mg | ORAL_TABLET | Freq: Two times a day (BID) | ORAL | 3 refills | Status: DC

## 2021-08-12 MED ORDER — AMLODIPINE BESYLATE 10 MG PO TABS: 10.0000 mg | ORAL_TABLET | Freq: Every day | ORAL | 3 refills | Status: DC

## 2021-08-12 MED ORDER — CLONIDINE HCL 0.3 MG PO TABS: 0.3000 mg | ORAL_TABLET | Freq: Two times a day (BID) | ORAL | 3 refills | Status: DC

## 2021-08-12 NOTE — Patient Instructions (Signed)

## 2021-08-17 ENCOUNTER — Telehealth: Payer: Self-pay | Admitting: Gastroenterology

## 2021-08-17 NOTE — Telephone Encounter (Signed)
Good Afternoon Dr. 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  Patient was rescheduled for 7/17 at 10:00.

## 2021-08-19 ENCOUNTER — Encounter: Payer: Self-pay | Admitting: Gastroenterology

## 2021-09-21 ENCOUNTER — Encounter: Payer: Self-pay | Admitting: Certified Registered Nurse Anesthetist

## 2021-09-27 ENCOUNTER — Encounter: Payer: Self-pay | Admitting: Gastroenterology

## 2021-09-27 ENCOUNTER — Telehealth: Payer: Self-pay | Admitting: Gastroenterology

## 2021-09-27 NOTE — Telephone Encounter (Signed)
Called patient at 9:12 to see if he was coming in for his EGD. Patient has cancelled 5 times this year so for. Left message on cell phone to have patient call me back directly to let us know if he is coming in or needs to R/S.

## 2021-09-27 NOTE — Telephone Encounter (Signed)
Pt cancelled 5 times. Pl discharge from [facility] practice. RG

## 2021-09-27 NOTE — Telephone Encounter (Signed)
Dismissal letter and form placed in your office.  Please give to [place] or Remo Lipps once signed and they will need to forward on to [employer].

## 2021-09-27 NOTE — Progress Notes (Signed)
[facility] dismissal initiated

## 2021-09-28 NOTE — Telephone Encounter (Signed)
Given to [place]!

## 2021-10-07 ENCOUNTER — Telehealth: Payer: Self-pay | Admitting: Gastroenterology

## 2021-10-07 NOTE — Telephone Encounter (Signed)
Patient dismissed from [facility] by [name], MD, effective 09/27/21. Dismissal Letter sent out by 1st class mail. [employer]

## 2021-11-04 ENCOUNTER — Other Ambulatory Visit (HOSPITAL_COMMUNITY): Payer: Self-pay

## 2021-11-08 ENCOUNTER — Ambulatory Visit: Payer: Commercial Managed Care - HMO | Admitting: Gastroenterology

## 2021-12-10 DIAGNOSIS — N189 Chronic kidney disease, unspecified: Secondary | ICD-10-CM | POA: Insufficient documentation

## 2021-12-10 DIAGNOSIS — R12 Heartburn: Secondary | ICD-10-CM | POA: Insufficient documentation

## 2021-12-10 DIAGNOSIS — R1013 Epigastric pain: Secondary | ICD-10-CM | POA: Insufficient documentation

## 2021-12-10 HISTORY — DX: Heartburn: R12

## 2021-12-10 HISTORY — DX: Chronic kidney disease, unspecified: N18.9

## 2021-12-10 HISTORY — DX: Epigastric pain: R10.13

## 2022-02-13 ENCOUNTER — Encounter (HOSPITAL_BASED_OUTPATIENT_CLINIC_OR_DEPARTMENT_OTHER): Payer: Self-pay | Admitting: *Deleted

## 2022-02-13 ENCOUNTER — Other Ambulatory Visit: Payer: Self-pay

## 2022-02-13 ENCOUNTER — Emergency Department (HOSPITAL_BASED_OUTPATIENT_CLINIC_OR_DEPARTMENT_OTHER)
Admission: EM | Admit: 2022-02-13 | Discharge: 2022-02-14 | Disposition: A | Discharge status: Home / Self Care | Payer: Commercial Managed Care - HMO | Attending: Emergency Medicine | Admitting: Emergency Medicine

## 2022-02-13 DIAGNOSIS — Z7982 Long term (current) use of aspirin: Secondary | ICD-10-CM | POA: Diagnosis not present

## 2022-02-13 DIAGNOSIS — U071 COVID-19: Secondary | ICD-10-CM | POA: Diagnosis not present

## 2022-02-13 DIAGNOSIS — R059 Cough, unspecified: Secondary | ICD-10-CM | POA: Diagnosis present

## 2022-02-13 LAB — URINALYSIS, ROUTINE W REFLEX MICROSCOPIC
Bilirubin Urine: NEGATIVE
Glucose, UA: NEGATIVE mg/dL
Hgb urine dipstick: NEGATIVE
Ketones, ur: NEGATIVE mg/dL
Leukocytes,Ua: NEGATIVE
Nitrite: NEGATIVE
Protein, ur: >=300 mg/dL — AB
Specific Gravity, Urine: >=1.03 (ref 1.005–1.030)
pH: 5.5 (ref 5.0–8.0)

## 2022-02-13 LAB — URINALYSIS, MICROSCOPIC (REFLEX)

## 2022-02-13 LAB — COMPREHENSIVE METABOLIC PANEL
ALT: 13 U/L (ref 0–44)
AST: 16 U/L (ref 15–41)
Albumin: 3.7 g/dL (ref 3.5–5.0)
Alkaline Phosphatase: 60 U/L (ref 38–126)
Anion gap: 9 (ref 5–15)
BUN: 17 mg/dL (ref 6–20)
CO2: 24 mmol/L (ref 22–32)
Calcium: 9 mg/dL (ref 8.9–10.3)
Chloride: 104 mmol/L (ref 98–111)
Creatinine, Ser: 1.61 mg/dL — ABNORMAL HIGH (ref 0.61–1.24)
GFR, Estimated: 54 mL/min — ABNORMAL LOW (ref >60)
Glucose, Bld: 115 mg/dL — ABNORMAL HIGH (ref 70–99)
Potassium: 4.2 mmol/L (ref 3.5–5.1)
Sodium: 137 mmol/L (ref 135–145)
Total Bilirubin: 0.6 mg/dL (ref 0.3–1.2)
Total Protein: 9.1 g/dL — ABNORMAL HIGH (ref 6.5–8.1)

## 2022-02-13 LAB — RESP PANEL BY RT-PCR (FLU A&B, COVID) ARPGX2
Influenza A by PCR: NEGATIVE
Influenza B by PCR: NEGATIVE
SARS Coronavirus 2 by RT PCR: POSITIVE — AB

## 2022-02-13 LAB — CBC
HCT: 40.7 % (ref 39.0–52.0)
Hemoglobin: 13.3 g/dL (ref 13.0–17.0)
MCH: 27.8 pg (ref 26.0–34.0)
MCHC: 32.7 g/dL (ref 30.0–36.0)
MCV: 85 fL (ref 80.0–100.0)
Platelets: 448 10*3/uL — ABNORMAL HIGH (ref 150–400)
RBC: 4.79 MIL/uL (ref 4.22–5.81)
RDW: 14.7 % (ref 11.5–15.5)
WBC: 9.5 10*3/uL (ref 4.0–10.5)
nRBC: 0 % (ref 0.0–0.2)

## 2022-02-13 LAB — LIPASE, BLOOD: Lipase: 31 U/L (ref 11–51)

## 2022-02-13 NOTE — ED Provider Notes (Signed)
[facility] [place] [facility] Provider Note   CSN: [id number] Arrival date & time: 02/13/22  1909     History  Chief Complaint  Patient presents with   Illness    Russell Hernandez is a 41 y.o. male.  The history is provided by the patient.  Illness Location:  At home Quality:  Cough congestion and fatigue Severity:  Moderate Onset quality:  Gradual Duration:  1 week Timing:  Constant Progression:  Improving Chronicity:  New Context:  Wife was sick first Relieved by:  Time Worsened by:  Nothing Associated symptoms: congestion, cough and rhinorrhea   Associated symptoms: no abdominal pain, no fever, no shortness of breath, no vomiting and no wheezing        Home Medications Prior to Admission medications   Medication Sig Start Date End Date Taking? Authorizing Provider  amLODipine (NORVASC) 10 MG tablet Take 1 tablet (10 mg total) by mouth daily. 08/12/21   Richardo Priest, MD  aspirin 325 MG tablet Take 325 mg by mouth daily.    [provider]  atorvastatin (LIPITOR) 20 MG tablet Take 1 tablet (20 mg total) by mouth daily at 6 PM. 10/12/20   Tobb, Kardie, DO  carvedilol (COREG) 25 MG tablet Take 1 tablet (25 mg total) by mouth 2 (two) times daily with a meal. 08/12/21   Munley, Hilton Cork, MD  cloNIDine (CATAPRES) 0.3 MG tablet Take 1 tablet (0.3 mg total) by mouth 2 (two) times daily. 08/12/21   Richardo Priest, MD  famotidine (PEPCID) 20 MG tablet Take 20 mg by mouth at bedtime.    [provider]  hydrALAZINE (APRESOLINE) 25 MG tablet Take 1 tablet (25 mg total) by mouth 3 (three) times daily. 06/15/21 09/13/21  Richardo Priest, MD  hydrochlorothiazide (HYDRODIURIL) 25 MG tablet Take 75 mg by mouth 3 (three) times daily as needed for edema or high blood pressure.    [provider]  HYDROcodone-acetaminophen (NORCO/VICODIN) 5-325 MG tablet Take 1-2 tablets by mouth every 6 (six) hours as needed. 04/13/21   [name], Christian H, PA-C   hydrOXYzine (ATARAX) 25 MG tablet Take 1 tablet (25 mg total) by mouth every 6 (six) hours. 06/16/21   Redwine, Madison A, PA-C  Iron, Ferrous Sulfate, 325 (65 Fe) MG TABS Take 325 mg by mouth daily. 12/24/19   [name], MD  methocarbamol (ROBAXIN) 500 MG tablet Take 1 tablet (500 mg total) by mouth 2 (two) times daily as needed for muscle spasms. 04/13/21   [name], Christian H, PA-C  omeprazole (PRILOSEC) 20 MG capsule Take 20 mg by mouth daily.    [provider]  potassium chloride SA (KLOR-CON) 20 MEQ tablet Take 1 tablet (20 mEq total) by mouth daily. 09/07/20   [name], NP  lisinopril (PRINIVIL,ZESTRIL) 10 MG tablet Take 1 tablet (10 mg total) by mouth daily. 12/10/16 01/17/20  [name], MD  ranitidine (ZANTAC) 150 MG tablet Take 1 tablet (150 mg total) by mouth 2 (two) times daily. 12/13/16 02/05/19  [name], [name], MD      Allergies    Lisinopril and Onion    Review of Systems   Review of Systems  Constitutional:  Negative for fever.  HENT:  Positive for congestion and rhinorrhea. Negative for facial swelling.   Eyes:  Negative for redness.  Respiratory:  Positive for cough. Negative for shortness of breath and wheezing.   Gastrointestinal:  Negative for abdominal pain and vomiting.  All other systems reviewed and  are negative.   Physical Exam Updated Vital Signs BP (!) 153/100 (BP Location: Right Arm)   Pulse 78   Temp 98.4 F (36.9 C) (Oral)   Resp 18   SpO2 97%  Physical Exam Vitals and nursing note reviewed.  Constitutional:      General: He is not in acute distress.    Appearance: Normal appearance. He is well-developed. He is not diaphoretic.  HENT:     Head: Normocephalic and atraumatic.     Nose: Nose normal.  Eyes:     Conjunctiva/sclera: Conjunctivae normal.     Pupils: Pupils are equal, round, and reactive to light.  Cardiovascular:     Rate and Rhythm: Normal rate and regular rhythm.     Pulses: Normal pulses.     Heart  sounds: Normal heart sounds.  Pulmonary:     Effort: Pulmonary effort is normal.     Breath sounds: Normal breath sounds. No wheezing or rales.  Abdominal:     General: Bowel sounds are normal.     Palpations: Abdomen is soft.     Tenderness: There is no abdominal tenderness. There is no guarding or rebound.  Musculoskeletal:        General: Normal range of motion.     Cervical back: Normal range of motion and neck supple.  Skin:    General: Skin is warm and dry.  Neurological:     Mental Status: He is alert and oriented to person, place, and time.     ED Results / Procedures / Treatments   Labs (all labs ordered are listed, but only abnormal results are displayed) Results for orders placed or performed during the hospital encounter of 02/13/22  Resp Panel by RT-PCR (Flu A&B, Covid) Anterior Nasal Swab   Specimen: Anterior Nasal Swab  Result Value Ref Range   SARS Coronavirus 2 by RT PCR POSITIVE (A) NEGATIVE   Influenza A by PCR NEGATIVE NEGATIVE   Influenza B by PCR NEGATIVE NEGATIVE  Lipase, blood  Result Value Ref Range   Lipase 31 11 - 51 U/L  Comprehensive metabolic panel  Result Value Ref Range   Sodium 137 135 - 145 mmol/L   Potassium 4.2 3.5 - 5.1 mmol/L   Chloride 104 98 - 111 mmol/L   CO2 24 22 - 32 mmol/L   Glucose, Bld 115 (H) 70 - 99 mg/dL   BUN 17 6 - 20 mg/dL   Creatinine, Ser 1.61 (H) 0.61 - 1.24 mg/dL   Calcium 9.0 8.9 - 10.3 mg/dL   Total Protein 9.1 (H) 6.5 - 8.1 g/dL   Albumin 3.7 3.5 - 5.0 g/dL   AST 16 15 - 41 U/L   ALT 13 0 - 44 U/L   Alkaline Phosphatase 60 38 - 126 U/L   Total Bilirubin 0.6 0.3 - 1.2 mg/dL   GFR, Estimated 54 (L) >60 mL/min   Anion gap 9 5 - 15  CBC  Result Value Ref Range   WBC 9.5 4.0 - 10.5 K/uL   RBC 4.79 4.22 - 5.81 MIL/uL   Hemoglobin 13.3 13.0 - 17.0 g/dL   HCT 40.7 39.0 - 52.0 %   MCV 85.0 80.0 - 100.0 fL   MCH 27.8 26.0 - 34.0 pg   MCHC 32.7 30.0 - 36.0 g/dL   RDW 14.7 11.5 - 15.5 %   Platelets 448 (H) 150  - 400 K/uL   nRBC 0.0 0.0 - 0.2 %  Urinalysis, Routine w reflex microscopic  Result Value  Ref Range   Color, Urine YELLOW YELLOW   APPearance CLEAR CLEAR   Specific Gravity, Urine >=1.030 1.005 - 1.030   pH 5.5 5.0 - 8.0   Glucose, UA NEGATIVE NEGATIVE mg/dL   Hgb urine dipstick NEGATIVE NEGATIVE   Bilirubin Urine NEGATIVE NEGATIVE   Ketones, ur NEGATIVE NEGATIVE mg/dL   Protein, ur >=300 (A) NEGATIVE mg/dL   Nitrite NEGATIVE NEGATIVE   Leukocytes,Ua NEGATIVE NEGATIVE  Urinalysis, Microscopic (reflex)  Result Value Ref Range   RBC / HPF 0-5 0 - 5 RBC/hpf   WBC, UA 0-5 0 - 5 WBC/hpf   Bacteria, UA FEW (A) NONE SEEN   Squamous Epithelial / LPF 0-5 0 - 5   Mucus PRESENT    Hyaline Casts, UA PRESENT    No results found.   EKG None  Radiology No results found.  Procedures Procedures    Medications Ordered in ED Medications - No data to display  ED Course/ Medical Decision Making/ A&P                           Medical Decision Making Amount and/or Complexity of Data Reviewed External Data Reviewed: labs and notes.    Details: Previous notes reviewed.  Baseline creatinine 1.8 Labs: ordered.    Details: All labs reviewed: covid is positive.  Normal white count 9.5, 13.3, elevated platelets 448k.  Normal sodium 137, 4.2 elevated creatinine 1.61 better than previous.  Protein in urine.    Risk Risk Details: Patient with covid, well appearing.  Normal exam and vitals.  Outside the window for antivirals. Stable for discharge.  Strict return.      Final Clinical Impression(s) / ED Diagnoses Final diagnoses:  None    Return for intractable cough, coughing up blood, fevers > 100.4 unrelieved by medication, shortness of breath, intractable vomiting, chest pain, shortness of breath, weakness, numbness, changes in speech, facial asymmetry, abdominal pain, passing out, Inability to tolerate liquids or food, cough, altered mental status or any concerns. No signs of systemic  illness or infection. The patient is nontoxic-appearing on exam and vital signs are within normal limits.  I have reviewed the triage vital signs and the nursing notes. Pertinent labs & imaging results that were available during my care of the patient were reviewed by me and considered in my medical decision making (see chart for details). After history, exam, and medical workup I feel the patient has been appropriately medically screened and is safe for discharge home. Pertinent diagnoses were discussed with the patient. Patient was given return precautions.  Rx / DC Orders ED Discharge Orders     None         Zakiah Gauthreaux, MD 02/14/22 0003

## 2022-02-13 NOTE — ED Triage Notes (Signed)
Pt has been sick with abdominal aching and today he was nauseated and dry heaved x1 and reports that "spit and a teaspoon of blood" came up.  Pt states that he has mild discomfort in his stomach, LBM was yesterday.  Pt also states that he has been feeling like he "has the flu" for the past week.

## 2022-02-14 ENCOUNTER — Ambulatory Visit (INDEPENDENT_AMBULATORY_CARE_PROVIDER_SITE_OTHER): Payer: Self-pay | Admitting: *Deleted

## 2022-02-14 NOTE — Telephone Encounter (Signed)
Summary: covid +   Pt states he tested covid + at UC on 12-3  Pt inquiring he can be prescribed antivirals  Please assist further       Chief Complaint: covid positive requesting antiviral medication Symptoms: nasal congestion blowing nose for yellow mucus, chest congestion, coughing up yellow mucus.  Frequency: 2 days ago Pertinent Negatives: Patient denies chest pain no difficulty breathing, no fever reported Disposition: '[]'$ ED /'[x]'$ Urgent Care (no appt availability in office) / '[]'$ Appointment(In office/virtual)/ '[]'$  [facility]/ '[]'$ Home Care/ '[]'$ Refused Recommended Disposition /'[]'$ [facility]/ '[x]'$  Follow-up with PCP Additional Notes:   Requesting antiviral medication. Last seen in ED 02/13/22. Patient requesting medication. Please advise if patient needs appt due to being seen in ED 02/13/22. Hx HTN. See ED encounter .Patient would like a call back. Please advise .        Reason for Disposition  [1] HIGH RISK patient (e.g., weak immune system, age > 84 years, obesity with BMI 30 or higher, pregnant, chronic lung disease or other chronic medical condition) AND [2] COVID symptoms (e.g., cough, fever)  (Exceptions: Already seen by PCP and no new or worsening symptoms.)    Hx HTN  Answer Assessment - Initial Assessment Questions 1. COVID-19 DIAGNOSIS: "How do you know that you have COVID?" (e.g., positive lab test or self-test, diagnosed by doctor or NP/PA, symptoms after exposure).     ED tested for covid and positive 02/13/22 2. COVID-19 EXPOSURE: "Was there any known exposure to COVID before the symptoms began?" CDC Definition of close contact: within 6 feet (2 meters) for a total of 15 minutes or more over a 24-hour period.      Per notes - wife  3. ONSET: "When did the COVID-19 symptoms start?"      2 days ago  4. WORST SYMPTOM: "What is your worst symptom?" (e.g., cough, fever, shortness of breath, muscle aches)     Nasal congestion, chest congestion 5. COUGH:  "Do you have a cough?" If Yes, ask: "How bad is the cough?"       Yes productive, yellow sputum 6. FEVER: "Do you have a fever?" If Yes, ask: "What is your temperature, how was it measured, and when did it start?"     na 7. RESPIRATORY STATUS: "Describe your breathing?" (e.g., normal; shortness of breath, wheezing, unable to speak)      na 8. BETTER-SAME-WORSE: "Are you getting better, staying the same or getting worse compared to yesterday?"  If getting worse, ask, "In what way?"     Better  9. OTHER SYMPTOMS: "Do you have any other symptoms?"  (e.g., chills, fatigue, headache, loss of smell or taste, muscle pain, sore throat)     Nasal congestion blow nose for yellow mucus, chest congestion coughing up yellow sputum.  10. HIGH RISK DISEASE: "Do you have any chronic medical problems?" (e.g., asthma, heart or lung disease, weak immune system, obesity, etc.)       See hx  11. VACCINE: "Have you had the COVID-19 vaccine?" If Yes, ask: "Which one, how many shots, when did you get it?"       na 12. PREGNANCY: "Is there any chance you are pregnant?" "When was your last menstrual period?"       na 13. O2 SATURATION MONITOR:  "Do you use an oxygen saturation monitor (pulse oximeter) at home?" If Yes, ask "What is your reading (oxygen level) today?" "What is your usual oxygen saturation reading?" (e.g., 95%)  na  Protocols used: Coronavirus (IRWER-15) Diagnosed or Suspected-A-AH

## 2022-02-15 NOTE — Telephone Encounter (Signed)
Will forward to provider  

## 2022-02-17 ENCOUNTER — Other Ambulatory Visit (INDEPENDENT_AMBULATORY_CARE_PROVIDER_SITE_OTHER): Payer: Self-pay | Admitting: Primary Care

## 2022-02-17 MED ORDER — NIRMATRELVIR/RITONAVIR (PAXLOVID)TABLET
3.0000 | ORAL_TABLET | Freq: Two times a day (BID) | ORAL | 0 refills | Status: AC
Start: 2022-02-17 — End: 2022-02-22

## 2022-02-17 NOTE — Telephone Encounter (Signed)
Called patient medication sent in for COVID

## 2022-02-25 ENCOUNTER — Other Ambulatory Visit: Payer: Self-pay | Admitting: Cardiology

## 2022-03-01 ENCOUNTER — Ambulatory Visit (INDEPENDENT_AMBULATORY_CARE_PROVIDER_SITE_OTHER): Payer: Commercial Managed Care - HMO | Admitting: Primary Care

## 2022-07-22 ENCOUNTER — Other Ambulatory Visit: Payer: Self-pay | Admitting: Cardiology

## 2022-07-22 DIAGNOSIS — I10 Essential (primary) hypertension: Secondary | ICD-10-CM

## 2022-07-22 NOTE — Telephone Encounter (Signed)
Refill to pharmacy 

## 2022-08-22 ENCOUNTER — Other Ambulatory Visit: Payer: Self-pay | Admitting: Cardiology

## 2022-08-22 DIAGNOSIS — I10 Essential (primary) hypertension: Secondary | ICD-10-CM

## 2022-08-28 ENCOUNTER — Other Ambulatory Visit: Payer: Self-pay | Admitting: Cardiology

## 2022-08-29 ENCOUNTER — Other Ambulatory Visit: Payer: Self-pay

## 2022-08-29 MED ORDER — ATORVASTATIN CALCIUM 20 MG PO TABS
20.0000 mg | ORAL_TABLET | Freq: Every day | ORAL | 0 refills | Status: AC
Start: 2022-08-29 — End: ?

## 2022-10-04 ENCOUNTER — Other Ambulatory Visit: Payer: Self-pay | Admitting: Cardiology

## 2022-10-04 DIAGNOSIS — I10 Essential (primary) hypertension: Secondary | ICD-10-CM

## 2022-10-23 ENCOUNTER — Other Ambulatory Visit: Payer: Self-pay | Admitting: Cardiology

## 2022-10-23 DIAGNOSIS — I10 Essential (primary) hypertension: Secondary | ICD-10-CM

## 2022-10-27 ENCOUNTER — Other Ambulatory Visit: Payer: Self-pay | Admitting: Cardiology

## 2022-11-15 ENCOUNTER — Telehealth: Payer: Self-pay | Admitting: Cardiology

## 2022-11-15 MED ORDER — AMLODIPINE BESYLATE 10 MG PO TABS: 10.0000 mg | ORAL_TABLET | Freq: Every day | ORAL | 0 refills | Status: DC

## 2022-11-15 NOTE — Telephone Encounter (Signed)
*  STAT* If patient is at the pharmacy, call can be transferred to refill team.   1. Which medications need to be refilled? (please list name of each medication and dose if known)   amLODipine (NORVASC) 10 MG tablet   2. Would you like to learn more about the convenience, safety, & potential cost savings by using the [facility]?   3. Are you open to using the [facility] (Type [facility]. ).  4. Which pharmacy/location (including street and city if local pharmacy) is medication to be sent to?  [id number] - [place], [place] - [address]   5. Do they need a 30 day or 90 day supply?   90 day  Patient stated he only has one tablet left.  Patient has appointment scheduled on 9/24.

## 2022-11-21 ENCOUNTER — Other Ambulatory Visit: Payer: Self-pay | Admitting: Cardiology

## 2022-11-21 DIAGNOSIS — I10 Essential (primary) hypertension: Secondary | ICD-10-CM

## 2022-12-05 ENCOUNTER — Other Ambulatory Visit: Payer: Self-pay

## 2022-12-05 NOTE — Progress Notes (Unsigned)
Cardiology Office Note:    Date:  12/06/2022   ID:  CYAIRE SCULTHORPE, DOB November 15, 1980, MRN [id number]  PCP:  Grayce Sessions, NP  Cardiologist:  Norman Herrlich, MD    Referring MD: Grayce Sessions, NP    ASSESSMENT:    1. Hypertensive heart disease without heart failure   2. Mild CAD   3. Stage 3a chronic kidney disease (HCC)   4. Mixed hyperlipidemia    PLAN:    In order of problems listed above:  Previous resistant hypertension now well-controlled with ambulatory measurements fortunately he is very meticulous about medications dietary sodium restriction compliant with medications and will continue his calcium channel blocker carvedilol centrally active clonidine thiazide diuretic with good hypertension control he is no longer taking hydralazine  stable no anginal discomfort on medical therapy continue aspirin beta-blocker and statin Recheck renal function today Recheck lipid profile continue with statin   Next appointment: 6 months   Medication Adjustments/Labs and Tests Ordered: Current medicines are reviewed at length with the patient today.  Concerns regarding medicines are outlined above.  Orders Placed This Encounter  Procedures   Lipid Profile   Comp Met (CMET)   No orders of the defined types were placed in this encounter.    History of Present Illness:    RIGEL KOYANAGI is a 42 y.o. male with a hx of hypertensive heart disease with stage III CKD EF 45 to 50% coronary calcium score with mild CAD less than 25% stenosis left anterior descending coronary artery and hyperlipidemia last seen 08/12/2021.  Compliance with diet, lifestyle and medications: Yes  Koalii is meticulous taking his medications salt restriction monitors his blood pressure at home His common reading is in the range of 130/70 -Asked him in the future for a few weeks before an office visit check daily bring a list with him No side effects from his medications including his  statin No cardiovascular side effects of edema shortness of breath orthopnea chest pain palpitation or syncope Past Medical History:  Diagnosis Date   AKI (acute kidney injury) (HCC) 05/20/2019   Chest pain 09/17/2020   CKD (chronic kidney disease) 12/10/2021   Depressed left ventricular ejection fraction 06/13/2019   Dyspepsia 12/10/2021   Heartburn 12/10/2021   Hypertension    Hypertensive heart disease 05/19/2019   Hypertensive urgency 05/19/2019   Hypokalemia 05/20/2019   Mixed hyperlipidemia 10/23/2019   Stage 3a chronic kidney disease (HCC) 06/13/2019   TIA (transient ischemic attack) 05/21/2019   Tobacco use 01/17/2020    Current Medications: Current Meds  Medication Sig   amLODipine (NORVASC) 10 MG tablet Take 1 tablet (10 mg total) by mouth daily. 2nd attempt, patient needs and appt for additional refills   aspirin 325 MG tablet Take 325 mg by mouth daily.   atorvastatin (LIPITOR) 20 MG tablet Take 1 tablet (20 mg total) by mouth daily.   carvedilol (COREG) 25 MG tablet Take 1 tablet (25 mg total) by mouth 2 (two) times daily with a meal. Patient must keep appointment on 12/06/22 for further refills. 2 nd attempt   cloNIDine (CATAPRES) 0.3 MG tablet Take 1 tablet (0.3 mg total) by mouth 2 (two) times daily. Patient must keep appointment for 12/06/22 for further refills. 2 nd attempt   famotidine (PEPCID) 20 MG tablet Take 20 mg by mouth at bedtime.   hydrochlorothiazide (HYDRODIURIL) 25 MG tablet Take 75 mg by mouth 3 (three) times daily as needed for edema or high blood pressure.  HYDROcodone-acetaminophen (NORCO/VICODIN) 5-325 MG tablet Take 1-2 tablets by mouth every 6 (six) hours as needed.   hydrOXYzine (ATARAX) 25 MG tablet Take 1 tablet (25 mg total) by mouth every 6 (six) hours.   Iron, Ferrous Sulfate, 325 (65 Fe) MG TABS Take 325 mg by mouth daily.   methocarbamol (ROBAXIN) 500 MG tablet Take 1 tablet (500 mg total) by mouth 2 (two) times daily as needed for muscle  spasms.   omeprazole (PRILOSEC) 20 MG capsule Take 20 mg by mouth daily.   potassium chloride SA (KLOR-CON) 20 MEQ tablet Take 1 tablet (20 mEq total) by mouth daily.      EKGs/Labs/Other Studies Reviewed:    The following studies were reviewed today:  Cardiac Studies & Procedures       ECHOCARDIOGRAM  ECHOCARDIOGRAM COMPLETE 05/20/2019  Narrative ECHOCARDIOGRAM REPORT    Patient Name:   ROBERTO GRAESER Date of Exam: 05/20/2019 Medical Rec #:  [id number]           Height:       73.0 in Accession #:    [id number]          Weight:       218.9 lb Date of Birth:  [birth year]          BSA:          2.236 m Patient Age:    38 years            BP:           188/132 mmHg Patient Gender: M                   HR:           77 bpm. Exam Location:  Inpatient  Procedure: 2D Echo  Indications:    TIA  History:        Patient has no prior history of Echocardiogram examinations. Risk Factors:Hypertension.  Sonographer:    Thurman Coyer RDCS (AE) Referring Phys: [id number] Boyce Medici GONFA  IMPRESSIONS   1. Mild global reduction in LV systolic function; moderate LVH; grade 1 diastolic dysfunction. 2. Left ventricular ejection fraction, by estimation, is 45 to 50%. The left ventricle has mildly decreased function. The left ventricle demonstrates global hypokinesis. There is moderate left ventricular hypertrophy. Left ventricular diastolic parameters are consistent with Grade I diastolic dysfunction (impaired relaxation). 3. Right ventricular systolic function is normal. The right ventricular size is normal. 4. The mitral valve is normal in structure. Trivial mitral valve regurgitation. No evidence of mitral stenosis. 5. The aortic valve is normal in structure. Aortic valve regurgitation is not visualized. No aortic stenosis is present. 6. The inferior vena cava is normal in size with greater than 50% respiratory variability, suggesting right atrial pressure of 3 mmHg.  FINDINGS Left  Ventricle: Left ventricular ejection fraction, by estimation, is 45 to 50%. The left ventricle has mildly decreased function. The left ventricle demonstrates global hypokinesis. The left ventricular internal cavity size was normal in size. There is moderate left ventricular hypertrophy. Left ventricular diastolic parameters are consistent with Grade I diastolic dysfunction (impaired relaxation).  Right Ventricle: The right ventricular size is normal.Right ventricular systolic function is normal.  Left Atrium: Left atrial size was normal in size.  Right Atrium: Right atrial size was normal in size.  Pericardium: Trivial pericardial effusion is present.  Mitral Valve: The mitral valve is normal in structure. Normal mobility of the mitral valve leaflets. Trivial mitral valve regurgitation. No evidence of mitral  valve stenosis.  Tricuspid Valve: The tricuspid valve is normal in structure. Tricuspid valve regurgitation is trivial. No evidence of tricuspid stenosis.  Aortic Valve: The aortic valve is normal in structure. Aortic valve regurgitation is not visualized. No aortic stenosis is present.  Pulmonic Valve: The pulmonic valve was normal in structure. Pulmonic valve regurgitation is not visualized. No evidence of pulmonic stenosis.  Aorta: The aortic root is normal in size and structure.  Venous: The inferior vena cava is normal in size with greater than 50% respiratory variability, suggesting right atrial pressure of 3 mmHg.  IAS/Shunts: No atrial level shunt detected by color flow Doppler.  Additional Comments: Mild global reduction in LV systolic function; moderate LVH; grade 1 diastolic dysfunction.   LEFT VENTRICLE PLAX 2D LVIDd:         5.00 cm  Diastology LVIDs:         3.60 cm  LV e' lateral:   6.48 cm/s LV PW:         1.40 cm  LV E/e' lateral: 9.4 LV IVS:        1.30 cm  LV e' medial:    4.85 cm/s LVOT diam:     2.50 cm  LV E/e' medial:  12.6 LV SV:         103 LV SV  Index:   46 LVOT Area:     4.91 cm   RIGHT VENTRICLE             IVC RV Basal diam:  3.20 cm     IVC diam: 2.10 cm RV Mid diam:    3.70 cm RV S prime:     16.20 cm/s TAPSE (M-mode): 2.6 cm  LEFT ATRIUM             Index       RIGHT ATRIUM           Index LA diam:        4.50 cm 2.01 cm/m  RA Area:     16.00 cm LA Vol (A2C):   37.4 ml 16.73 ml/m RA Volume:   36.00 ml  16.10 ml/m LA Vol (A4C):   49.2 ml 22.00 ml/m LA Biplane Vol: 45.9 ml 20.53 ml/m AORTIC VALVE LVOT Vmax:   124.00 cm/s LVOT Vmean:  79.100 cm/s LVOT VTI:    0.210 m  AORTA Ao Root diam: 3.60 cm  MITRAL VALVE MV Area (PHT): 3.83 cm    SHUNTS MV Decel Time: 198 msec    Systemic VTI:  0.21 m MV E velocity: 60.90 cm/s  Systemic Diam: 2.50 cm MV A velocity: 69.00 cm/s MV E/A ratio:  0.88  Olga Millers MD Electronically signed by Olga Millers MD Signature Date/Time: 05/20/2019/3:29:32 PM    Final     CT SCANS  CT CORONARY MORPH W/CTA COR W/SCORE 03/19/2021  Narrative CLINICAL DATA:  42 year old male with chest pain.  EXAM: Cardiac/Coronary  CTA  TECHNIQUE: The patient was scanned on a [employer].  FINDINGS: A 100 kV prospective scan was triggered in the descending thoracic aorta at 111 HU's. Axial non-contrast 3 mm slices were carried out through the heart. The data set was analyzed on a dedicated work station and scored using the Agatson method. Gantry rotation speed was 250 msecs and collimation was .6 mm. 0.8 mg of sl NTG was given. The 3D data set was reconstructed in 5% intervals of the 67-82 % of the R-R cycle. Diastolic phases were analyzed on a  dedicated work station using MPR, MIP and VRT modes. The patient received 80 cc of contrast.  Image quality: good  Aorta:  Normal size.  No calcifications.  No dissection.  Aortic Valve:  Trileaflet.  No calcifications.  Coronary Arteries:  Normal coronary origin.  Right dominance.  RCA is a large dominant artery that  gives rise to PDA and PLA. There is no plaque.  Left main is a large artery that gives rise to LAD and LCX arteries.  LAD is a large vessel that has no plaque. There is small focal mixed calcified plaque in the second diagonal branch, 0-24% stenosis.  LCX is a non-dominant artery that gives rise to one large OM1 branch. There is no plaque.  Other findings:  Normal pulmonary vein drainage into the left atrium.  Normal left atrial appendage without a thrombus.  Normal size of the pulmonary artery.  Please see radiology report for non cardiac findings. CTA of aorta done in sequence with this study showed no extra cardiac findings.  IMPRESSION: 1. Coronary calcium score of 5.  2. Normal coronary origin with right dominance.  3. There is small focal mixed calcified plaque in the second diagonal branch, 0-24% stenosis.  CAD-RADS 1. Minimal non-obstructive CAD (0-24%). Consider non-atherosclerotic causes of chest pain. Consider preventive therapy and risk factor modification.   Electronically Signed By: Donato Schultz M.D. On: 03/19/2021 14:35              Recent Labs: 02/13/2022: ALT 13; BUN 17; Creatinine, Ser 1.61; Hemoglobin 13.3; Platelets 448; Potassium 4.2; Sodium 137  Recent Lipid Panel    Component Value Date/Time   CHOL 150 02/18/2021 1544   TRIG 351 (H) 02/18/2021 1544   HDL 28 (L) 02/18/2021 1544   CHOLHDL 5.4 (H) 02/18/2021 1544   CHOLHDL 9.6 05/20/2019 0627   VLDL 35 05/20/2019 0627   LDLCALC 67 02/18/2021 1544    Physical Exam:    VS:  BP (!) 146/80   Pulse 66   Ht 6\' 1"  (1.854 m)   Wt 212 lb 9.6 oz (96.4 kg)   SpO2 95%   BMI 28.05 kg/m     Wt Readings from Last 3 Encounters:  12/06/22 212 lb 9.6 oz (96.4 kg)  08/12/21 216 lb 1.9 oz (98 kg)  06/16/21 215 lb (97.5 kg)     GEN:  Well nourished, well developed in no acute distress HEENT: Normal NECK: No JVD; No carotid bruits LYMPHATICS: No lymphadenopathy CARDIAC: RRR, no murmurs, rubs,  gallops RESPIRATORY:  Clear to auscultation without rales, wheezing or rhonchi  ABDOMEN: Soft, non-tender, non-distended MUSCULOSKELETAL:  No edema; No deformity  SKIN: Warm and dry NEUROLOGIC:  Alert and oriented x 3 PSYCHIATRIC:  Normal affect    Signed, Norman Herrlich, MD  12/06/2022 4:20 PM    [facility]

## 2022-12-06 ENCOUNTER — Encounter: Payer: Self-pay | Admitting: Cardiology

## 2022-12-06 ENCOUNTER — Ambulatory Visit: Payer: BLUE CROSS/BLUE SHIELD | Attending: Cardiology | Admitting: Cardiology

## 2022-12-06 VITALS — BP 146/80 | HR 66 | Ht 73.0 in | Wt 212.6 lb

## 2022-12-06 DIAGNOSIS — I119 Hypertensive heart disease without heart failure: Secondary | ICD-10-CM

## 2022-12-06 DIAGNOSIS — I2510 Atherosclerotic heart disease of native coronary artery without angina pectoris: Secondary | ICD-10-CM

## 2022-12-06 DIAGNOSIS — N1831 Chronic kidney disease, stage 3a: Secondary | ICD-10-CM

## 2022-12-06 DIAGNOSIS — E782 Mixed hyperlipidemia: Secondary | ICD-10-CM

## 2022-12-06 NOTE — Patient Instructions (Signed)
Medication Instructions:  Your physician recommends that you continue on your current medications as directed. Please refer to the Current Medication list given to you today.  *If you need a refill on your cardiac medications before your next appointment, please call your pharmacy*   Lab Work: Your physician recommends that you return for lab work in:   Labs today: CMP, Lipids  If you have labs (blood work) drawn today and your tests are completely normal, you will receive your results only by: MyChart Message (if you have MyChart) OR A paper copy in the mail If you have any lab test that is abnormal or we need to change your treatment, we will call you to review the results.   Testing/Procedures: None   Follow-Up: At [facility], you and your health needs are our priority.  As part of our continuing mission to provide you with exceptional heart care, we have created designated Provider Care Teams.  These Care Teams include your primary Cardiologist (physician) and Advanced Practice Providers (APPs -  Physician Assistants and Nurse Practitioners) who all work together to provide you with the care you need, when you need it.  We recommend signing up for the patient portal called "MyChart".  Sign up information is provided on this After Visit Summary.  MyChart is used to connect with patients for Virtual Visits (Telemedicine).  Patients are able to view lab/test results, encounter notes, upcoming appointments, etc.  Non-urgent messages can be sent to your provider as well.   To learn more about what you can do with MyChart, go to [email or web address].    Your next appointment:   6 month(s)  Provider:   Norman Herrlich, MD    Other Instructions None

## 2022-12-07 LAB — COMPREHENSIVE METABOLIC PANEL
ALT: 11 IU/L (ref 0–44)
AST: 13 IU/L (ref 0–40)
Albumin: 4.3 g/dL (ref 4.1–5.1)
Alkaline Phosphatase: 76 IU/L (ref 44–121)
BUN/Creatinine Ratio: 10 (ref 9–20)
BUN: 15 mg/dL (ref 6–24)
Bilirubin Total: 0.4 mg/dL (ref 0.0–1.2)
CO2: 23 mmol/L (ref 20–29)
Calcium: 9.2 mg/dL (ref 8.7–10.2)
Chloride: 102 mmol/L (ref 96–106)
Creatinine, Ser: 1.55 mg/dL — ABNORMAL HIGH (ref 0.76–1.27)
Globulin, Total: 3.6 g/dL (ref 1.5–4.5)
Glucose: 91 mg/dL (ref 70–99)
Potassium: 4.2 mmol/L (ref 3.5–5.2)
Sodium: 141 mmol/L (ref 134–144)
Total Protein: 7.9 g/dL (ref 6.0–8.5)
eGFR: 57 mL/min/{1.73_m2} — ABNORMAL LOW (ref >59)

## 2022-12-07 LAB — LIPID PANEL
Chol/HDL Ratio: 5.9 ratio — ABNORMAL HIGH (ref 0.0–5.0)
Cholesterol, Total: 176 mg/dL (ref 100–199)
HDL: 30 mg/dL — ABNORMAL LOW (ref >39)
LDL Chol Calc (NIH): 120 mg/dL — ABNORMAL HIGH (ref 0–99)
Triglycerides: 145 mg/dL (ref 0–149)
VLDL Cholesterol Cal: 26 mg/dL (ref 5–40)

## 2022-12-08 ENCOUNTER — Encounter (HOSPITAL_BASED_OUTPATIENT_CLINIC_OR_DEPARTMENT_OTHER): Payer: Self-pay | Admitting: Emergency Medicine

## 2022-12-08 ENCOUNTER — Other Ambulatory Visit: Payer: Self-pay

## 2022-12-08 ENCOUNTER — Emergency Department (HOSPITAL_BASED_OUTPATIENT_CLINIC_OR_DEPARTMENT_OTHER)
Admission: EM | Admit: 2022-12-08 | Discharge: 2022-12-08 | Disposition: A | Discharge status: Home / Self Care | Payer: BLUE CROSS/BLUE SHIELD | Attending: Emergency Medicine | Admitting: Emergency Medicine

## 2022-12-08 DIAGNOSIS — K0889 Other specified disorders of teeth and supporting structures: Secondary | ICD-10-CM | POA: Insufficient documentation

## 2022-12-08 DIAGNOSIS — Z7982 Long term (current) use of aspirin: Secondary | ICD-10-CM | POA: Diagnosis not present

## 2022-12-08 DIAGNOSIS — Z79899 Other long term (current) drug therapy: Secondary | ICD-10-CM | POA: Insufficient documentation

## 2022-12-08 DIAGNOSIS — I10 Essential (primary) hypertension: Secondary | ICD-10-CM | POA: Insufficient documentation

## 2022-12-08 MED ORDER — AMOXICILLIN-POT CLAVULANATE 875-125 MG PO TABS: 1.0000 | ORAL_TABLET | Freq: Two times a day (BID) | ORAL | 0 refills | Status: DC

## 2022-12-08 NOTE — Discharge Instructions (Addendum)
They for letting us take care of you today.  I do not currently see an abscess needing draining.  Please make appointment with dentist for routine dental hygiene.  I have prescribed an antibiotic that you have taken in the past for dental pain in case there is any underlying infection to your pharmacy at [address] in [place].  Please take ibuprofen as needed for pain and anti-inflammatory properties.  You can take 400mg  (for moderate pain) to 800 mg (severe pain) every 4-6 hours as needed for pain  Please follow-up with primary care provider regarding routine medical maintenance

## 2022-12-08 NOTE — ED Provider Notes (Signed)
[name] Provider Note   CSN: [id number] Arrival date & time: 12/08/22  1300     History  Chief Complaint  Patient presents with   Dental Pain    Russell Hernandez is a 42 y.o. male with past medical history of hypertension presents to emergency department for evaluation of right upper jaw pain and swelling.  Patient reports that he has been unable to see dentist due to not currently having insurance but his work should be provide insurance within this month.  He denies fever, difficulty swallowing, hoarseness.  Upon chart review, he has had known dental disease in the right upper jaw with broken teeth, known cavities since ED visit 10/2019.  The history is provided by the patient. No language interpreter was used.  Dental Pain Associated symptoms: no congestion, no drooling and no fever       Home Medications Prior to Admission medications   Medication Sig Start Date End Date Taking? Authorizing Provider  amoxicillin-clavulanate (AUGMENTIN) 875-125 MG tablet Take 1 tablet by mouth every 12 (twelve) hours. 12/08/22  Yes Judithann Sheen, PA  amLODipine (NORVASC) 10 MG tablet Take 1 tablet (10 mg total) by mouth daily. 2nd attempt, patient needs and appt for additional refills 11/15/22   Baldo Daub, MD  aspirin 325 MG tablet Take 325 mg by mouth daily.    [provider]  atorvastatin (LIPITOR) 20 MG tablet Take 1 tablet (20 mg total) by mouth daily. 08/29/22   Baldo Daub, MD  carvedilol (COREG) 25 MG tablet Take 1 tablet (25 mg total) by mouth 2 (two) times daily with a meal. Patient must keep appointment on 12/06/22 for further refills. 2 nd attempt 11/21/22   Baldo Daub, MD  cloNIDine (CATAPRES) 0.3 MG tablet Take 1 tablet (0.3 mg total) by mouth 2 (two) times daily. Patient must keep appointment for 12/06/22 for further refills. 2 nd attempt 10/24/22   Baldo Daub, MD  famotidine (PEPCID) 20 MG tablet Take 20 mg  by mouth at bedtime.    [provider]  hydrochlorothiazide (HYDRODIURIL) 25 MG tablet Take 75 mg by mouth 3 (three) times daily as needed for edema or high blood pressure.    [provider]  HYDROcodone-acetaminophen (NORCO/VICODIN) 5-325 MG tablet Take 1-2 tablets by mouth every 6 (six) hours as needed. 04/13/21   [name], Christian H, PA-C  hydrOXYzine (ATARAX) 25 MG tablet Take 1 tablet (25 mg total) by mouth every 6 (six) hours. 06/16/21   Redwine, Madison A, PA-C  Iron, Ferrous Sulfate, 325 (65 Fe) MG TABS Take 325 mg by mouth daily. 12/24/19   Hoy Register, MD  methocarbamol (ROBAXIN) 500 MG tablet Take 1 tablet (500 mg total) by mouth 2 (two) times daily as needed for muscle spasms. 04/13/21   [name], Christian H, PA-C  omeprazole (PRILOSEC) 20 MG capsule Take 20 mg by mouth daily.    [provider]  potassium chloride SA (KLOR-CON) 20 MEQ tablet Take 1 tablet (20 mEq total) by mouth daily. 09/07/20   Grayce Sessions, NP  lisinopril (PRINIVIL,ZESTRIL) 10 MG tablet Take 1 tablet (10 mg total) by mouth daily. 12/10/16 01/17/20  Derwood Kaplan, MD  ranitidine (ZANTAC) 150 MG tablet Take 1 tablet (150 mg total) by mouth 2 (two) times daily. 12/13/16 02/05/19  [name], Barbara Cower, MD      Allergies    Lisinopril and Onion    Review of Systems   Review of  Systems  Constitutional:  Negative for fever.  HENT:  Positive for dental problem. Negative for congestion, drooling, sinus pain, sore throat, trouble swallowing and voice change.   Respiratory:  Negative for choking and shortness of breath.   Cardiovascular:  Negative for chest pain.  Gastrointestinal:  Negative for abdominal pain, diarrhea, nausea and vomiting.    Physical Exam Updated Vital Signs BP (!) 180/109 (BP Location: Left Arm)   Pulse 69   Temp 98.2 F (36.8 C) (Oral)   Resp 16   Ht 6\' 1"  (1.854 m)   Wt 99.8 kg   SpO2 99%   BMI 29.03 kg/m  Physical Exam Vitals and nursing note reviewed.   Constitutional:      General: He is not in acute distress.    Appearance: Normal appearance. He is not ill-appearing or toxic-appearing.  HENT:     Head: Normocephalic and atraumatic.     Jaw: No swelling.     Comments: No submandibular swelling, drooling, tongue protrusion No erythema, TTP nor warmth to submandibular or perioral area Uvula midline No abscess or fluctuance noted in mouth Multiple previously treated dental caries Cracked right upper molar Pain when opening jaw    Nose: Nose normal. No congestion or rhinorrhea.     Mouth/Throat:     Mouth: Mucous membranes are moist.     Pharynx: No oropharyngeal exudate or posterior oropharyngeal erythema.  Eyes:     Conjunctiva/sclera: Conjunctivae normal.  Cardiovascular:     Rate and Rhythm: Normal rate.  Pulmonary:     Effort: Pulmonary effort is normal. No respiratory distress.     Breath sounds: No stridor. No wheezing or rhonchi.  Chest:     Chest wall: No tenderness.  Abdominal:     General: Bowel sounds are normal. There is no distension.     Palpations: Abdomen is soft.     Tenderness: There is no abdominal tenderness. There is no guarding.  Musculoskeletal:     Cervical back: Normal range of motion and neck supple. No rigidity or tenderness.  Lymphadenopathy:     Cervical: No cervical adenopathy.  Skin:    General: Skin is warm.     Capillary Refill: Capillary refill takes less than 2 seconds.     Coloration: Skin is not jaundiced or pale.  Neurological:     Mental Status: He is alert. Mental status is at baseline.     ED Results / Procedures / Treatments   Labs (all labs ordered are listed, but only abnormal results are displayed) Labs Reviewed - No data to display  EKG None  Radiology No results found.  Procedures Procedures    Medications Ordered in ED Medications - No data to display  ED Course/ Medical Decision Making/ A&P                                 Medical Decision  Making Risk Prescription drug management.   Patient presents to the ED for concern of right upper dental pain, this involves an extensive number of treatment options, and is a complaint that carries with it a high risk of complications and morbidity.  The differential diagnosis includes dental caries, infection, abscess, Ludwig's angina   External records from outside source obtained and reviewed including previous chart reviews   Lab Tests:  I do not feel that lab work is required for treatment or diagnosis plan   Imaging Studies ordered:  This time I do not believe that imaging is required at this patient is reassuring physical exam, no abscess or fluctuance noted   Cardiac Monitoring:  I do not feel that cardiac monitoring was required as patient has no complaints of chest pain, shortness of breath, tachycardia.  Signs WNL   Medicines ordered and prescription drug management:  I ordered Augmentin to be picked up from pharmacy to cover for any underlying infection due to risk factor of poor dental hygiene and multiple dental caries    Problem List / ED Course:  Dental pain   Social Determinants of Health:  Provided patient with dental information to obtain dentist and improve dental hygiene   Dispostion:  After consideration of the diagnostic results and the patients response to treatment, I feel that the patent would benefit from outpatient management of dental pain with Tylenol and ibuprofen and to cover for possible underlying infection with Augmentin.  I did not believe that lab work or imaging was required for treatment or disposition plan.  I provided patient with resources for dentist to improve dental hygiene.  Discussed return to emergency department precautions with patient to include but not limited to worsening of pain, submandibular swelling, abscess, difficulty swallowing, change in voice, chest pain, shortness of breath.  Patient expresses understanding  and agrees with treatment plan.  Patient is to follow-up with dentist and primary care provider.        Final Clinical Impression(s) / ED Diagnoses Final diagnoses:  Pain, dental    Rx / DC Orders ED Discharge Orders          Ordered    amoxicillin-clavulanate (AUGMENTIN) 875-125 MG tablet  Every 12 hours        12/08/22 1438              Judithann Sheen, PA 12/09/22 0020    Sloan Leiter, DO 12/14/22 [phone]

## 2022-12-08 NOTE — ED Triage Notes (Signed)
Upper rt tooth pain and abscess x 1 week

## 2022-12-11 ENCOUNTER — Other Ambulatory Visit: Payer: Self-pay | Admitting: Cardiology

## 2022-12-11 DIAGNOSIS — I10 Essential (primary) hypertension: Secondary | ICD-10-CM

## 2022-12-12 ENCOUNTER — Other Ambulatory Visit: Payer: Self-pay

## 2022-12-12 ENCOUNTER — Telehealth: Payer: Self-pay | Admitting: Cardiology

## 2022-12-12 DIAGNOSIS — I10 Essential (primary) hypertension: Secondary | ICD-10-CM

## 2022-12-12 MED ORDER — AMLODIPINE BESYLATE 10 MG PO TABS: 10.0000 mg | ORAL_TABLET | Freq: Every day | ORAL | 3 refills | Status: DC

## 2022-12-12 MED ORDER — CLONIDINE HCL 0.3 MG PO TABS
0.3000 mg | ORAL_TABLET | Freq: Two times a day (BID) | ORAL | 3 refills | Status: DC
Start: 2022-12-12 — End: 2023-11-16

## 2022-12-12 MED ORDER — ATORVASTATIN CALCIUM 20 MG PO TABS: 20.0000 mg | ORAL_TABLET | Freq: Every day | ORAL | 3 refills | Status: DC

## 2022-12-12 MED ORDER — CARVEDILOL 25 MG PO TABS: 25.0000 mg | ORAL_TABLET | Freq: Two times a day (BID) | ORAL | 3 refills | Status: DC

## 2022-12-12 NOTE — Telephone Encounter (Signed)
*  STAT* If patient is at the pharmacy, call can be transferred to refill team.   1. Which medications need to be refilled? (please list name of each medication and dose if known) amLODipine (NORVASC) 10 MG tablet   atorvastatin (LIPITOR) 20 MG tablet   carvedilol (COREG) 25 MG tablet   cloNIDine (CATAPRES) 0.3 MG tablet    2. Which pharmacy/location (including street and city if local pharmacy) is medication to be sent to?  [id number] - [place], [place] - [address]    3. Do they need a 30 day or 90 day supply? 90  Patient had an appt on 9/24

## 2022-12-26 ENCOUNTER — Other Ambulatory Visit: Payer: Self-pay

## 2022-12-26 ENCOUNTER — Telehealth: Payer: Self-pay | Admitting: Cardiology

## 2022-12-26 DIAGNOSIS — I10 Essential (primary) hypertension: Secondary | ICD-10-CM

## 2022-12-26 MED ORDER — CARVEDILOL 25 MG PO TABS
25.0000 mg | ORAL_TABLET | Freq: Two times a day (BID) | ORAL | 3 refills | Status: DC
Start: 2022-12-26 — End: 2024-01-15

## 2022-12-26 MED ORDER — ATORVASTATIN CALCIUM 20 MG PO TABS: 20.0000 mg | ORAL_TABLET | Freq: Every day | ORAL | 3 refills | Status: DC

## 2022-12-26 NOTE — Telephone Encounter (Signed)
*  STAT* If patient is at the pharmacy, call can be transferred to refill team.   1. Which medications need to be refilled? (please list name of each medication and dose if known) atorvastatin (LIPITOR) 20 MG tablet ;  carvedilol (COREG) 25 MG tablet   2. Would you like to learn more about the convenience, safety, & potential cost savings by using the [facility]? no     3. Are you open to using the [facility] (Type [facility]. No ).   4. Which pharmacy/location (including street and city if local pharmacy) is medication to be sent to? [facility] - [place], [place] - [address]    5. Do they need a 30 day or 90 day supply? 90

## 2022-12-26 NOTE — Telephone Encounter (Signed)
Patient medications were re-filled and the patient was made aware. Patient had no further questions at this time.

## 2023-01-06 IMAGING — CR DG CHEST 2V
2 series · 2 of 2 positions shown · non-contrast
Comparison: 12/29/2020

CLINICAL DATA: Intermittent chest pain over the last few days.

EXAM:
CHEST - 2 VIEW

[w chest pa]
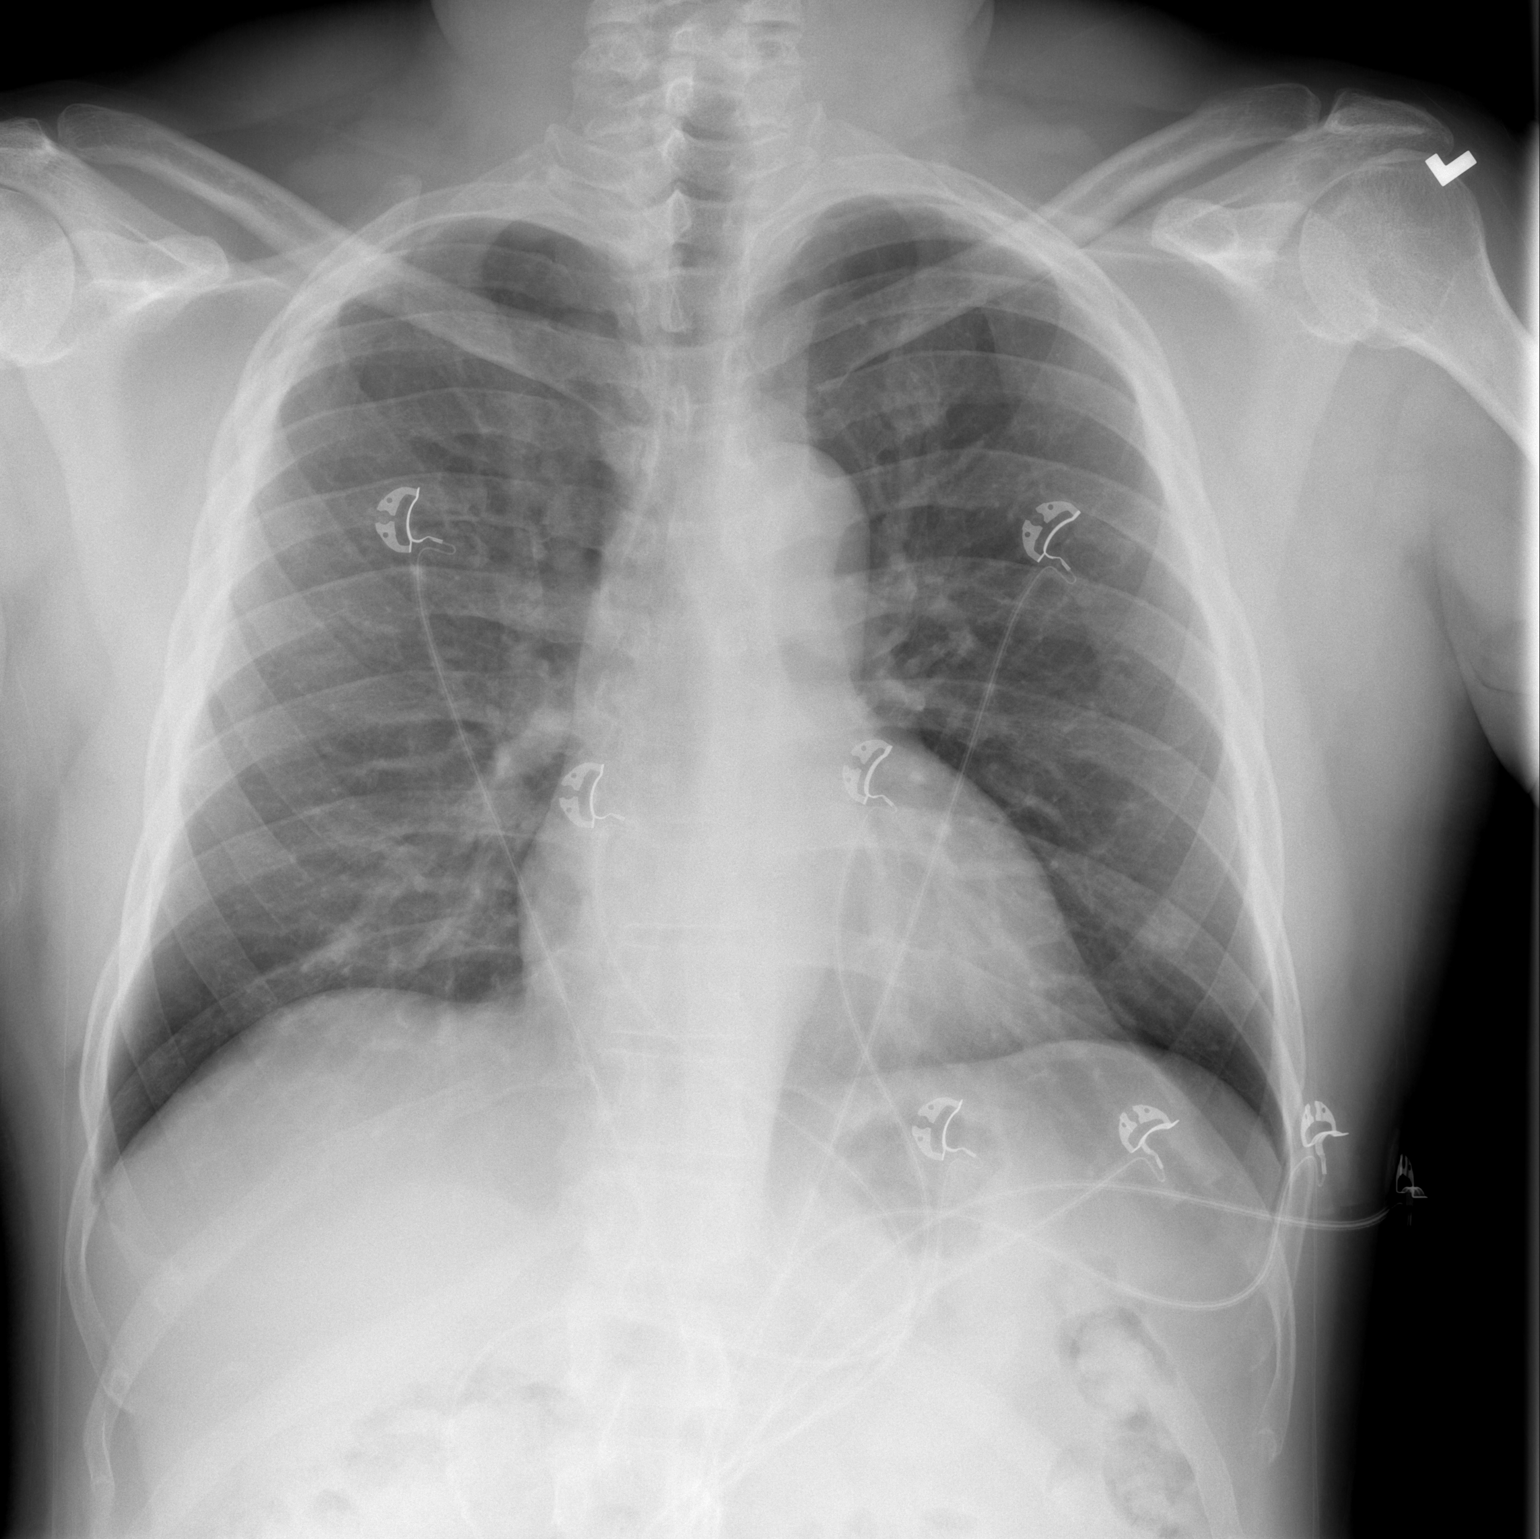

[w chest lat]
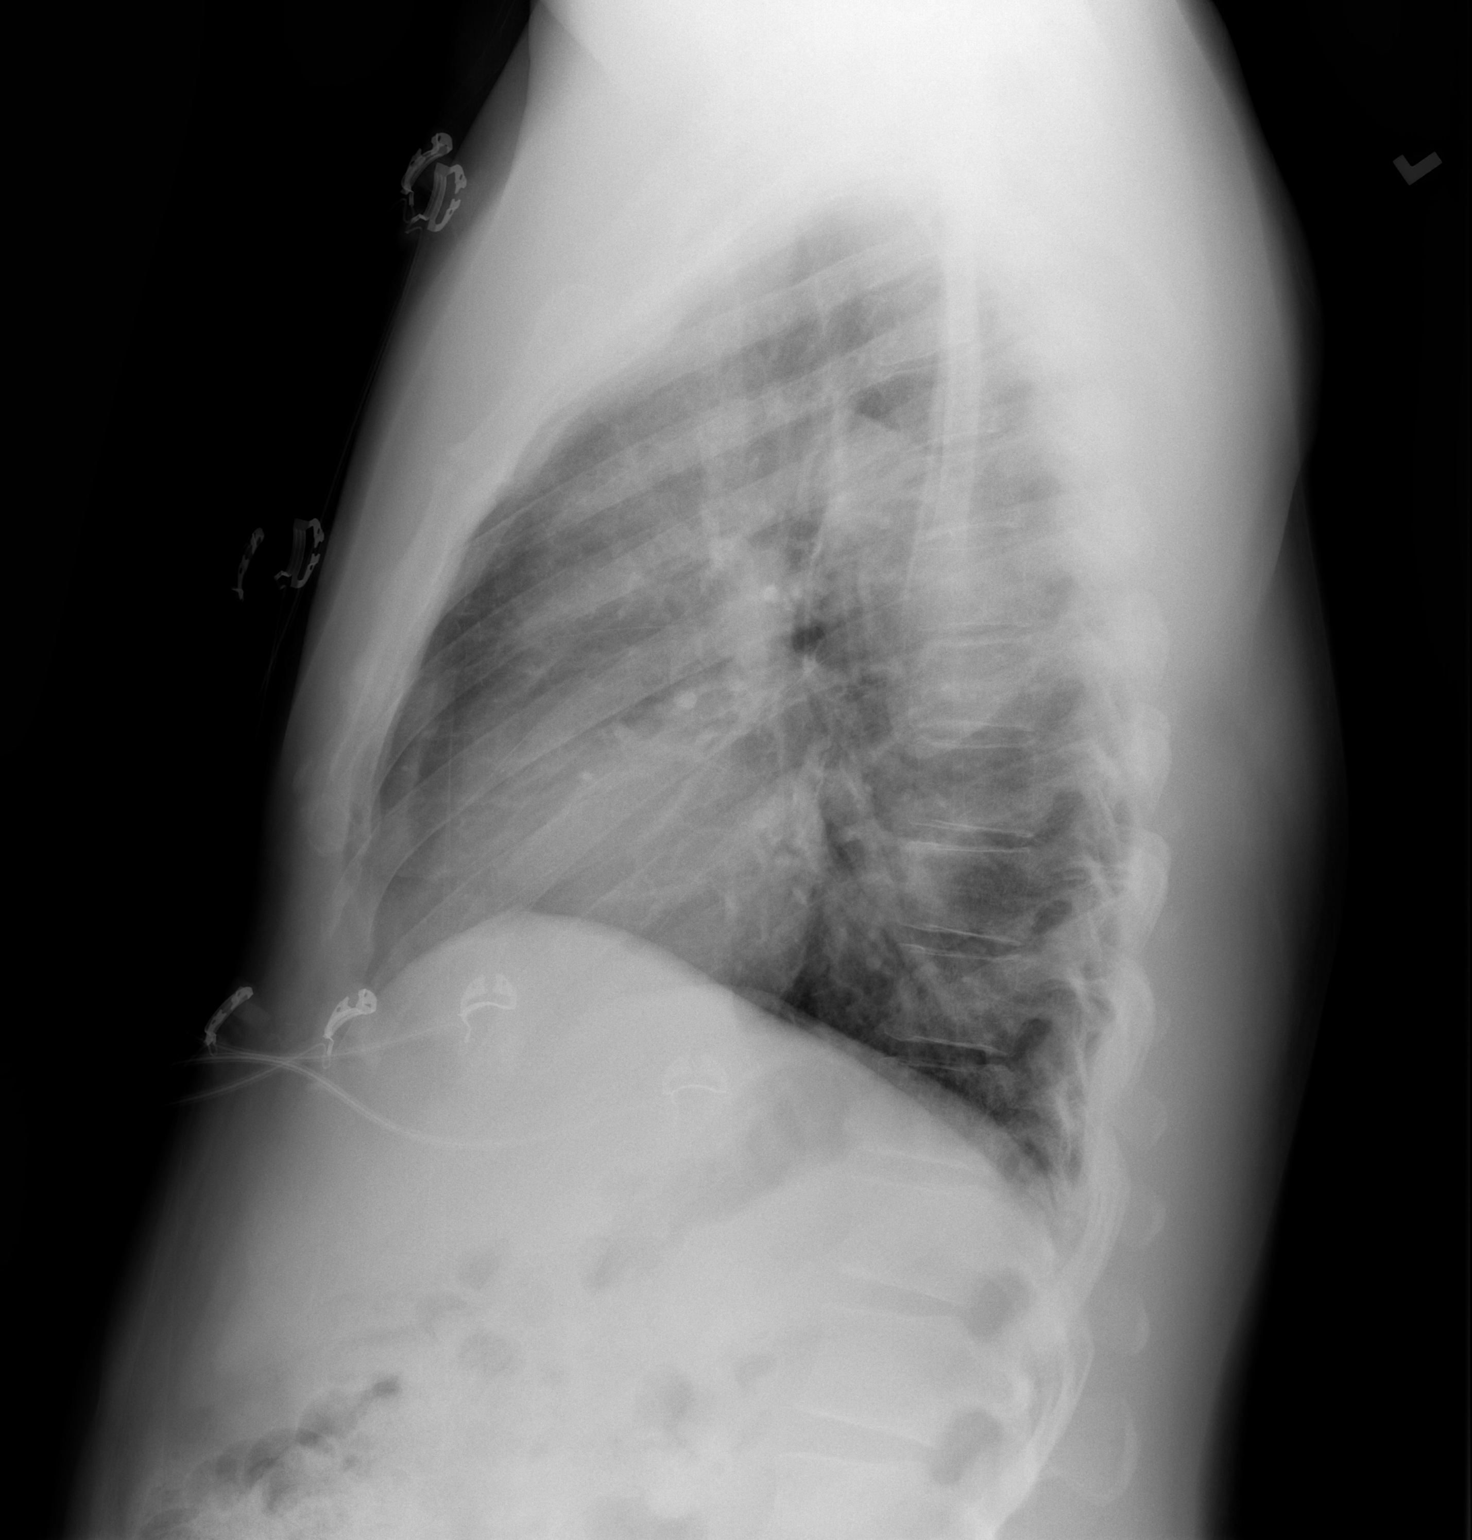

[2 of 2 positions shown; findings below may reference images not displayed]

FINDINGS: The heart size and mediastinal contours are within normal limits.
Both lungs are clear. The visualized skeletal structures are
unremarkable.
IMPRESSION: No active cardiopulmonary disease.

## 2023-01-08 IMAGING — CR DG CHEST 2V
2 series · 2 of 2 positions shown · non-contrast
Comparison: 01/25/2021

CLINICAL DATA: Chest pain.

EXAM:
CHEST - 2 VIEW

[w chest pa]
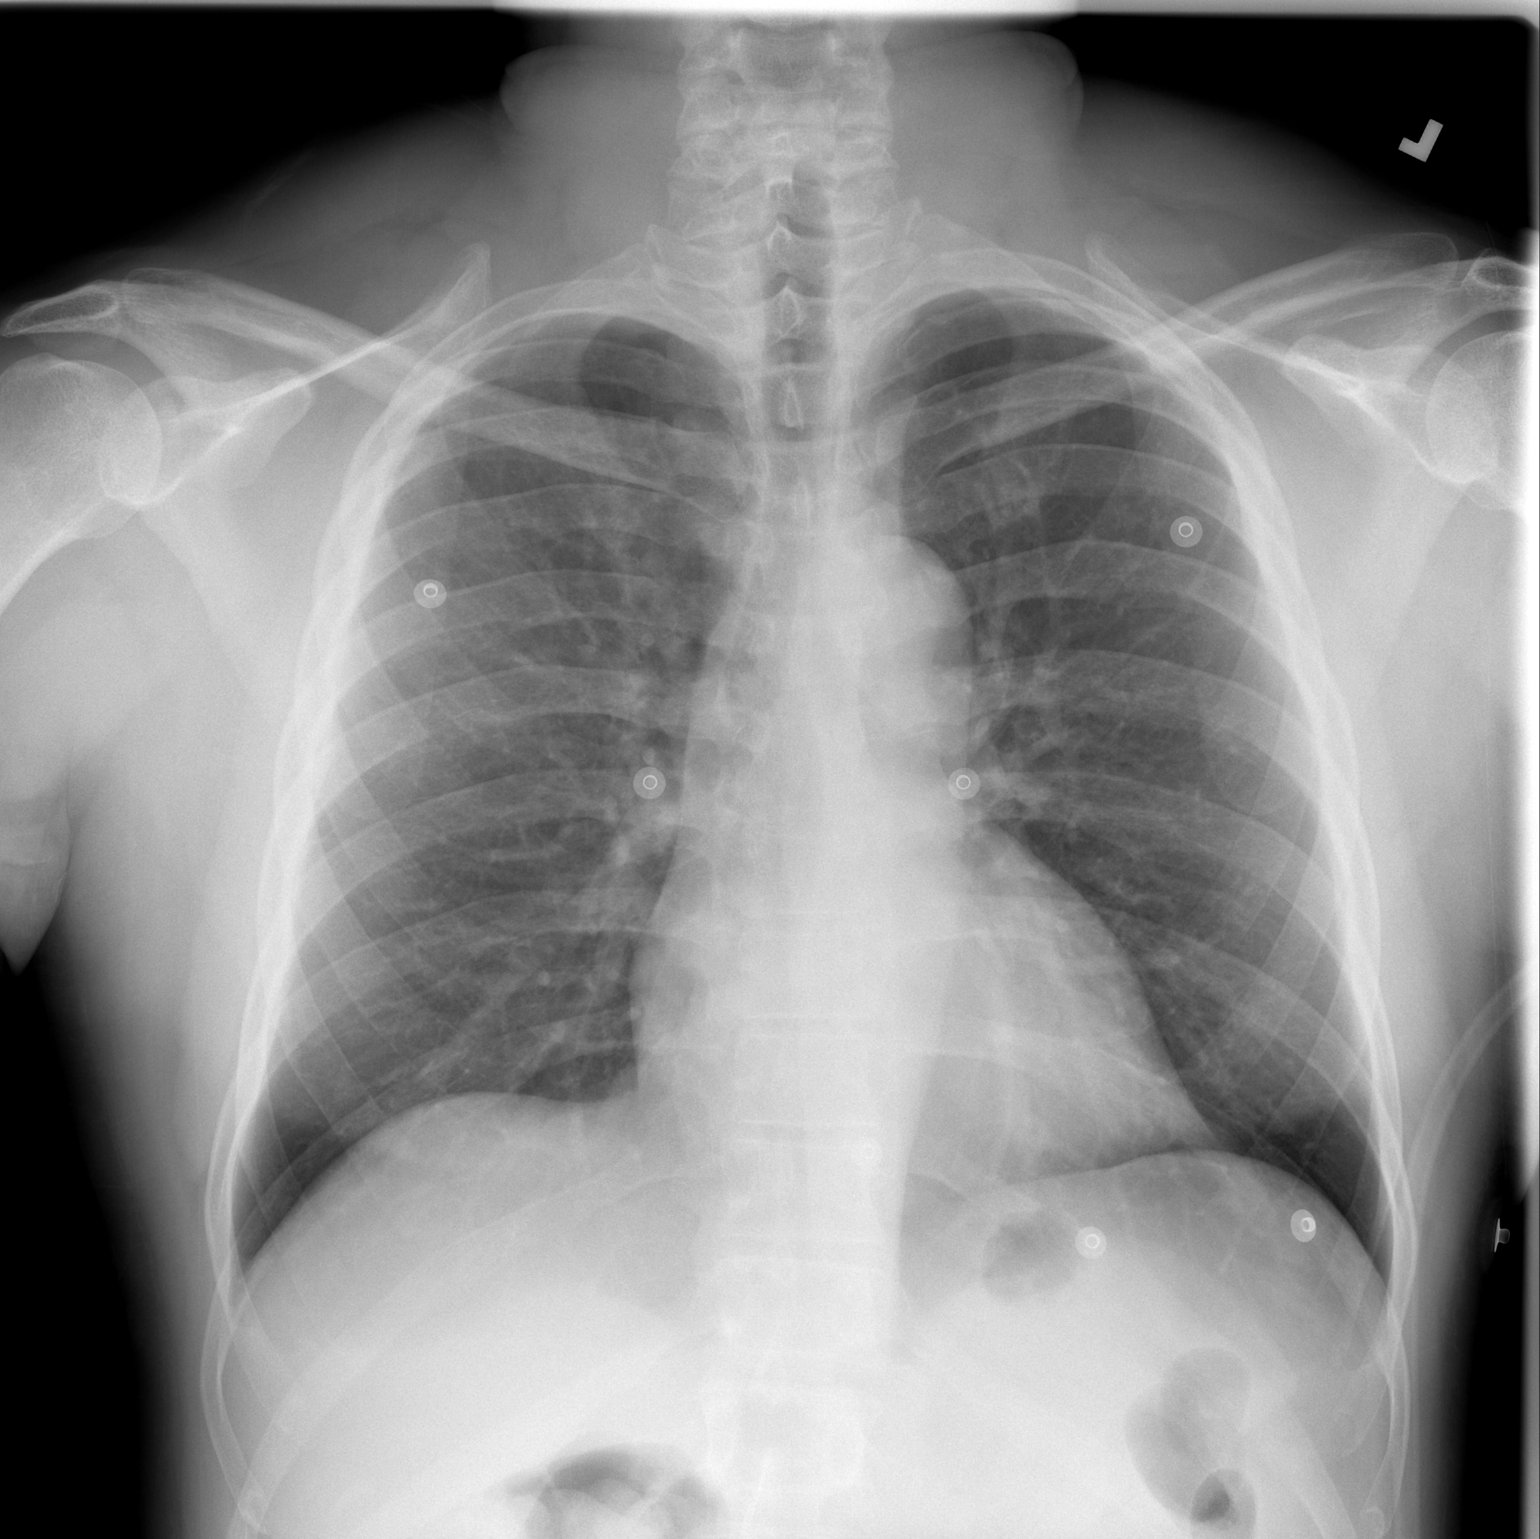

[w chest lat]
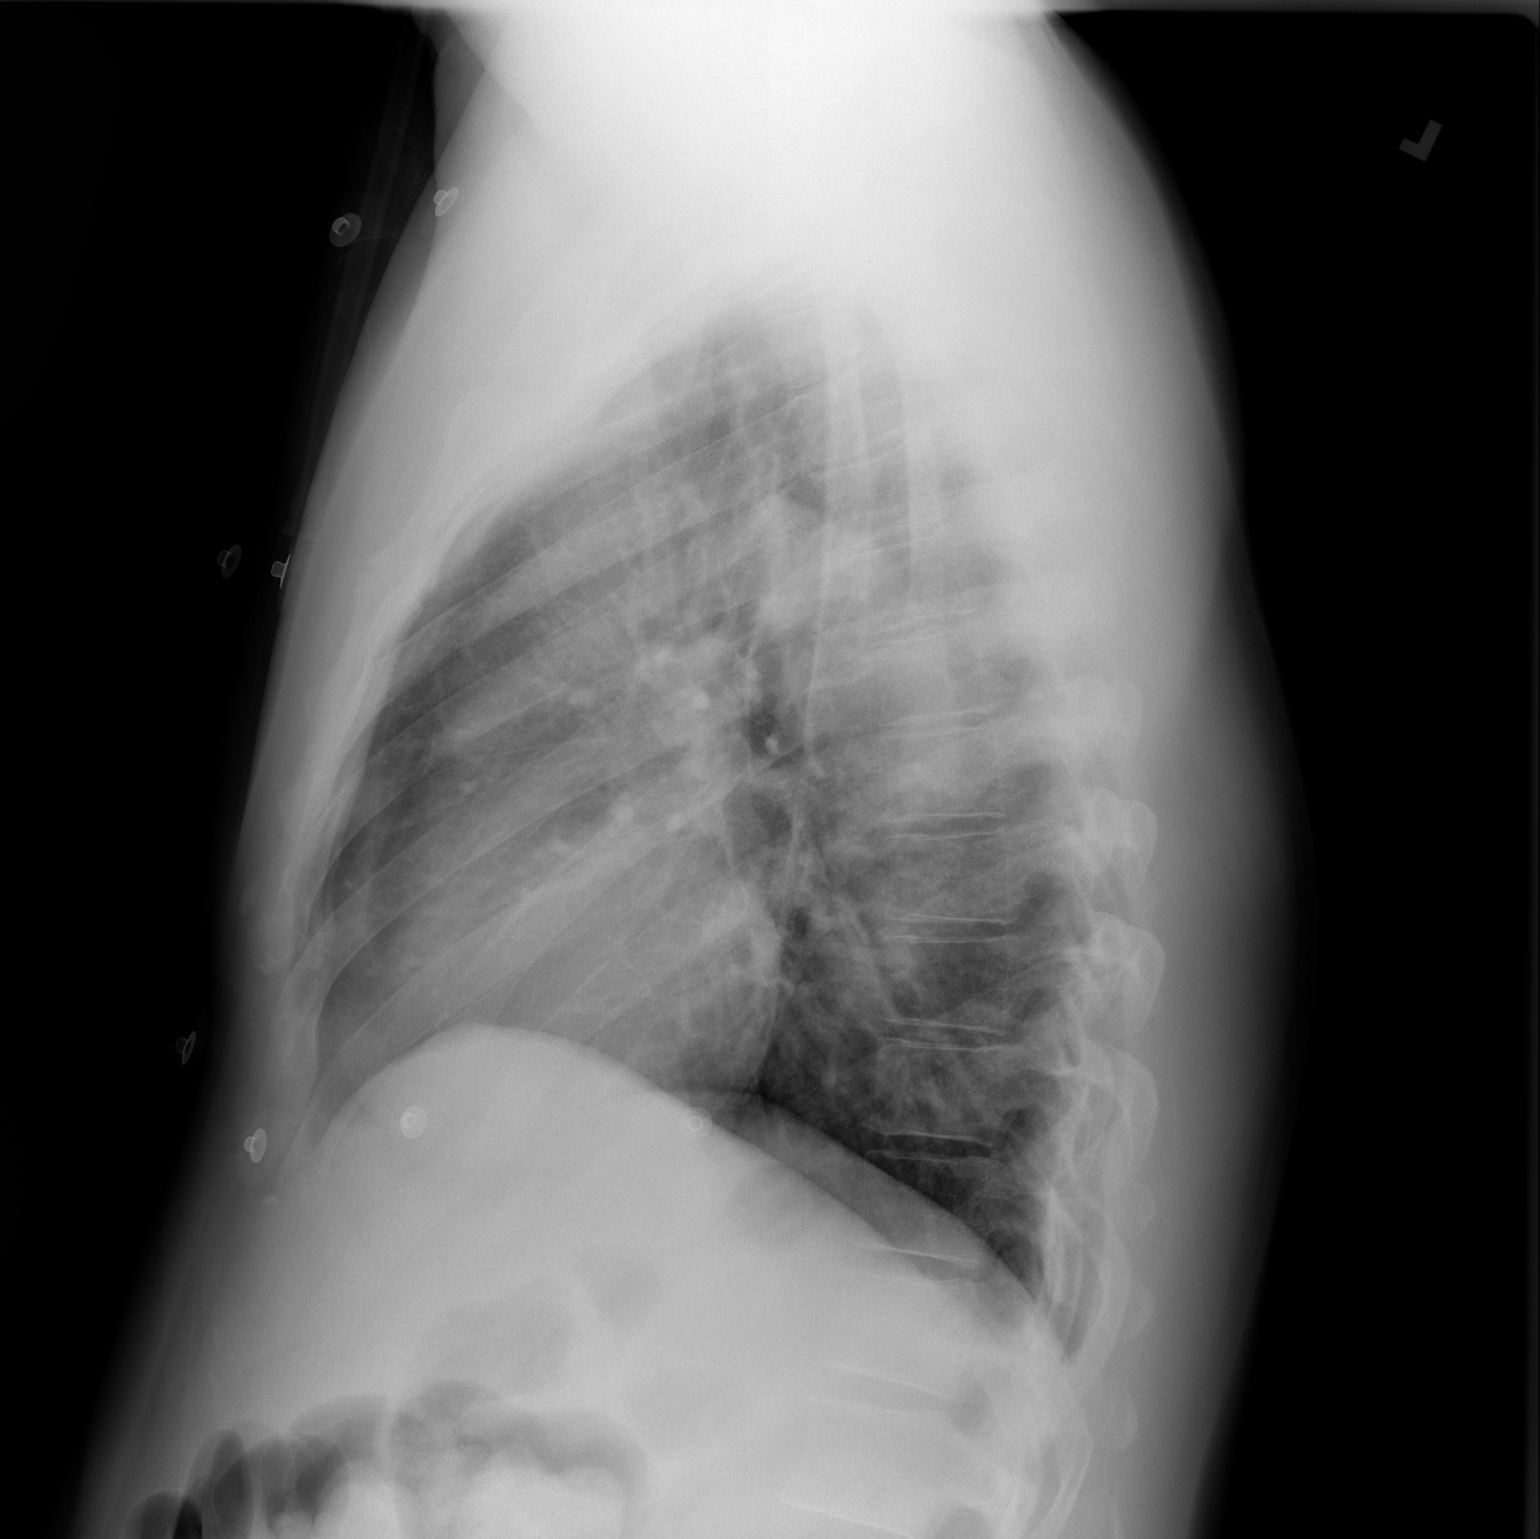

[2 of 2 positions shown; findings below may reference images not displayed]

FINDINGS: Cardiac silhouette is normal in size and configuration. Normal
mediastinal and hilar contours.

Clear lungs.  No pleural effusion or pneumothorax.

Skeletal structures are unremarkable.
IMPRESSION: No active cardiopulmonary disease.

## 2023-02-10 ENCOUNTER — Other Ambulatory Visit: Payer: Self-pay

## 2023-02-10 ENCOUNTER — Emergency Department (HOSPITAL_COMMUNITY)
Admission: EM | Admit: 2023-02-10 | Discharge: 2023-02-11 | Disposition: A | Discharge status: Home / Self Care | Payer: BLUE CROSS/BLUE SHIELD | Attending: Emergency Medicine | Admitting: Emergency Medicine

## 2023-02-10 ENCOUNTER — Encounter (HOSPITAL_COMMUNITY): Payer: Self-pay

## 2023-02-10 ENCOUNTER — Emergency Department (HOSPITAL_COMMUNITY): Payer: BLUE CROSS/BLUE SHIELD

## 2023-02-10 DIAGNOSIS — R519 Headache, unspecified: Secondary | ICD-10-CM | POA: Insufficient documentation

## 2023-02-10 DIAGNOSIS — S27321A Contusion of lung, unilateral, initial encounter: Secondary | ICD-10-CM | POA: Diagnosis not present

## 2023-02-10 DIAGNOSIS — S12691A Other nondisplaced fracture of seventh cervical vertebra, initial encounter for closed fracture: Secondary | ICD-10-CM | POA: Diagnosis not present

## 2023-02-10 DIAGNOSIS — S61211A Laceration without foreign body of left index finger without damage to nail, initial encounter: Secondary | ICD-10-CM | POA: Insufficient documentation

## 2023-02-10 DIAGNOSIS — I129 Hypertensive chronic kidney disease with stage 1 through stage 4 chronic kidney disease, or unspecified chronic kidney disease: Secondary | ICD-10-CM | POA: Diagnosis not present

## 2023-02-10 DIAGNOSIS — Z79899 Other long term (current) drug therapy: Secondary | ICD-10-CM | POA: Insufficient documentation

## 2023-02-10 DIAGNOSIS — S12591A Other nondisplaced fracture of sixth cervical vertebra, initial encounter for closed fracture: Secondary | ICD-10-CM | POA: Insufficient documentation

## 2023-02-10 DIAGNOSIS — S129XXA Fracture of neck, unspecified, initial encounter: Secondary | ICD-10-CM

## 2023-02-10 DIAGNOSIS — Z7982 Long term (current) use of aspirin: Secondary | ICD-10-CM | POA: Insufficient documentation

## 2023-02-10 DIAGNOSIS — S61209A Unspecified open wound of unspecified finger without damage to nail, initial encounter: Secondary | ICD-10-CM

## 2023-02-10 DIAGNOSIS — S199XXA Unspecified injury of neck, initial encounter: Secondary | ICD-10-CM | POA: Diagnosis present

## 2023-02-10 DIAGNOSIS — N1831 Chronic kidney disease, stage 3a: Secondary | ICD-10-CM | POA: Diagnosis not present

## 2023-02-10 DIAGNOSIS — Y92410 Unspecified street and highway as the place of occurrence of the external cause: Secondary | ICD-10-CM | POA: Diagnosis not present

## 2023-02-10 LAB — CBC WITH DIFFERENTIAL/PLATELET
Abs Immature Granulocytes: 0.03 10*3/uL (ref 0.00–0.07)
Basophils Absolute: 0.1 10*3/uL (ref 0.0–0.1)
Basophils Relative: 1 %
Eosinophils Absolute: 0.5 10*3/uL (ref 0.0–0.5)
Eosinophils Relative: 5 %
HCT: 45.1 % (ref 39.0–52.0)
Hemoglobin: 14.9 g/dL (ref 13.0–17.0)
Immature Granulocytes: 0 %
Lymphocytes Relative: 33 %
Lymphs Abs: 3.3 10*3/uL (ref 0.7–4.0)
MCH: 28.6 pg (ref 26.0–34.0)
MCHC: 33 g/dL (ref 30.0–36.0)
MCV: 86.6 fL (ref 80.0–100.0)
Monocytes Absolute: 0.6 10*3/uL (ref 0.1–1.0)
Monocytes Relative: 6 %
Neutro Abs: 5.4 10*3/uL (ref 1.7–7.7)
Neutrophils Relative %: 55 %
Platelets: 344 10*3/uL (ref 150–400)
RBC: 5.21 MIL/uL (ref 4.22–5.81)
RDW: 15.9 % — ABNORMAL HIGH (ref 11.5–15.5)
WBC: 10 10*3/uL (ref 4.0–10.5)
nRBC: 0 % (ref 0.0–0.2)

## 2023-02-10 LAB — COMPREHENSIVE METABOLIC PANEL
ALT: 20 U/L (ref 0–44)
AST: 17 U/L (ref 15–41)
Albumin: 4 g/dL (ref 3.5–5.0)
Alkaline Phosphatase: 59 U/L (ref 38–126)
Anion gap: 8 (ref 5–15)
BUN: 14 mg/dL (ref 6–20)
CO2: 25 mmol/L (ref 22–32)
Calcium: 9.1 mg/dL (ref 8.9–10.3)
Chloride: 105 mmol/L (ref 98–111)
Creatinine, Ser: 1.7 mg/dL — ABNORMAL HIGH (ref 0.61–1.24)
GFR, Estimated: 51 mL/min — ABNORMAL LOW (ref >60)
Glucose, Bld: 126 mg/dL — ABNORMAL HIGH (ref 70–99)
Potassium: 3.2 mmol/L — ABNORMAL LOW (ref 3.5–5.1)
Sodium: 138 mmol/L (ref 135–145)
Total Bilirubin: 0.5 mg/dL (ref <1.2)
Total Protein: 7.7 g/dL (ref 6.5–8.1)

## 2023-02-10 LAB — RAPID URINE DRUG SCREEN, HOSP PERFORMED
Amphetamines: NOT DETECTED
Barbiturates: NOT DETECTED
Benzodiazepines: NOT DETECTED
Cocaine: NOT DETECTED
Opiates: NOT DETECTED
Tetrahydrocannabinol: POSITIVE — AB

## 2023-02-10 LAB — ETHANOL: Alcohol, Ethyl (B): 159 mg/dL — ABNORMAL HIGH (ref <10)

## 2023-02-10 MED ORDER — LIDOCAINE HCL (PF) 1 % IJ SOLN
5.0000 mL | Freq: Once | INTRAMUSCULAR | Status: AC
  Administered 2023-02-11: 5 mL
  Filled 2023-02-10: qty 5
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
## 2023-02-11 ENCOUNTER — Emergency Department (HOSPITAL_COMMUNITY): Payer: BLUE CROSS/BLUE SHIELD

## 2023-02-11 MED ORDER — CLONIDINE HCL 0.3 MG PO TABS: 0.3000 mg | ORAL_TABLET | Freq: Two times a day (BID) | ORAL | 0 refills | Status: DC

## 2023-02-11 MED ORDER — HYDROCODONE-ACETAMINOPHEN 5-325 MG PO TABS: 1.0000 | ORAL_TABLET | ORAL | 0 refills | Status: DC | PRN

## 2023-02-11 MED ORDER — AMLODIPINE BESYLATE 5 MG PO TABS
10.0000 mg | ORAL_TABLET | Freq: Once | ORAL | Status: AC
  Administered 2023-02-11: 10 mg via ORAL
  Filled 2023-02-11: qty 2

## 2023-02-11 MED ORDER — IOHEXOL 350 MG/ML SOLN
75.0000 mL | Freq: Once | INTRAVENOUS | Status: AC | PRN
  Administered 2023-02-11: 75 mL via INTRAVENOUS

## 2023-02-11 MED ORDER — AMLODIPINE BESYLATE 10 MG PO TABS
10.0000 mg | ORAL_TABLET | Freq: Every day | ORAL | 0 refills | Status: DC
Start: 2023-02-11 — End: 2023-03-13

## 2023-02-11 MED ORDER — METHOCARBAMOL 500 MG PO TABS: 500.0000 mg | ORAL_TABLET | Freq: Two times a day (BID) | ORAL | 0 refills | Status: AC

## 2023-02-11 MED ORDER — CEPHALEXIN 500 MG PO CAPS: 500.0000 mg | ORAL_CAPSULE | Freq: Four times a day (QID) | ORAL | 0 refills | Status: AC

## 2023-02-11 MED ORDER — CARVEDILOL 12.5 MG PO TABS: 25.0000 mg | ORAL_TABLET | Freq: Two times a day (BID) | ORAL | Status: DC

## 2023-02-11 MED ORDER — CARVEDILOL 25 MG PO TABS: 25.0000 mg | ORAL_TABLET | Freq: Two times a day (BID) | ORAL | 0 refills | Status: DC

## 2023-02-11 NOTE — Discharge Instructions (Addendum)
Follow up with neurosurgery in 2 weeks. Wear cervical collar, you may remove to bathe.  Follow up with hand surgery for the cut tendon in your finger.   Follow up with your primary care provider to review today's results.  Take Keflex as prescribed to prevent infection in your finger. Take your blood pressure medications as prescribed.  Take Robaxin as prescribed for muscle spasms. Take Norco as prescribed for pain. Do NOT drive or drink alcohol while taking these medications.

## 2023-02-11 NOTE — Progress Notes (Signed)
Orthopedic Tech Progress Note Patient Details:  Russell Hernandez 1980/06/05 [id number]  Ortho Devices Type of Ortho Device: Finger splint Ortho Device/Splint Location: INDEX Ortho Device/Splint Interventions: Ordered, Adjustment, Application   Post Interventions Patient Tolerated: Well Instructions Provided: Care of device  Donald Pore 02/11/2023, 4:20 AM

## 2023-02-11 NOTE — ED Notes (Signed)
Patient transported to MRI 

## 2023-03-13 ENCOUNTER — Telehealth: Payer: Self-pay | Admitting: Cardiology

## 2023-03-13 MED ORDER — AMLODIPINE BESYLATE 10 MG PO TABS: 10.0000 mg | ORAL_TABLET | Freq: Every day | ORAL | 2 refills | Status: DC

## 2023-03-13 NOTE — Telephone Encounter (Signed)
*  STAT* If patient is at the pharmacy, call can be transferred to refill team.   1. Which medications need to be refilled? (please list name of each medication and dose if known)     amLODipine (NORVASC) 10 MG tablet    carvedilol (COREG) 25 MG tablet  cloNIDine (CATAPRES) 0.3 MG tablet    4. Which pharmacy/location (including street and city if local pharmacy) is medication to be sent to? [facility] - [place],  - [address]     5. Do they need a 30 day or 90 day supply? 90

## 2023-03-25 IMAGING — CR DG CHEST 2V
2 series · 2 of 2 positions shown · non-contrast
Comparison: 01/27/2021

CLINICAL DATA: Chest pain

EXAM:
CHEST - 2 VIEW

[w chest pa]
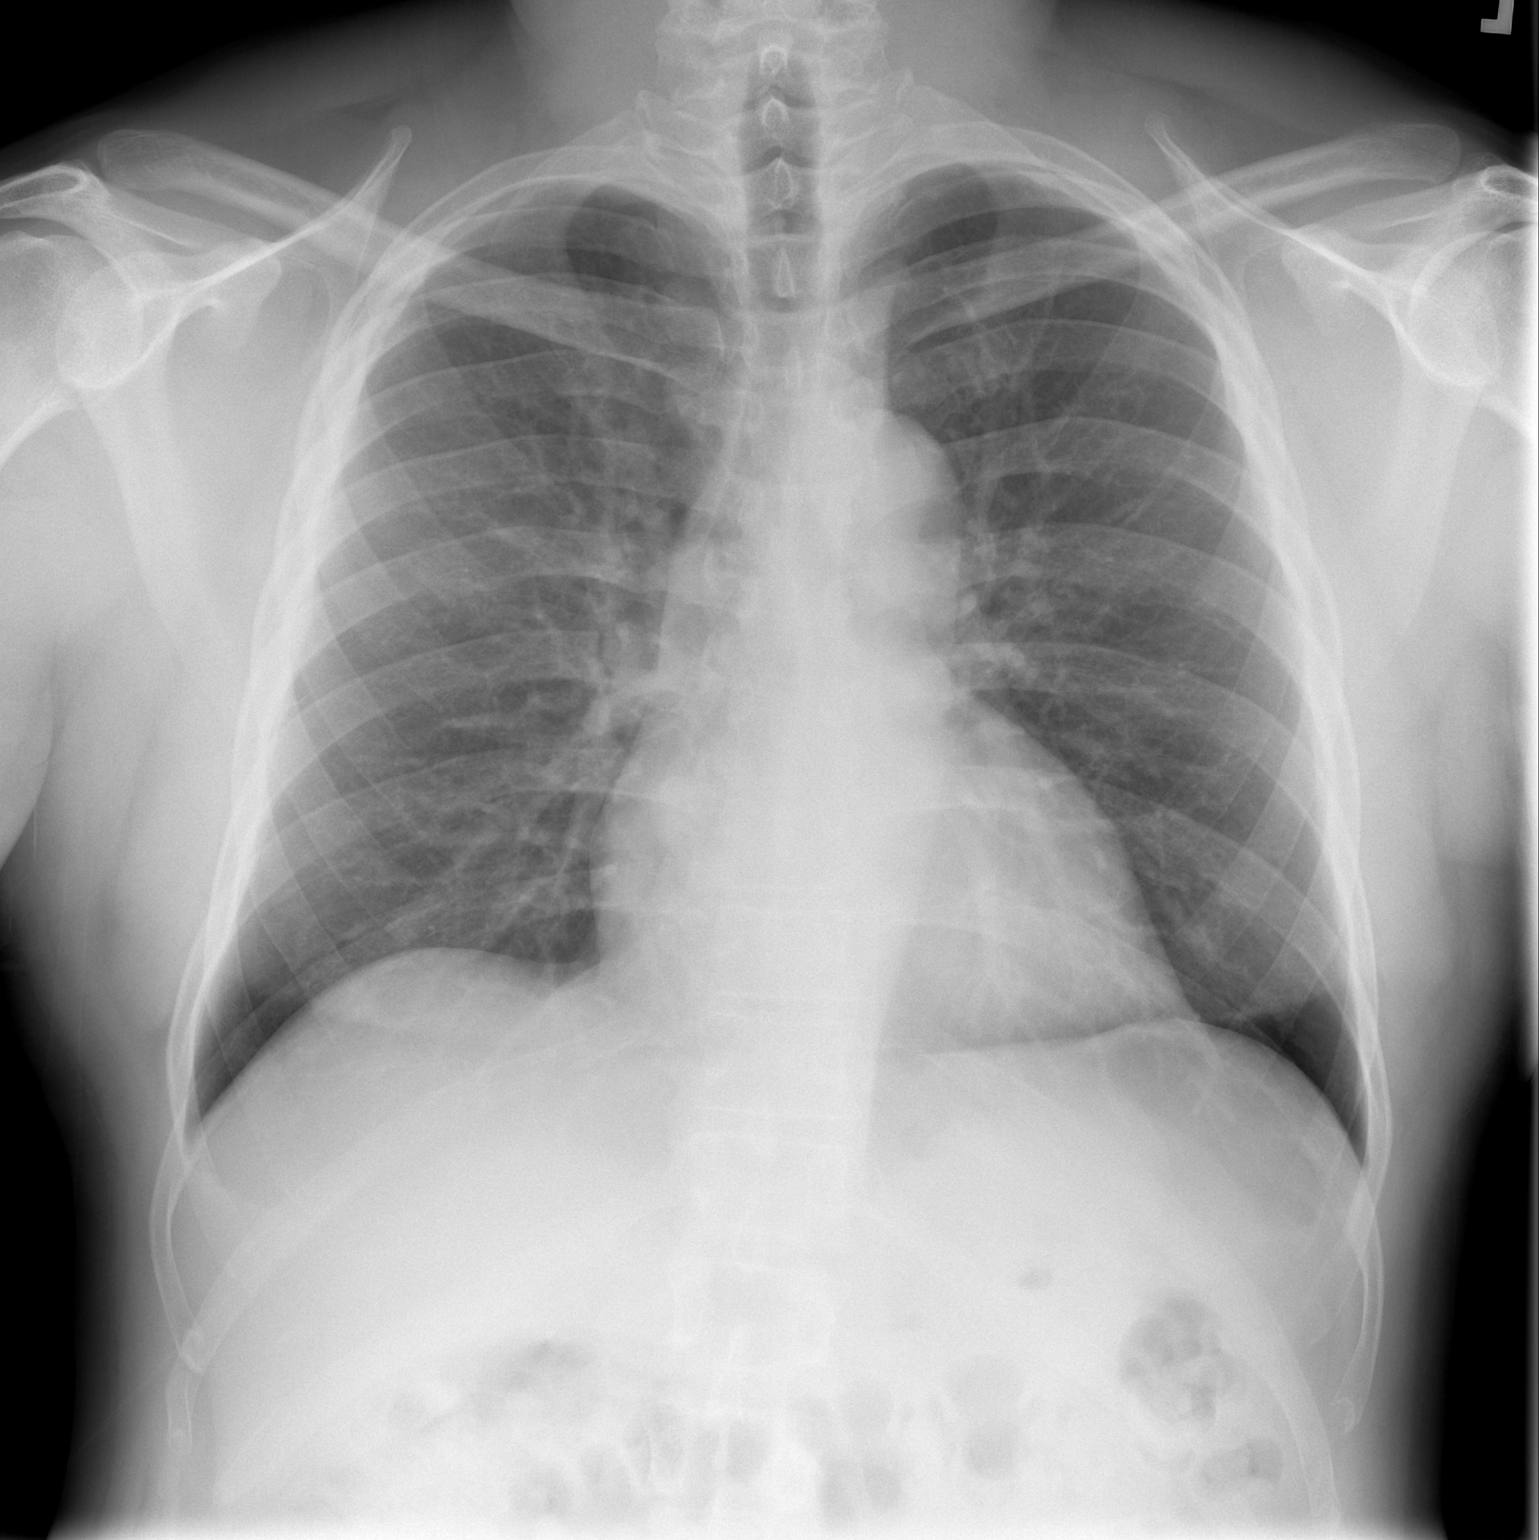

[w chest lat]
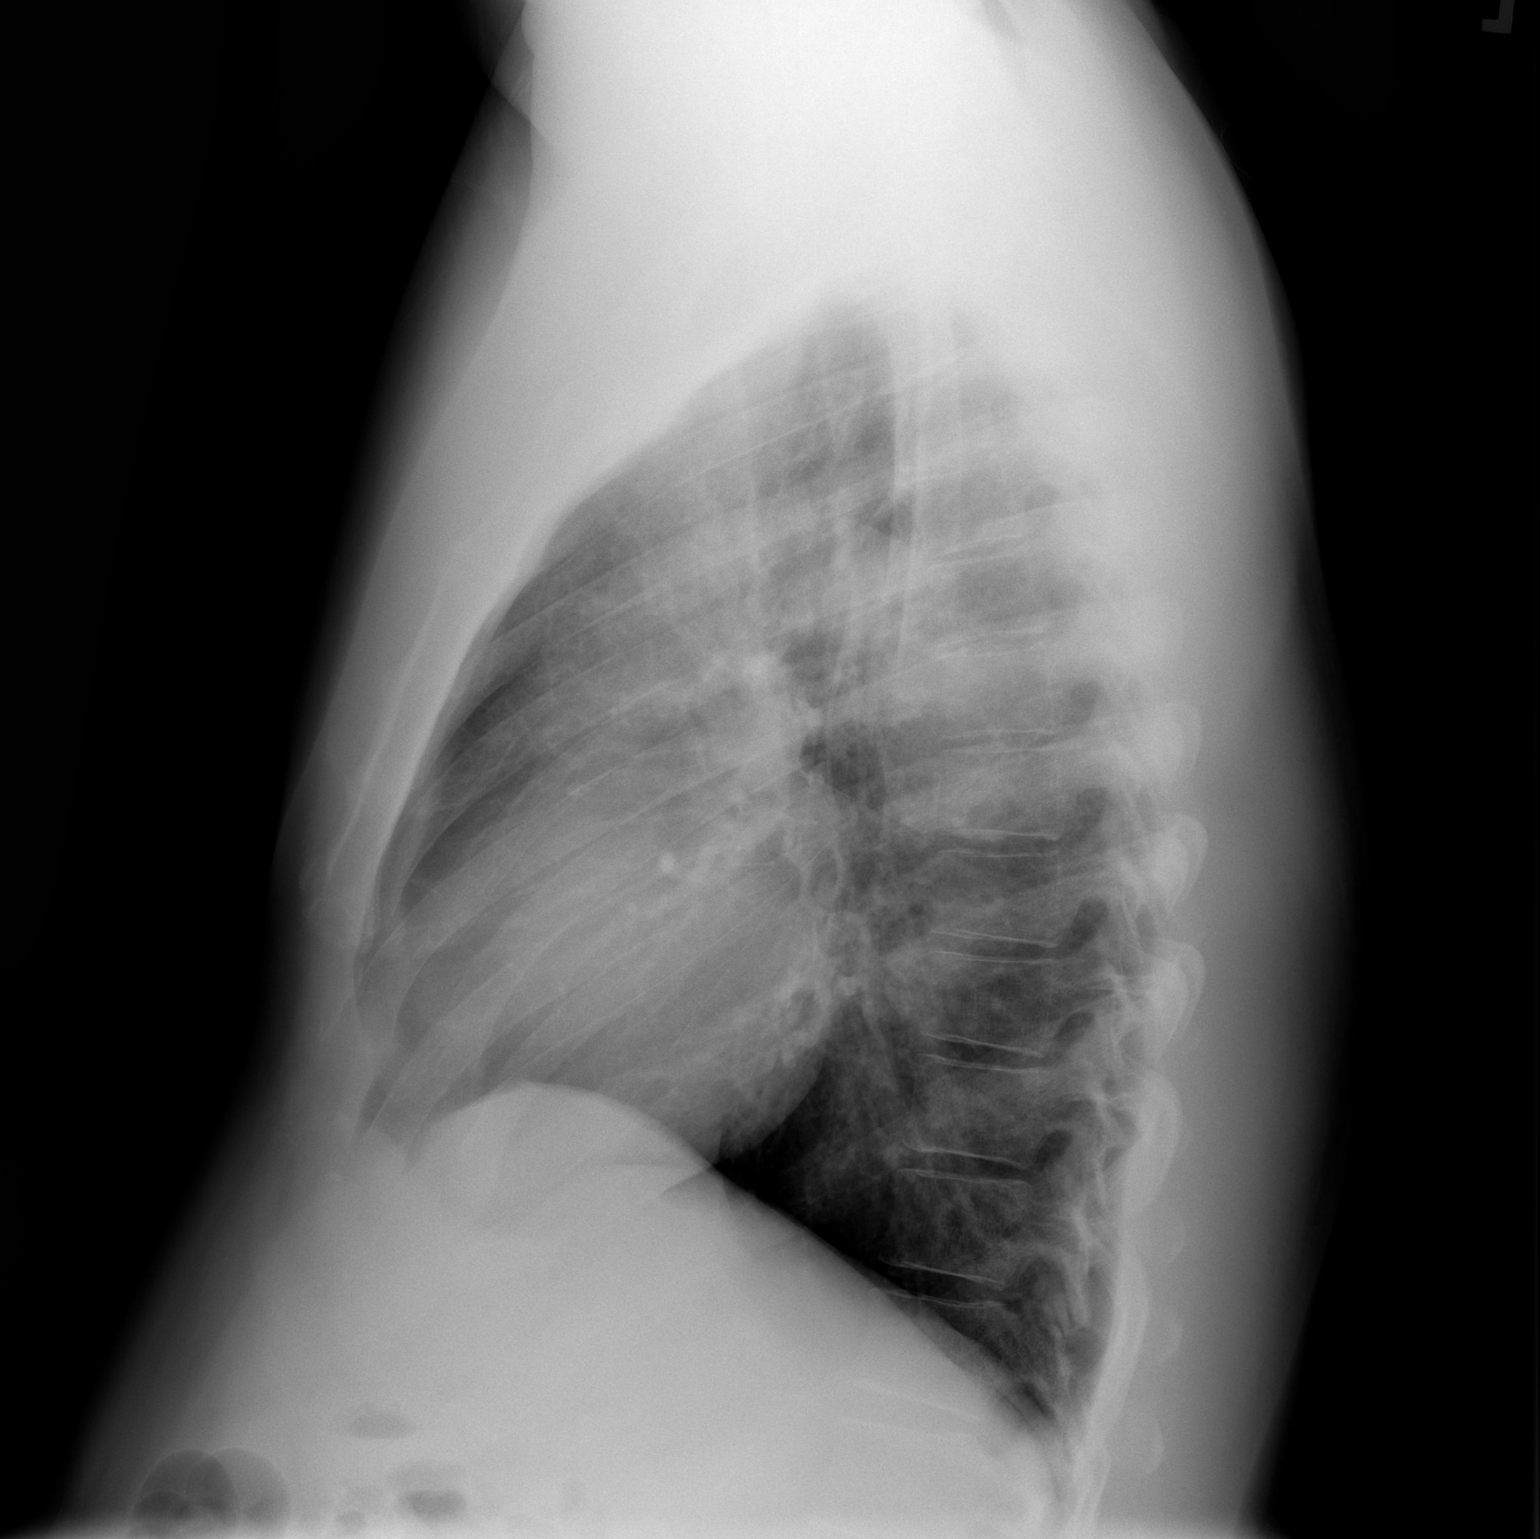

[2 of 2 positions shown; findings below may reference images not displayed]

FINDINGS: The heart size and mediastinal contours are within normal limits.
Both lungs are clear. The visualized skeletal structures are
unremarkable.
IMPRESSION: No active cardiopulmonary disease.

## 2023-11-16 ENCOUNTER — Telehealth: Payer: Self-pay | Admitting: Cardiology

## 2023-11-16 ENCOUNTER — Other Ambulatory Visit: Payer: Self-pay | Admitting: Cardiology

## 2023-11-16 DIAGNOSIS — I10 Essential (primary) hypertension: Secondary | ICD-10-CM

## 2023-11-16 MED ORDER — CLONIDINE HCL 0.3 MG PO TABS: 0.3000 mg | ORAL_TABLET | Freq: Two times a day (BID) | ORAL | 0 refills | Status: DC

## 2023-11-16 NOTE — Telephone Encounter (Signed)
 Pt's medication was sent to pt's pharmacy as requested. Confirmation received.

## 2023-11-16 NOTE — Telephone Encounter (Signed)
*  STAT* If patient is at the pharmacy, call can be transferred to refill team.   1. Which medications need to be refilled? (please list name of each medication and dose if known) cloNIDine  (CATAPRES ) 0.3 MG tablet   4. Which pharmacy/location (including street and city if local pharmacy) is medication to be sent to?  [facility] - [place], [place] - [address]     5. Do they need a 30 day or 90 day supply? 90   Scheduled 11/26

## 2024-01-12 ENCOUNTER — Other Ambulatory Visit: Payer: Self-pay | Admitting: Cardiology

## 2024-01-12 DIAGNOSIS — I10 Essential (primary) hypertension: Secondary | ICD-10-CM

## 2024-01-15 ENCOUNTER — Encounter: Payer: Self-pay | Admitting: Cardiology

## 2024-02-06 ENCOUNTER — Other Ambulatory Visit: Payer: Self-pay

## 2024-02-06 NOTE — Progress Notes (Deleted)
 Cardiology Office Note:    Date:  02/06/2024   ID:  Russell Hernandez, DOB 1980/12/10, MRN [id number]  PCP:  Celestia Rosaline SQUIBB, NP  Cardiologist:  Redell Leiter, MD    Referring MD: Celestia Rosaline SQUIBB, NP    ASSESSMENT:    1. Hypertensive heart disease without heart failure   2. Mild CAD   3. Stage 3a chronic kidney disease (HCC)   4. Mixed hyperlipidemia    PLAN:    In order of problems listed above:  ***   Next appointment: ***   Medication Adjustments/Labs and Tests Ordered: Current medicines are reviewed at length with the patient today.  Concerns regarding medicines are outlined above.  No orders of the defined types were placed in this encounter.  No orders of the defined types were placed in this encounter.    History of Present Illness:    Russell Hernandez is a 43 y.o. male with a hx of hypertensive heart disease without heart failure EF mildly reduced 45 to 50% mild CAD score 50th percentile less than 25% stenosis left anterior descending coronary artery stage III CKD and hyperlipidemia last seen 12/06/2022. Compliance with diet, lifestyle and medications: *** Past Medical History:  Diagnosis Date   AKI (acute kidney injury) 05/20/2019   Chest pain 09/17/2020   CKD (chronic kidney disease) 12/10/2021   Depressed left ventricular ejection fraction 06/13/2019   Dyspepsia 12/10/2021   Heartburn 12/10/2021   Hypertension    Hypertensive heart disease 05/19/2019   Hypertensive urgency 05/19/2019   Hypokalemia 05/20/2019   Mixed hyperlipidemia 10/23/2019   Stage 3a chronic kidney disease (HCC) 06/13/2019   TIA (transient ischemic attack) 05/21/2019   Tobacco use 01/17/2020    Current Medications: No outpatient medications have been marked as taking for the 02/07/24 encounter (Appointment) with Leiter Redell PARAS, MD.      EKGs/Labs/Other Studies Reviewed:    The following studies were reviewed today:  Cardiac Studies & Procedures    ______________________________________________________________________________________________     ECHOCARDIOGRAM  ECHOCARDIOGRAM COMPLETE 05/20/2019  Narrative ECHOCARDIOGRAM REPORT    Patient Name:   Russell Hernandez Date of Exam: 05/20/2019 Medical Rec #:  [id number]           Height:       73.0 in Accession #:    [id number]          Weight:       218.9 lb Date of Birth:  16-Aug-1980          BSA:          2.236 m Patient Age:    38 years            BP:           188/132 mmHg Patient Gender: M                   HR:           77 bpm. Exam Location:  Inpatient  Procedure: 2D Echo  Indications:    TIA  History:        Patient has no prior history of Echocardiogram examinations. Risk Factors:Hypertension.  Sonographer:    Augustin Seals RDCS (AE) Referring Phys: [id number] MIGNON DASEN GONFA  IMPRESSIONS   1. Mild global reduction in LV systolic function; moderate LVH; grade 1 diastolic dysfunction. 2. Left ventricular ejection fraction, by estimation, is 45 to 50%. The left ventricle has mildly decreased function. The left ventricle demonstrates global hypokinesis. There is moderate  left ventricular hypertrophy. Left ventricular diastolic parameters are consistent with Grade I diastolic dysfunction (impaired relaxation). 3. Right ventricular systolic function is normal. The right ventricular size is normal. 4. The mitral valve is normal in structure. Trivial mitral valve regurgitation. No evidence of mitral stenosis. 5. The aortic valve is normal in structure. Aortic valve regurgitation is not visualized. No aortic stenosis is present. 6. The inferior vena cava is normal in size with greater than 50% respiratory variability, suggesting right atrial pressure of 3 mmHg.  FINDINGS Left Ventricle: Left ventricular ejection fraction, by estimation, is 45 to 50%. The left ventricle has mildly decreased function. The left ventricle demonstrates global hypokinesis. The left ventricular  internal cavity size was normal in size. There is moderate left ventricular hypertrophy. Left ventricular diastolic parameters are consistent with Grade I diastolic dysfunction (impaired relaxation).  Right Ventricle: The right ventricular size is normal.Right ventricular systolic function is normal.  Left Atrium: Left atrial size was normal in size.  Right Atrium: Right atrial size was normal in size.  Pericardium: Trivial pericardial effusion is present.  Mitral Valve: The mitral valve is normal in structure. Normal mobility of the mitral valve leaflets. Trivial mitral valve regurgitation. No evidence of mitral valve stenosis.  Tricuspid Valve: The tricuspid valve is normal in structure. Tricuspid valve regurgitation is trivial. No evidence of tricuspid stenosis.  Aortic Valve: The aortic valve is normal in structure. Aortic valve regurgitation is not visualized. No aortic stenosis is present.  Pulmonic Valve: The pulmonic valve was normal in structure. Pulmonic valve regurgitation is not visualized. No evidence of pulmonic stenosis.  Aorta: The aortic root is normal in size and structure.  Venous: The inferior vena cava is normal in size with greater than 50% respiratory variability, suggesting right atrial pressure of 3 mmHg.  IAS/Shunts: No atrial level shunt detected by color flow Doppler.  Additional Comments: Mild global reduction in LV systolic function; moderate LVH; grade 1 diastolic dysfunction.   LEFT VENTRICLE PLAX 2D LVIDd:         5.00 cm  Diastology LVIDs:         3.60 cm  LV e' lateral:   6.48 cm/s LV PW:         1.40 cm  LV E/e' lateral: 9.4 LV IVS:        1.30 cm  LV e' medial:    4.85 cm/s LVOT diam:     2.50 cm  LV E/e' medial:  12.6 LV SV:         103 LV SV Index:   46 LVOT Area:     4.91 cm   RIGHT VENTRICLE             IVC RV Basal diam:  3.20 cm     IVC diam: 2.10 cm RV Mid diam:    3.70 cm RV S prime:     16.20 cm/s TAPSE (M-mode): 2.6  cm  LEFT ATRIUM             Index       RIGHT ATRIUM           Index LA diam:        4.50 cm 2.01 cm/m  RA Area:     16.00 cm LA Vol (A2C):   37.4 ml 16.73 ml/m RA Volume:   36.00 ml  16.10 ml/m LA Vol (A4C):   49.2 ml 22.00 ml/m LA Biplane Vol: 45.9 ml 20.53 ml/m AORTIC VALVE LVOT Vmax:   124.00  cm/s LVOT Vmean:  79.100 cm/s LVOT VTI:    0.210 m  AORTA Ao Root diam: 3.60 cm  MITRAL VALVE MV Area (PHT): 3.83 cm    SHUNTS MV Decel Time: 198 msec    Systemic VTI:  0.21 m MV E velocity: 60.90 cm/s  Systemic Diam: 2.50 cm MV A velocity: 69.00 cm/s MV E/A ratio:  0.88  Redell Shallow MD Electronically signed by Redell Shallow MD Signature Date/Time: 05/20/2019/3:29:32 PM    Final      CT SCANS  CT CORONARY MORPH W/CTA COR W/SCORE 03/19/2021  Narrative CLINICAL DATA:  43 year old male with chest pain.  EXAM: Cardiac/Coronary  CTA  TECHNIQUE: The patient was scanned on a [employer].  FINDINGS: A 100 kV prospective scan was triggered in the descending thoracic aorta at 111 HU's. Axial non-contrast 3 mm slices were carried out through the heart. The data set was analyzed on a dedicated work station and scored using the Agatson method. Gantry rotation speed was 250 msecs and collimation was .6 mm. 0.8 mg of sl NTG was given. The 3D data set was reconstructed in 5% intervals of the 67-82 % of the R-R cycle. Diastolic phases were analyzed on a dedicated work station using MPR, MIP and VRT modes. The patient received 80 cc of contrast.  Image quality: good  Aorta:  Normal size.  No calcifications.  No dissection.  Aortic Valve:  Trileaflet.  No calcifications.  Coronary Arteries:  Normal coronary origin.  Right dominance.  RCA is a large dominant artery that gives rise to PDA and PLA. There is no plaque.  Left main is a large artery that gives rise to LAD and LCX arteries.  LAD is a large vessel that has no plaque. There is small focal  mixed calcified plaque in the second diagonal branch, 0-24% stenosis.  LCX is a non-dominant artery that gives rise to one large OM1 branch. There is no plaque.  Other findings:  Normal pulmonary vein drainage into the left atrium.  Normal left atrial appendage without a thrombus.  Normal size of the pulmonary artery.  Please see radiology report for non cardiac findings. CTA of aorta done in sequence with this study showed no extra cardiac findings.  IMPRESSION: 1. Coronary calcium  score of 5.  2. Normal coronary origin with right dominance.  3. There is small focal mixed calcified plaque in the second diagonal branch, 0-24% stenosis.  CAD-RADS 1. Minimal non-obstructive CAD (0-24%). Consider non-atherosclerotic causes of chest pain. Consider preventive therapy and risk factor modification.   Electronically Signed By: Oneil Parchment M.D. On: 03/19/2021 14:35     ______________________________________________________________________________________________          Recent Labs: 02/10/2023: ALT 20; BUN 14; Creatinine, Ser 1.70; Hemoglobin 14.9; Platelets 344; Potassium 3.2; Sodium 138  Recent Lipid Panel    Component Value Date/Time   CHOL 176 12/06/2022 1637   TRIG 145 12/06/2022 1637   HDL 30 (L) 12/06/2022 1637   CHOLHDL 5.9 (H) 12/06/2022 1637   CHOLHDL 9.6 05/20/2019 0627   VLDL 35 05/20/2019 0627   LDLCALC 120 (H) 12/06/2022 1637    Physical Exam:    VS:  There were no vitals taken for this visit.    Wt Readings from Last 3 Encounters:  02/10/23 215 lb (97.5 kg)  12/08/22 220 lb (99.8 kg)  12/06/22 212 lb 9.6 oz (96.4 kg)     GEN: *** Well nourished, well developed in no acute distress HEENT: Normal  NECK: No JVD; No carotid bruits LYMPHATICS: No lymphadenopathy CARDIAC: ***RRR, no murmurs, rubs, gallops RESPIRATORY:  Clear to auscultation without rales, wheezing or rhonchi  ABDOMEN: Soft, non-tender, non-distended MUSCULOSKELETAL:  No edema;  No deformity  SKIN: Warm and dry NEUROLOGIC:  Alert and oriented x 3 PSYCHIATRIC:  Normal affect    Signed, Redell Leiter, MD  02/06/2024 4:58 PM     [facility]

## 2024-02-07 ENCOUNTER — Ambulatory Visit: Admitting: Cardiology

## 2024-02-07 ENCOUNTER — Telehealth: Payer: Self-pay | Admitting: Cardiology

## 2024-02-07 DIAGNOSIS — N1831 Chronic kidney disease, stage 3a: Secondary | ICD-10-CM

## 2024-02-07 DIAGNOSIS — E782 Mixed hyperlipidemia: Secondary | ICD-10-CM

## 2024-02-07 DIAGNOSIS — I2510 Atherosclerotic heart disease of native coronary artery without angina pectoris: Secondary | ICD-10-CM

## 2024-02-07 DIAGNOSIS — I119 Hypertensive heart disease without heart failure: Secondary | ICD-10-CM

## 2024-02-07 DIAGNOSIS — I10 Essential (primary) hypertension: Secondary | ICD-10-CM

## 2024-02-07 MED ORDER — CARVEDILOL 25 MG PO TABS: 25.0000 mg | ORAL_TABLET | Freq: Two times a day (BID) | ORAL | 0 refills | Status: DC

## 2024-02-07 MED ORDER — CLONIDINE HCL 0.3 MG PO TABS: 0.3000 mg | ORAL_TABLET | Freq: Two times a day (BID) | ORAL | 0 refills | Status: DC

## 2024-02-07 MED ORDER — AMLODIPINE BESYLATE 10 MG PO TABS: 10.0000 mg | ORAL_TABLET | Freq: Every day | ORAL | 0 refills | Status: DC

## 2024-02-07 NOTE — Telephone Encounter (Signed)
 Rx's sent to pharmacy.

## 2024-02-07 NOTE — Telephone Encounter (Signed)
*  STAT* If patient is at the pharmacy, call can be transferred to refill team.   1. Which medications need to be refilled? (please list name of each medication and dose if known) amLODipine  (NORVASC ) 10 MG tablet  cloNIDine  (CATAPRES ) 0.3 MG tablet   carvedilol  (COREG ) 25 MG tablet   2. Which pharmacy/location (including street and city if local pharmacy) is medication to be sent to? [facility] - [place], [place] - [address] Phone: [phone]  Fax: [phone]      3. Do they need a 30 day or 90 day supply? 1 Weeks worth pt had to move the appt.

## 2024-02-07 NOTE — Addendum Note (Signed)
 Addended by: DARIO IZETTA CROME on: 02/07/2024 03:58 PM   Modules accepted: Orders

## 2024-02-16 ENCOUNTER — Ambulatory Visit: Admitting: Cardiology

## 2024-03-18 DIAGNOSIS — I10 Essential (primary) hypertension: Secondary | ICD-10-CM

## 2024-03-20 NOTE — Progress Notes (Signed)
 " Cardiology Office Note:    Date:  03/21/2024   ID:  Russell Hernandez, DOB 1980/07/31, MRN [id number]  PCP:  Celestia Rosaline SQUIBB, NP  Cardiologist:  Redell Leiter, MD    Referring MD: Celestia Rosaline SQUIBB, NP    ASSESSMENT:    1. Hypertensive heart disease without heart failure   2. Mild CAD   3. Stage 3a chronic kidney disease (HCC)   4. Mixed hyperlipidemia   5. Essential hypertension    PLAN:    In order of problems listed above:  Russell Hernandez continues to do well with good self-management and multiple and hyper tensive medication's for resistant hypertension and he will continue his central active clonidine  twice daily without CNS side effects carvedilol  25 mg twice daily amlodipine  10 mg daily and hydrochlorothiazide  3 days a week Asymptomatic mild nonobstructive continue aspirin  and statin check lipid profile ApoB CMP today For his renal function today   Next appointment: Follow-up in 1 year   Medication Adjustments/Labs and Tests Ordered: Current medicines are reviewed at length with the patient today.  Concerns regarding medicines are outlined above.  Orders Placed This Encounter  Procedures   Apolipoprotein B   Lipid panel   Comprehensive metabolic panel with GFR   EKG [id number]   Meds ordered this encounter  Medications   carvedilol  (COREG ) 25 MG tablet    Sig: Take 1 tablet (25 mg total) by mouth 2 (two) times daily with a meal.    Dispense:  180 tablet    Refill:  3    Please to keep appt for 03/21/24 for additional refills.   amLODipine  (NORVASC ) 10 MG tablet    Sig: Take 1 tablet (10 mg total) by mouth daily.    Dispense:  30 tablet    Refill:  0   atorvastatin  (LIPITOR) 20 MG tablet    Sig: Take 1 tablet (20 mg total) by mouth daily.    Dispense:  90 tablet    Refill:  3   hydrochlorothiazide  (HYDRODIURIL ) 25 MG tablet    Sig: Take 3 tablets (75 mg total) by mouth 3 (three) times a week. MON, WED, FRI    Dispense:  30 tablet    Refill:  3    cloNIDine  (CATAPRES ) 0.3 MG tablet    Sig: Take 1 tablet (0.3 mg total) by mouth 2 (two) times daily.    Dispense:  180 tablet    Refill:  3     History of Present Illness:    Russell Hernandez is a 44 y.o. male with a hx of hypertensive heart disease with stage III CKD mildly reduced ejection fraction 45 to 50% mild nonobstructive CAD and hyperlipidemia last seen 12/06/2022.  He had a cardiac CTA reported January 2023 with a very low calcium  score 5 and minimal mixed calcific plaque and a diagonal branch 0 to 24% aorta was normal no calcification in the aortic valve.  Compliance with diet, lifestyle and medications: Yes  Russell Hernandez feels well employed full-time at an [employer] facility he has no exercise intolerance edema shortness of breath chest pain palpitation or syncope He asked how often he should take a thiazide diuretic and we settled on 3 days a week We will recheck his labs today for safety including CMP lipids With good technique validated device his blood pressure runs 128 130 70-80. Past Medical History:  Diagnosis Date   AKI (acute kidney injury) 05/20/2019   Chest pain 09/17/2020   CKD (chronic kidney disease) 12/10/2021  Depressed left ventricular ejection fraction 06/13/2019   Dyspepsia 12/10/2021   Heartburn 12/10/2021   Hypertension    Hypertensive heart disease 05/19/2019   Hypertensive urgency 05/19/2019   Hypokalemia 05/20/2019   Mixed hyperlipidemia 10/23/2019   Stage 3a chronic kidney disease (HCC) 06/13/2019   TIA (transient ischemic attack) 05/21/2019   Tobacco use 01/17/2020    Current Medications: Active Medications[1]    EKGs/Labs/Other Studies Reviewed:    The following studies were reviewed today:  Cardiac Studies & Procedures   ______________________________________________________________________________________________     ECHOCARDIOGRAM  ECHOCARDIOGRAM COMPLETE 05/20/2019  Narrative ECHOCARDIOGRAM REPORT    Patient Name:    Russell Hernandez Date of Exam: 05/20/2019 Medical Rec #:  [id number]           Height:       73.0 in Accession #:    [id number]          Weight:       218.9 lb Date of Birth:  [birth year]          BSA:          2.236 m Patient Age:    38 years            BP:           188/132 mmHg Patient Gender: M                   HR:           77 bpm. Exam Location:  Inpatient  Procedure: 2D Echo  Indications:    TIA  History:        Patient has no prior history of Echocardiogram examinations. Risk Factors:Hypertension.  Sonographer:    Augustin Seals RDCS (AE) Referring Phys: [id number] MIGNON DASEN GONFA  IMPRESSIONS   1. Mild global reduction in LV systolic function; moderate LVH; grade 1 diastolic dysfunction. 2. Left ventricular ejection fraction, by estimation, is 45 to 50%. The left ventricle has mildly decreased function. The left ventricle demonstrates global hypokinesis. There is moderate left ventricular hypertrophy. Left ventricular diastolic parameters are consistent with Grade I diastolic dysfunction (impaired relaxation). 3. Right ventricular systolic function is normal. The right ventricular size is normal. 4. The mitral valve is normal in structure. Trivial mitral valve regurgitation. No evidence of mitral stenosis. 5. The aortic valve is normal in structure. Aortic valve regurgitation is not visualized. No aortic stenosis is present. 6. The inferior vena cava is normal in size with greater than 50% respiratory variability, suggesting right atrial pressure of 3 mmHg.  FINDINGS Left Ventricle: Left ventricular ejection fraction, by estimation, is 45 to 50%. The left ventricle has mildly decreased function. The left ventricle demonstrates global hypokinesis. The left ventricular internal cavity size was normal in size. There is moderate left ventricular hypertrophy. Left ventricular diastolic parameters are consistent with Grade I diastolic dysfunction (impaired relaxation).  Right  Ventricle: The right ventricular size is normal.Right ventricular systolic function is normal.  Left Atrium: Left atrial size was normal in size.  Right Atrium: Right atrial size was normal in size.  Pericardium: Trivial pericardial effusion is present.  Mitral Valve: The mitral valve is normal in structure. Normal mobility of the mitral valve leaflets. Trivial mitral valve regurgitation. No evidence of mitral valve stenosis.  Tricuspid Valve: The tricuspid valve is normal in structure. Tricuspid valve regurgitation is trivial. No evidence of tricuspid stenosis.  Aortic Valve: The aortic valve is normal in structure. Aortic valve regurgitation is not visualized. No aortic stenosis  is present.  Pulmonic Valve: The pulmonic valve was normal in structure. Pulmonic valve regurgitation is not visualized. No evidence of pulmonic stenosis.  Aorta: The aortic root is normal in size and structure.  Venous: The inferior vena cava is normal in size with greater than 50% respiratory variability, suggesting right atrial pressure of 3 mmHg.  IAS/Shunts: No atrial level shunt detected by color flow Doppler.  Additional Comments: Mild global reduction in LV systolic function; moderate LVH; grade 1 diastolic dysfunction.   LEFT VENTRICLE PLAX 2D LVIDd:         5.00 cm  Diastology LVIDs:         3.60 cm  LV e' lateral:   6.48 cm/s LV PW:         1.40 cm  LV E/e' lateral: 9.4 LV IVS:        1.30 cm  LV e' medial:    4.85 cm/s LVOT diam:     2.50 cm  LV E/e' medial:  12.6 LV SV:         103 LV SV Index:   46 LVOT Area:     4.91 cm   RIGHT VENTRICLE             IVC RV Basal diam:  3.20 cm     IVC diam: 2.10 cm RV Mid diam:    3.70 cm RV S prime:     16.20 cm/s TAPSE (M-mode): 2.6 cm  LEFT ATRIUM             Index       RIGHT ATRIUM           Index LA diam:        4.50 cm 2.01 cm/m  RA Area:     16.00 cm LA Vol (A2C):   37.4 ml 16.73 ml/m RA Volume:   36.00 ml  16.10 ml/m LA Vol (A4C):    49.2 ml 22.00 ml/m LA Biplane Vol: 45.9 ml 20.53 ml/m AORTIC VALVE LVOT Vmax:   124.00 cm/s LVOT Vmean:  79.100 cm/s LVOT VTI:    0.210 m  AORTA Ao Root diam: 3.60 cm  MITRAL VALVE MV Area (PHT): 3.83 cm    SHUNTS MV Decel Time: 198 msec    Systemic VTI:  0.21 m MV E velocity: 60.90 cm/s  Systemic Diam: 2.50 cm MV A velocity: 69.00 cm/s MV E/A ratio:  0.88  Redell Shallow MD Electronically signed by Redell Shallow MD Signature Date/Time: 05/20/2019/3:29:32 PM    Final      CT SCANS  CT CORONARY MORPH W/CTA COR W/SCORE 03/19/2021  Narrative CLINICAL DATA:  44 year old male with chest pain.  EXAM: Cardiac/Coronary  CTA  TECHNIQUE: The patient was scanned on a [employer].  FINDINGS: A 100 kV prospective scan was triggered in the descending thoracic aorta at 111 HU's. Axial non-contrast 3 mm slices were carried out through the heart. The data set was analyzed on a dedicated work station and scored using the Agatson method. Gantry rotation speed was 250 msecs and collimation was .6 mm. 0.8 mg of sl NTG was given. The 3D data set was reconstructed in 5% intervals of the 67-82 % of the R-R cycle. Diastolic phases were analyzed on a dedicated work station using MPR, MIP and VRT modes. The patient received 80 cc of contrast.  Image quality: good  Aorta:  Normal size.  No calcifications.  No dissection.  Aortic Valve:  Trileaflet.  No calcifications.  Coronary Arteries:  Normal coronary origin.  Right dominance.  RCA is a large dominant artery that gives rise to PDA and PLA. There is no plaque.  Left main is a large artery that gives rise to LAD and LCX arteries.  LAD is a large vessel that has no plaque. There is small focal mixed calcified plaque in the second diagonal branch, 0-24% stenosis.  LCX is a non-dominant artery that gives rise to one large OM1 branch. There is no plaque.  Other findings:  Normal pulmonary vein drainage into the  left atrium.  Normal left atrial appendage without a thrombus.  Normal size of the pulmonary artery.  Please see radiology report for non cardiac findings. CTA of aorta done in sequence with this study showed no extra cardiac findings.  IMPRESSION: 1. Coronary calcium  score of 5.  2. Normal coronary origin with right dominance.  3. There is small focal mixed calcified plaque in the second diagonal branch, 0-24% stenosis.  CAD-RADS 1. Minimal non-obstructive CAD (0-24%). Consider non-atherosclerotic causes of chest pain. Consider preventive therapy and risk factor modification.   Electronically Signed By: Oneil Parchment M.D. On: 03/19/2021 14:35     ______________________________________________________________________________________________      EKG Interpretation Date/Time:  Thursday March 21 2024 15:33:08 EST Ventricular Rate:  64 PR Interval:  164 QRS Duration:  100 QT Interval:  412 QTC Calculation: 425 R Axis:   -32  Text Interpretation: Normal sinus rhythm Left axis deviation When compared with ECG of 13-Feb-2022 19:45, No significant change since last tracing Confirmed by Monetta Rogue ([id number]) on 03/21/2024 3:35:47 PM   Recent Labs: No results found for requested labs within last 365 days.  Recent Lipid Panel    Component Value Date/Time   CHOL 176 12/06/2022 1637   TRIG 145 12/06/2022 1637   HDL 30 (L) 12/06/2022 1637   CHOLHDL 5.9 (H) 12/06/2022 1637   CHOLHDL 9.6 05/20/2019 0627   VLDL 35 05/20/2019 0627   LDLCALC 120 (H) 12/06/2022 1637    Physical Exam:    VS:  BP (!) 142/88   Pulse 64   Ht 6' 1 (1.854 m)   Wt 210 lb 3.2 oz (95.3 kg)   SpO2 97%   BMI 27.73 kg/m     Wt Readings from Last 3 Encounters:  03/21/24 210 lb 3.2 oz (95.3 kg)  02/10/23 215 lb (97.5 kg)  12/08/22 220 lb (99.8 kg)     GEN:  Well nourished, well developed in no acute distress HEENT: Normal NECK: No JVD; No carotid bruits LYMPHATICS: No  lymphadenopathy CARDIAC: RRR, no murmurs, rubs, gallops RESPIRATORY:  Clear to auscultation without rales, wheezing or rhonchi  ABDOMEN: Soft, non-tender, non-distended MUSCULOSKELETAL:  No edema; No deformity  SKIN: Warm and dry NEUROLOGIC:  Alert and oriented x 3 PSYCHIATRIC:  Normal affect    Signed, Rogue Monetta, MD  03/21/2024 4:41 PM     [facility]      [1]  Current Meds  Medication Sig   aspirin  325 MG tablet Take 325 mg by mouth daily.   famotidine  (PEPCID ) 20 MG tablet Take 20 mg by mouth at bedtime.   Iron , Ferrous Sulfate , 325 (65 Fe) MG TABS Take 325 mg by mouth daily.   methocarbamol  (ROBAXIN ) 500 MG tablet Take 1 tablet (500 mg total) by mouth 2 (two) times daily.   omeprazole  (PRILOSEC) 20 MG capsule Take 20 mg by mouth daily.   potassium chloride  SA (KLOR-CON ) 20 MEQ tablet Take 1  tablet (20 mEq total) by mouth daily.   [DISCONTINUED] amLODipine  (NORVASC ) 10 MG tablet Take 1 tablet (10 mg total) by mouth daily.   [DISCONTINUED] atorvastatin  (LIPITOR) 20 MG tablet Take 1 tablet (20 mg total) by mouth daily.   [DISCONTINUED] carvedilol  (COREG ) 25 MG tablet Take 1 tablet (25 mg total) by mouth 2 (two) times daily with a meal. Please keep appt for 03/21/24 with Dr. Monetta for additional refills.   [DISCONTINUED] cloNIDine  (CATAPRES ) 0.3 MG tablet Take 1 tablet (0.3 mg total) by mouth 2 (two) times daily.   [DISCONTINUED] hydrochlorothiazide  (HYDRODIURIL ) 25 MG tablet Take 75 mg by mouth 3 (three) times daily as needed for edema or high blood pressure.   "

## 2024-03-21 ENCOUNTER — Ambulatory Visit: Attending: Cardiology | Admitting: Cardiology

## 2024-03-21 ENCOUNTER — Encounter: Payer: Self-pay | Admitting: Cardiology

## 2024-03-21 VITALS — BP 142/88 | HR 64 | Ht 73.0 in | Wt 210.2 lb

## 2024-03-21 DIAGNOSIS — I2510 Atherosclerotic heart disease of native coronary artery without angina pectoris: Secondary | ICD-10-CM

## 2024-03-21 DIAGNOSIS — I119 Hypertensive heart disease without heart failure: Secondary | ICD-10-CM | POA: Diagnosis not present

## 2024-03-21 DIAGNOSIS — E782 Mixed hyperlipidemia: Secondary | ICD-10-CM

## 2024-03-21 DIAGNOSIS — I10 Essential (primary) hypertension: Secondary | ICD-10-CM | POA: Diagnosis present

## 2024-03-21 DIAGNOSIS — N1831 Chronic kidney disease, stage 3a: Secondary | ICD-10-CM | POA: Diagnosis not present

## 2024-03-21 MED ORDER — AMLODIPINE BESYLATE 10 MG PO TABS: 10.0000 mg | ORAL_TABLET | Freq: Every day | ORAL | 0 refills | Status: AC

## 2024-03-21 MED ORDER — HYDROCHLOROTHIAZIDE 25 MG PO TABS: 75.0000 mg | ORAL_TABLET | ORAL | 3 refills | Status: AC

## 2024-03-21 MED ORDER — ATORVASTATIN CALCIUM 20 MG PO TABS: 20.0000 mg | ORAL_TABLET | Freq: Every day | ORAL | 3 refills | Status: AC

## 2024-03-21 MED ORDER — CLONIDINE HCL 0.3 MG PO TABS: 0.3000 mg | ORAL_TABLET | Freq: Two times a day (BID) | ORAL | 3 refills | Status: AC

## 2024-03-21 MED ORDER — CARVEDILOL 25 MG PO TABS: 25.0000 mg | ORAL_TABLET | Freq: Two times a day (BID) | ORAL | 3 refills | Status: AC

## 2024-03-21 NOTE — Patient Instructions (Addendum)
 Medication Instructions:   TAKE: hydrochlorothiazide  - Monday- Wed- Friday   Lab Work: Lipid, APO-B, CMP- today If you have labs (blood work) drawn today and your tests are completely normal, you will receive your results only by: MyChart Message (if you have MyChart) OR A paper copy in the mail If you have any lab test that is abnormal or we need to change your treatment, we will call you to review the results.   Testing/Procedures: None Ordered   Follow-Up: At [facility], you and your health needs are our priority.  As part of our continuing mission to provide you with exceptional heart care, we have created designated Provider Care Teams.  These Care Teams include your primary Cardiologist (physician) and Advanced Practice Providers (APPs -  Physician Assistants and Nurse Practitioners) who all work together to provide you with the care you need, when you need it.  We recommend signing up for the patient portal called MyChart.  Sign up information is provided on this After Visit Summary.  MyChart is used to connect with patients for Virtual Visits (Telemedicine).  Patients are able to view lab/test results, encounter notes, upcoming appointments, etc.  Non-urgent messages can be sent to your provider as well.   To learn more about what you can do with MyChart, go to [email or web address].    Your next appointment:   12 month(s)  The format for your next appointment:   In Person  Provider:   Redell Leiter, MD    Other Instructions NA

## 2024-03-22 ENCOUNTER — Ambulatory Visit: Payer: Self-pay | Admitting: Cardiology

## 2024-03-22 DIAGNOSIS — R931 Abnormal findings on diagnostic imaging of heart and coronary circulation: Secondary | ICD-10-CM

## 2024-03-22 DIAGNOSIS — E782 Mixed hyperlipidemia: Secondary | ICD-10-CM

## 2024-03-22 LAB — COMPREHENSIVE METABOLIC PANEL WITH GFR
ALT: 13 IU/L (ref 0–44)
AST: 9 IU/L (ref 0–40)
Albumin: 4.3 g/dL (ref 4.1–5.1)
Alkaline Phosphatase: 68 IU/L (ref 47–123)
BUN/Creatinine Ratio: 10 (ref 9–20)
BUN: 15 mg/dL (ref 6–24)
Bilirubin Total: 0.5 mg/dL (ref 0.0–1.2)
CO2: 22 mmol/L (ref 20–29)
Calcium: 9.4 mg/dL (ref 8.7–10.2)
Chloride: 103 mmol/L (ref 96–106)
Creatinine, Ser: 1.51 mg/dL — ABNORMAL HIGH (ref 0.76–1.27)
Globulin, Total: 3.1 g/dL (ref 1.5–4.5)
Glucose: 103 mg/dL — ABNORMAL HIGH (ref 70–99)
Potassium: 4 mmol/L (ref 3.5–5.2)
Sodium: 140 mmol/L (ref 134–144)
Total Protein: 7.4 g/dL (ref 6.0–8.5)
eGFR: 58 mL/min/1.73 — ABNORMAL LOW

## 2024-03-22 LAB — LIPID PANEL
Chol/HDL Ratio: 5.1 ratio — ABNORMAL HIGH (ref 0.0–5.0)
Cholesterol, Total: 185 mg/dL (ref 100–199)
HDL: 36 mg/dL — ABNORMAL LOW
LDL Chol Calc (NIH): 103 mg/dL — ABNORMAL HIGH (ref 0–99)
Triglycerides: 268 mg/dL — ABNORMAL HIGH (ref 0–149)
VLDL Cholesterol Cal: 46 mg/dL — ABNORMAL HIGH (ref 5–40)

## 2024-03-22 LAB — APOLIPOPROTEIN B: Apolipoprotein B: 101 mg/dL — ABNORMAL HIGH

## 2024-03-22 MED ORDER — EZETIMIBE 10 MG PO TABS: 10.0000 mg | ORAL_TABLET | Freq: Every day | ORAL | 3 refills | Status: AC

## 2024-03-25 ENCOUNTER — Other Ambulatory Visit: Payer: Self-pay

## 2024-03-26 ENCOUNTER — Other Ambulatory Visit: Payer: Self-pay
